# Patient Record
Sex: Female | Born: 1965 | Race: White | Hispanic: No | Marital: Single | Smoking: Former smoker
Health system: Southern US, Community
[De-identification: ages and names within clinical notes are randomized; demographics above are authoritative.]

## PROBLEM LIST (undated history)

## (undated) DIAGNOSIS — D259 Leiomyoma of uterus, unspecified: Secondary | ICD-10-CM

## (undated) DIAGNOSIS — E538 Deficiency of other specified B group vitamins: Secondary | ICD-10-CM

## (undated) DIAGNOSIS — G4730 Sleep apnea, unspecified: Secondary | ICD-10-CM

## (undated) DIAGNOSIS — I10 Essential (primary) hypertension: Secondary | ICD-10-CM

## (undated) DIAGNOSIS — J449 Chronic obstructive pulmonary disease, unspecified: Secondary | ICD-10-CM

## (undated) DIAGNOSIS — E785 Hyperlipidemia, unspecified: Secondary | ICD-10-CM

## (undated) HISTORY — DX: Hyperlipidemia, unspecified: E78.5

## (undated) HISTORY — DX: Leiomyoma of uterus, unspecified: D25.9

## (undated) HISTORY — PX: CHOLECYSTECTOMY: SHX55

## (undated) HISTORY — PX: HEMORROIDECTOMY: SUR656

## (undated) HISTORY — DX: Deficiency of other specified B group vitamins: E53.8

## (undated) HISTORY — DX: Chronic obstructive pulmonary disease, unspecified: J44.9

## (undated) HISTORY — PX: TENDON REPAIR: SHX5111

## (undated) HISTORY — PX: OTHER SURGICAL HISTORY: SHX169

## (undated) HISTORY — PX: ABDOMINAL HYSTERECTOMY: SHX81

---

## 1998-02-03 ENCOUNTER — Encounter: Payer: Self-pay | Admitting: Family Medicine

## 1998-02-03 ENCOUNTER — Inpatient Hospital Stay (HOSPITAL_COMMUNITY): Admission: AD | Admit: 1998-02-03 | Discharge: 1998-02-03 | Payer: Self-pay | Admitting: Family Medicine

## 2000-12-17 ENCOUNTER — Other Ambulatory Visit: Admission: RE | Admit: 2000-12-17 | Discharge: 2000-12-17 | Payer: Self-pay | Admitting: Family Medicine

## 2005-04-23 ENCOUNTER — Other Ambulatory Visit: Admission: RE | Admit: 2005-04-23 | Discharge: 2005-04-23 | Payer: Self-pay | Admitting: Family Medicine

## 2005-08-28 ENCOUNTER — Ambulatory Visit (HOSPITAL_COMMUNITY): Admission: RE | Admit: 2005-08-28 | Discharge: 2005-08-28 | Payer: Self-pay | Admitting: Family Medicine

## 2006-04-09 ENCOUNTER — Ambulatory Visit (HOSPITAL_COMMUNITY): Admission: RE | Admit: 2006-04-09 | Discharge: 2006-04-09 | Payer: Self-pay | Admitting: Family Medicine

## 2006-05-20 ENCOUNTER — Other Ambulatory Visit: Admission: RE | Admit: 2006-05-20 | Discharge: 2006-05-20 | Payer: Self-pay | Admitting: Family Medicine

## 2006-12-17 ENCOUNTER — Ambulatory Visit (HOSPITAL_COMMUNITY): Admission: RE | Admit: 2006-12-17 | Discharge: 2006-12-17 | Payer: Self-pay | Admitting: Family Medicine

## 2008-02-23 ENCOUNTER — Ambulatory Visit (HOSPITAL_COMMUNITY): Admission: RE | Admit: 2008-02-23 | Discharge: 2008-02-23 | Payer: Self-pay | Admitting: Family Medicine

## 2010-04-02 ENCOUNTER — Ambulatory Visit (HOSPITAL_COMMUNITY): Admission: RE | Admit: 2010-04-02 | Payer: Self-pay | Admitting: Family Medicine

## 2010-05-26 ENCOUNTER — Encounter: Payer: Self-pay | Admitting: Family Medicine

## 2010-05-27 ENCOUNTER — Encounter: Payer: Self-pay | Admitting: Family Medicine

## 2011-02-09 ENCOUNTER — Inpatient Hospital Stay (HOSPITAL_COMMUNITY): Admit: 2011-02-09 | Payer: BC Managed Care – PPO

## 2011-02-09 ENCOUNTER — Emergency Department (HOSPITAL_COMMUNITY)
Admit: 2011-02-09 | Discharge: 2011-02-09 | Disposition: A | Discharge status: Home / Self Care | Payer: BC Managed Care – PPO | Attending: Emergency Medicine | Admitting: Emergency Medicine

## 2011-02-09 ENCOUNTER — Encounter: Payer: Self-pay | Admitting: *Deleted

## 2011-02-09 ENCOUNTER — Emergency Department (HOSPITAL_COMMUNITY): Payer: BC Managed Care – PPO

## 2011-02-09 ENCOUNTER — Emergency Department (HOSPITAL_COMMUNITY)
Admission: EM | Admit: 2011-02-09 | Discharge: 2011-02-09 | Disposition: A | Discharge status: Home / Self Care | Payer: BC Managed Care – PPO | Attending: Emergency Medicine | Admitting: Emergency Medicine

## 2011-02-09 DIAGNOSIS — R110 Nausea: Secondary | ICD-10-CM | POA: Insufficient documentation

## 2011-02-09 DIAGNOSIS — R109 Unspecified abdominal pain: Secondary | ICD-10-CM | POA: Insufficient documentation

## 2011-02-09 HISTORY — DX: Essential (primary) hypertension: I10

## 2011-02-09 LAB — DIFFERENTIAL
Basophils Absolute: 0 10*3/uL (ref 0.0–0.1)
Basophils Relative: 0 % (ref 0–1)
Eosinophils Relative: 1 % (ref 0–5)
Lymphocytes Relative: 9 % — ABNORMAL LOW (ref 12–46)
Lymphs Abs: 1.3 10*3/uL (ref 0.7–4.0)
Monocytes Absolute: 0.7 10*3/uL (ref 0.1–1.0)
Monocytes Relative: 5 % (ref 3–12)
Neutrophils Relative %: 85 % — ABNORMAL HIGH (ref 43–77)

## 2011-02-09 LAB — COMPREHENSIVE METABOLIC PANEL
ALT: 23 U/L (ref 0–35)
Albumin: 3.5 g/dL (ref 3.5–5.2)
Alkaline Phosphatase: 92 U/L (ref 39–117)
CO2: 26 mEq/L (ref 19–32)
Chloride: 96 mEq/L (ref 96–112)
Potassium: 3.9 mEq/L (ref 3.5–5.1)
Total Bilirubin: 0.2 mg/dL — ABNORMAL LOW (ref 0.3–1.2)
Total Protein: 7.5 g/dL (ref 6.0–8.3)

## 2011-02-09 LAB — URINALYSIS, ROUTINE W REFLEX MICROSCOPIC
Bilirubin Urine: NEGATIVE
Hgb urine dipstick: NEGATIVE
Specific Gravity, Urine: >1.03 — ABNORMAL HIGH (ref 1.005–1.030)

## 2011-02-09 LAB — CBC
MCH: 29 pg (ref 26.0–34.0)
MCHC: 32.1 g/dL (ref 30.0–36.0)
RDW: 21.1 % — ABNORMAL HIGH (ref 11.5–15.5)

## 2011-02-09 MED ORDER — PROMETHAZINE HCL 25 MG PO TABS: 12.5000 mg | ORAL_TABLET | Freq: Four times a day (QID) | ORAL | Status: DC | PRN

## 2011-02-09 MED ORDER — HYDROCODONE-ACETAMINOPHEN 5-325 MG PO TABS: 2.0000 | ORAL_TABLET | ORAL | Status: AC | PRN

## 2011-02-09 MED ORDER — HYDROMORPHONE HCL 1 MG/ML IJ SOLN
1.0000 mg | Freq: Once | INTRAMUSCULAR | Status: AC
  Administered 2011-02-09: 1 mg via INTRAVENOUS
  Filled 2011-02-09: qty 1

## 2011-02-09 MED ORDER — ONDANSETRON HCL 4 MG/2ML IJ SOLN
4.0000 mg | Freq: Once | INTRAMUSCULAR | Status: AC
  Administered 2011-02-09: 4 mg via INTRAVENOUS
  Filled 2011-02-09: qty 2

## 2011-02-09 MED ORDER — ONDANSETRON HCL 4 MG/2ML IJ SOLN
4.0000 mg | Freq: Once | INTRAMUSCULAR | Status: AC
  Administered 2011-02-09: 4 mg via INTRAVENOUS

## 2011-02-09 MED ORDER — ONDANSETRON HCL 4 MG/2ML IJ SOLN
INTRAMUSCULAR | Status: AC
  Filled 2011-02-09: qty 2

## 2011-02-09 MED ORDER — SODIUM CHLORIDE 0.9 % IV SOLN
Freq: Once | INTRAVENOUS | Status: AC
  Administered 2011-02-09: 02:00:00 via INTRAVENOUS

## 2011-02-09 NOTE — ED Notes (Signed)
Patient with no complaints at this time. Respirations even and unlabored. Skin warm/dry. Discharge instructions reviewed with patient at this time. Patient given opportunity to voice concerns/ask questions. IV removed per policy and band-aid applied to site. Patient discharged at this time and left Emergency Department with steady gait.  

## 2011-02-09 NOTE — ED Notes (Signed)
Ultrasound at bedside

## 2011-02-09 NOTE — ED Notes (Signed)
Pt c/o right sided flank pain, abdominal pain, and nausea. Denies any urinary symptoms.

## 2011-02-09 NOTE — ED Provider Notes (Signed)
History     CSN: [id number] Arrival date & time: 02/09/2011  1:18 AM  Chief Complaint  Patient presents with  . Flank Pain  . Abdominal Pain  . Nausea    (Consider location/radiation/quality/duration/timing/severity/associated sxs/prior treatment) HPI  Past Medical History  Diagnosis Date  . Hypertension     Past Surgical History  Procedure Date  . Tumor removed   . Bladder stretching     Family History  Problem Relation Age of Onset  . Diabetes Other     History  Substance Use Topics  . Smoking status: Never Smoker   . Smokeless tobacco: Not on file  . Alcohol Use: Yes    OB History    Grav Para Term Preterm Abortions TAB SAB Ect Mult Living                  Review of Systems  Allergies  Sulfa antibiotics  Home Medications   Current Outpatient Rx  Name Route Sig Dispense Refill  . BUPROPION HCL (XL) 300 MG PO TB24 Oral Take 300 mg by mouth every morning.      . WELLBUTRIN PO Oral Take 300 mg by mouth.      . CYANOCOBALAMIN 1000 MCG/ML IJ SOLN Intramuscular Inject 1,000 mcg into the muscle every 30 (thirty) days.      Marland Kitchen PUREVIT DUALFE PLUS PO Oral Take 1 tablet by mouth daily. States she takes it everyday until they make her stomach upset. Then doesn't take it for a couple of days.    . IBUPROFEN 200 MG PO TABS Oral Take 800 mg by mouth every 6 (six) hours as needed. For pain     . OLMESARTAN MEDOXOMIL-HCTZ 40-25 MG PO TABS Oral Take 1 tablet by mouth every morning.     Marland Kitchen OMEPRAZOLE 20 MG PO CPDR Oral Take 20 mg by mouth as needed. For heartburn    . HYDROCODONE-ACETAMINOPHEN 5-325 MG PO TABS Oral Take 2 tablets by mouth every 4 (four) hours as needed for pain. 10 tablet 0  . PROMETHAZINE HCL 25 MG PO TABS Oral Take 0.5 tablets (12.5 mg total) by mouth every 6 (six) hours as needed for nausea. 10 tablet 0    BP 122/81  Pulse 91  Temp 98.4 F (36.9 C)  Resp 20  Ht 5\' 2"  (1.575 m)  Wt 238 lb (107.956 kg)  BMI 43.53 kg/m2  SpO2 95%  LMP  01/06/2011  Physical Exam  ED Course  Procedures (including critical care time)  Labs Reviewed  URINALYSIS, ROUTINE W REFLEX MICROSCOPIC - Abnormal; Notable for the following:    Specific Gravity, Urine >1.030 (*)    All other components within normal limits  CBC - Abnormal; Notable for the following:    WBC 14.6 (*)    Hemoglobin 11.7 (*)    RDW 21.1 (*)    Platelets 457 (*)    All other components within normal limits  DIFFERENTIAL - Abnormal; Notable for the following:    Neutrophils Relative 85 (*)    Neutro Abs 12.4 (*)    Lymphocytes Relative 9 (*)    All other components within normal limits  COMPREHENSIVE METABOLIC PANEL - Abnormal; Notable for the following:    Sodium 134 (*)    Glucose, Bld 119 (*)    Total Bilirubin 0.2 (*)    All other components within normal limits  LIPASE, BLOOD   US Abdomen Complete  02/09/2011  *RADIOLOGY REPORT*  Clinical Data:  Right upper quadrant pain.  COMPLETE ABDOMINAL ULTRASOUND  Comparison:  None.  Findings:  Gallbladder:  Biliary sludge is present.  No wall thickening or sonographic Murphy's sign.  No pericholecystic fluid.  Common bile duct:  Normal at 4 mm.  Liver:  Echogenic liver without focal mass lesions.  IVC:  Normal.  Pancreas:  obscured by overlying bowel gas.  Spleen:  97 mm.  Normal echotexture.  Right Kidney:  9.4 cm. Normal echotexture.  Normal central sinus echo complex.  No calculi or hydronephrosis.  Left Kidney:  12 cm. Normal echotexture.  Normal central sinus echo complex.  No calculi or hydronephrosis.  Abdominal aorta:  22 mm.  Mid and distal abdominal aorta obscured by bowel gas.  No visualized aneurysm.  IMPRESSION: Biliarysludge.  No cholecystitis.  Echogenic liver compatible with hepatic steatosis.  Original Report Authenticated By: Andreas Newport, M.D.     No diagnosis found.    MDM  Recheck at 0945: No acute abdomen. Discussed results of ultrasound. Will followup with Gen. surgery next week. Medications for  pain and nausea.        Donnetta Hutching, MD 02/09/11 [phone]

## 2011-02-09 NOTE — ED Notes (Signed)
Patient requesting pain medication and nausea medication. Dr. Adriana Simas made aware. Orders obtained.   Patient medicated and pt kept informed that she is waiting for ultrasound at 0900 today. Patient gave verbal understanding.

## 2011-02-09 NOTE — ED Notes (Signed)
Pt reports upper abdominal pain & right side pain that started yesterday. Pt also reports nausea but no vomiting.

## 2011-02-09 NOTE — ED Provider Notes (Signed)
History     CSN: [id number] Arrival date & time: 02/09/2011  1:18 AM  Chief Complaint  Patient presents with  . Flank Pain  . Abdominal Pain  . Nausea    (Consider location/radiation/quality/duration/timing/severity/associated sxs/prior treatment) HPI Comments: Seen 0155 Patient reports a similar pain attack two weeks ago that lasted several hours and resolved on its own. It was associated with nausea, no vomiting. Nothing she did then or tonight has made it better. Pain is continuous and makes her fel like she shouldn't move. Associated with nausea, no fevers, chills, diarrhea, chest pain, shortness of breath.  Patient is a 45 y.o. female presenting with abdominal pain. The history is provided by the patient and the spouse.  Abdominal Pain The primary symptoms of the illness include abdominal pain, nausea and vomiting. Primary symptoms comment: Sudden onset right flank pain with radiation to right abdomen and epigastric area that began at 7 pm The current episode started 3 to 5 hours ago. The onset of the illness was sudden. The problem has not changed since onset. The pain came on suddenly. The abdominal pain is located in the right flank. The abdominal pain radiates to the RUQ, RLQ and epigastric region. The severity of the abdominal pain is 8/10. The abdominal pain is relieved by nothing. The abdominal pain is exacerbated by vomiting.  The patient states that she believes she is currently not pregnant. The patient has not had a change in bowel habit. Symptoms associated with the illness do not include chills, diaphoresis, urgency, hematuria or frequency.    Past Medical History  Diagnosis Date  . Hypertension     Past Surgical History  Procedure Date  . Tumor removed   . Bladder stretching     Family History  Problem Relation Age of Onset  . Diabetes Other     History  Substance Use Topics  . Smoking status: Never Smoker   . Smokeless tobacco: Not on file  . Alcohol Use:  Yes    OB History    Grav Para Term Preterm Abortions TAB SAB Ect Mult Living                  Review of Systems  Constitutional: Negative for chills and diaphoresis.  Gastrointestinal: Positive for nausea, vomiting and abdominal pain.  Genitourinary: Negative for urgency, frequency and hematuria.  All other systems reviewed and are negative.    Allergies  Sulfa antibiotics  Home Medications   Current Outpatient Rx  Name Route Sig Dispense Refill  . WELLBUTRIN PO Oral Take 300 mg by mouth.      . PUREVIT DUALFE PLUS PO Oral Take by mouth.      . OLMESARTAN MEDOXOMIL-HCTZ 40-25 MG PO TABS Oral Take 1 tablet by mouth daily.      Marland Kitchen OMEPRAZOLE 20 MG PO CPDR Oral Take 20 mg by mouth as needed.        BP 140/82  Pulse 112  Temp 98.4 F (36.9 C)  Resp 20  Ht 5\' 2"  (1.575 m)  Wt 238 lb (107.956 kg)  BMI 43.53 kg/m2  SpO2 100%  LMP 01/06/2011  Physical Exam  Nursing note and vitals reviewed. Constitutional: She is oriented to person, place, and time. She appears well-developed and well-nourished. She appears distressed.  HENT:  Head: Normocephalic and atraumatic.  Eyes: EOM are normal.  Neck: Normal range of motion. Neck supple.  Cardiovascular: Normal rate, normal heart sounds and intact distal pulses.   Pulmonary/Chest: Effort normal and  breath sounds normal.  Abdominal: Soft. Bowel sounds are normal. There is tenderness. There is no rebound and no guarding.       RUQ pain with palpation, + murphy sign, no rebound.   Genitourinary:       No cva tenderness  Musculoskeletal: Normal range of motion.  Neurological: She is alert and oriented to person, place, and time. She has normal reflexes.  Skin: Skin is warm and dry.    ED Course  Procedures (including critical care time)  Results for orders placed during the hospital encounter of 02/09/11  URINALYSIS, ROUTINE W REFLEX MICROSCOPIC      Component Value Range   Color, Urine YELLOW  YELLOW    Appearance CLEAR   CLEAR    Specific Gravity, Urine >1.030 (*) 1.005 - 1.030    pH 5.5  5.0 - 8.0    Glucose, UA NEGATIVE  NEGATIVE (mg/dL)   Hgb urine dipstick NEGATIVE  NEGATIVE    Bilirubin Urine NEGATIVE  NEGATIVE    Ketones, ur NEGATIVE  NEGATIVE (mg/dL)   Protein, ur NEGATIVE  NEGATIVE (mg/dL)   Urobilinogen, UA 0.2  0.0 - 1.0 (mg/dL)   Nitrite NEGATIVE  NEGATIVE    Leukocytes, UA NEGATIVE  NEGATIVE   CBC      Component Value Range   WBC 14.6 (*) 4.0 - 10.5 (K/uL)   RBC 4.04  3.87 - 5.11 (MIL/uL)   Hemoglobin 11.7 (*) 12.0 - 15.0 (g/dL)   HCT 16.1  09.6 - 04.5 (%)   MCV 90.3  78.0 - 100.0 (fL)   MCH 29.0  26.0 - 34.0 (pg)   MCHC 32.1  30.0 - 36.0 (g/dL)   RDW 40.9 (*) 81.1 - 15.5 (%)   Platelets 457 (*) 150 - 400 (K/uL)  DIFFERENTIAL      Component Value Range   Neutrophils Relative 85 (*) 43 - 77 (%)   Neutro Abs 12.4 (*) 1.7 - 7.7 (K/uL)   Lymphocytes Relative 9 (*) 12 - 46 (%)   Lymphs Abs 1.3  0.7 - 4.0 (K/uL)   Monocytes Relative 5  3 - 12 (%)   Monocytes Absolute 0.7  0.1 - 1.0 (K/uL)   Eosinophils Relative 1  0 - 5 (%)   Eosinophils Absolute 0.2  0.0 - 0.7 (K/uL)   Basophils Relative 0  0 - 1 (%)   Basophils Absolute 0.0  0.0 - 0.1 (K/uL)  COMPREHENSIVE METABOLIC PANEL      Component Value Range   Sodium 134 (*) 135 - 145 (mEq/L)   Potassium 3.9  3.5 - 5.1 (mEq/L)   Chloride 96  96 - 112 (mEq/L)   CO2 26  19 - 32 (mEq/L)   Glucose, Bld 119 (*) 70 - 99 (mg/dL)   BUN 9  6 - 23 (mg/dL)   Creatinine, Ser 9.14  0.50 - 1.10 (mg/dL)   Calcium 9.9  8.4 - 78.2 (mg/dL)   Total Protein 7.5  6.0 - 8.3 (g/dL)   Albumin 3.5  3.5 - 5.2 (g/dL)   AST 17  0 - 37 (U/L)   ALT 23  0 - 35 (U/L)   Alkaline Phosphatase 92  39 - 117 (U/L)   Total Bilirubin 0.2 (*) 0.3 - 1.2 (mg/dL)   GFR calc non Af Amer >90  >90 (mL/min)   GFR calc Af Amer >90  >90 (mL/min)  LIPASE, BLOOD      Component Value Range   Lipase 15  11 - 59 (U/L)  Patient with onset of RUQ pain that has lasted for several  hours. Had a similar attack two weeks ago which resolved after several hours. PE c/w possible gall bladder dz. Labs with elevated wbc, normal lfts. Pain and nausea relieved with antiemetic and analgesic. [place] scheduled for later in the morning Patient requested she stay in the ER until [place] complete. She was moved to a quieter room to allow her to sleep. NPO pending [place].Pt feels improved after observation and/or treatment in ED. 0725 Care/disposition to Dr. Adriana Simas. Patient is waiting for [place] scheduled at 9 AM to r/o gall bladder disease. Referral to Dr. Lovell Sheehan provided on paperwork. VSS. Patient is pain free.       Nicoletta Dress. Colon Branch, MD 02/09/11 [phone]

## 2012-02-21 ENCOUNTER — Other Ambulatory Visit: Payer: Self-pay | Admitting: Nurse Practitioner

## 2012-02-21 DIAGNOSIS — Z139 Encounter for screening, unspecified: Secondary | ICD-10-CM

## 2012-02-27 ENCOUNTER — Ambulatory Visit (HOSPITAL_COMMUNITY)
Admission: RE | Admit: 2012-02-27 | Discharge: 2012-02-27 | Disposition: A | Discharge status: Home / Self Care | Payer: BC Managed Care – PPO | Source: Ambulatory Visit | Attending: Family Medicine | Admitting: Family Medicine

## 2012-02-27 DIAGNOSIS — Z1231 Encounter for screening mammogram for malignant neoplasm of breast: Secondary | ICD-10-CM | POA: Insufficient documentation

## 2012-02-27 DIAGNOSIS — Z139 Encounter for screening, unspecified: Secondary | ICD-10-CM

## 2012-08-14 ENCOUNTER — Other Ambulatory Visit: Payer: Self-pay | Admitting: Nurse Practitioner

## 2012-08-27 ENCOUNTER — Ambulatory Visit (INDEPENDENT_AMBULATORY_CARE_PROVIDER_SITE_OTHER): Payer: BC Managed Care – PPO | Admitting: Nurse Practitioner

## 2012-08-27 ENCOUNTER — Telehealth: Payer: Self-pay | Admitting: Family Medicine

## 2012-08-27 VITALS — BP 135/87 | HR 103 | Temp 97.6°F | Ht 62.0 in | Wt 236.0 lb

## 2012-08-27 DIAGNOSIS — F3289 Other specified depressive episodes: Secondary | ICD-10-CM

## 2012-08-27 DIAGNOSIS — F329 Major depressive disorder, single episode, unspecified: Secondary | ICD-10-CM

## 2012-08-27 DIAGNOSIS — F39 Unspecified mood [affective] disorder: Secondary | ICD-10-CM | POA: Insufficient documentation

## 2012-08-27 DIAGNOSIS — E538 Deficiency of other specified B group vitamins: Secondary | ICD-10-CM

## 2012-08-27 DIAGNOSIS — F32A Depression, unspecified: Secondary | ICD-10-CM

## 2012-08-27 DIAGNOSIS — E785 Hyperlipidemia, unspecified: Secondary | ICD-10-CM | POA: Insufficient documentation

## 2012-08-27 DIAGNOSIS — F411 Generalized anxiety disorder: Secondary | ICD-10-CM

## 2012-08-27 DIAGNOSIS — J209 Acute bronchitis, unspecified: Secondary | ICD-10-CM

## 2012-08-27 DIAGNOSIS — K219 Gastro-esophageal reflux disease without esophagitis: Secondary | ICD-10-CM

## 2012-08-27 MED ORDER — AMOXICILLIN 875 MG PO TABS: 875.0000 mg | ORAL_TABLET | Freq: Two times a day (BID) | ORAL | Status: DC

## 2012-08-27 MED ORDER — HYDROCODONE-HOMATROPINE 5-1.5 MG/5ML PO SYRP: 5.0000 mL | ORAL_SOLUTION | Freq: Three times a day (TID) | ORAL | Status: DC | PRN

## 2012-08-27 MED ORDER — PREDNISONE 20 MG PO TABS: ORAL_TABLET | ORAL | Status: DC

## 2012-08-27 MED ORDER — ALBUTEROL SULFATE HFA 108 (90 BASE) MCG/ACT IN AERS: 2.0000 | INHALATION_SPRAY | Freq: Four times a day (QID) | RESPIRATORY_TRACT | Status: DC | PRN

## 2012-08-27 NOTE — Progress Notes (Addendum)
  Subjective:    Patient ID: Hayley Crawford, female    DOB: 09/12/1965, 47 y.o.   MRN: [id number]  HPI-Patient in complaining of Cough and congestion . Started 6day. Has gotten worse since started. Associated symptoms include hoarse and sore throat. He has tried nightime cold relief OTC without relief. Patient had Amoxicillin left over and took 1 Monday, 2 Tuesday and 2 Wednesday.     Review of Systems  Constitutional: Negative for fever and chills.  HENT: Positive for ear pain, congestion, rhinorrhea, postnasal drip and sinus pressure.   Respiratory: Positive for cough (nonprodutive).   Cardiovascular: Negative.   Gastrointestinal: Negative.        Objective:   Physical Exam  Constitutional: She appears well-developed and well-nourished.  HENT:  Head: Normocephalic.  Right Ear: External ear normal.  Left Ear: External ear normal.  Nose: Mucosal edema and rhinorrhea present.  Mouth/Throat: Posterior oropharyngeal edema present.  Cardiovascular: Normal rate and normal heart sounds.   Pulmonary/Chest: Effort normal. She has wheezes.  Deep cough with exp wheezes throughout.  Abdominal: Soft. Bowel sounds are normal.  Skin: Skin is warm and dry.  Psychiatric: She has a normal mood and affect. Her behavior is normal. Judgment and thought content normal.  BP 135/87  Pulse 103  Temp(Src) 97.6 F (36.4 C) (Oral)  Ht 5\' 2"  (1.575 m)  Wt 236 lb (107.049 kg)  BMI 43.15 kg/m2  SpO2 95%         Assessment & Plan:  1. Acute bronchitis 1. Take meds as prescribed 2. Use a cool mist humidifier especially during the winter months and when heat has  been humid. 3. Use saline nose sprays frequently 4. Saline irrigations of the nose can be very helpful if done frequently.  * 4X daily for 1 week*  * Use of a nettie pot can be helpful with this. Follow directions with this* 5. Drink plenty of fluids 6. Keep thermostat turn down low 7.For any cough or congestion  Use plain Mucinex-  regular strength or max strength is fine   * Children- consult with Pharmacist for dosing 8. For fever or aces or pains- take tylenol or ibuprofen appropriate for age and weight.  * for fevers greater than 101 orally you may alternate ibuprofen and tylenol every  3 hours.  STOP SMOKING - amoxicillin (AMOXIL) 875 MG tablet; Take 1 tablet (875 mg total) by mouth 2 (two) times daily.  Dispense: 20 tablet; Refill: 0 - predniSONE (DELTASONE) 20 MG tablet; 2 PO qd X5 days  Dispense: 10 tablet; Refill: 0 - albuterol (PROVENTIL HFA;VENTOLIN HFA) 108 (90 BASE) MCG/ACT inhaler; Inhale 2 puffs into the lungs every 6 (six) hours as needed for wheezing.  Dispense: 1 Inhaler; Refill: 1 Mary-Margaret Daphine Deutscher, FNP

## 2012-08-27 NOTE — Addendum Note (Signed)
Addended by: Bennie Pierini on: 08/27/2012 06:10 PM   Modules accepted: Orders

## 2012-08-27 NOTE — Patient Instructions (Signed)
1. Acute bronchitis 1. Take meds as prescribed 2. Use a cool mist humidifier especially during the winter months and when heat has  been humid. 3. Use saline nose sprays frequently 4. Saline irrigations of the nose can be very helpful if done frequently.  * 4X daily for 1 week*  * Use of a nettie pot can be helpful with this. Follow directions with this* 5. Drink plenty of fluids 6. Keep thermostat turn down low 7.For any cough or congestion  Use plain Mucinex- regular strength or max strength is fine   * Children- consult with Pharmacist for dosing 8. For fever or aces or pains- take tylenol or ibuprofen appropriate for age and weight.  * for fevers greater than 101 orally you may alternate ibuprofen and tylenol every  3 hours.   - amoxicillin (AMOXIL) 875 MG tablet; Take 1 tablet (875 mg total) by mouth 2 (two) times daily.  Dispense: 20 tablet; Refill: 0 - predniSONE (DELTASONE) 20 MG tablet; 2 PO qd X5 days  Dispense: 10 tablet; Refill: 0 - albuterol (PROVENTIL HFA;VENTOLIN HFA) 108 (90 BASE) MCG/ACT inhaler; Inhale 2 puffs into the lungs every 6 (six) hours as needed for wheezing.  Dispense: 1 Inhaler; Refill: 1 Mary-Margaret Daphine Deutscher, FNP

## 2012-08-27 NOTE — Telephone Encounter (Signed)
PM APPT MADE

## 2012-09-02 ENCOUNTER — Other Ambulatory Visit: Payer: Self-pay | Admitting: *Deleted

## 2012-09-02 MED ORDER — HYDROCODONE-HOMATROPINE 5-1.5 MG/5ML PO SYRP: 5.0000 mL | ORAL_SOLUTION | Freq: Three times a day (TID) | ORAL | Status: DC | PRN

## 2012-09-02 NOTE — Telephone Encounter (Signed)
Patient originally prescribed on 4-24 at office visit. Please advise. If approved please have nurse phone in to [facility]. Thank you

## 2012-09-07 ENCOUNTER — Other Ambulatory Visit: Payer: Self-pay

## 2012-09-07 MED ORDER — HYDROCODONE-HOMATROPINE 5-1.5 MG/5ML PO SYRP: 5.0000 mL | ORAL_SOLUTION | Freq: Three times a day (TID) | ORAL | Status: DC | PRN

## 2012-09-07 NOTE — Telephone Encounter (Signed)
Last filled 09/02/12  Last seen 07/15/12   If approved print RX and have nurse call patient to pick up

## 2012-10-26 ENCOUNTER — Ambulatory Visit: Payer: BC Managed Care – PPO

## 2012-10-26 ENCOUNTER — Telehealth: Payer: Self-pay | Admitting: Nurse Practitioner

## 2012-10-26 NOTE — Telephone Encounter (Signed)
appt made for pm clinic 

## 2012-11-26 ENCOUNTER — Other Ambulatory Visit: Payer: Self-pay

## 2012-11-26 MED ORDER — OMEPRAZOLE 20 MG PO CPDR: 20.0000 mg | DELAYED_RELEASE_CAPSULE | ORAL | Status: DC | PRN

## 2013-01-13 ENCOUNTER — Ambulatory Visit (INDEPENDENT_AMBULATORY_CARE_PROVIDER_SITE_OTHER): Payer: BC Managed Care – PPO | Admitting: General Practice

## 2013-01-13 ENCOUNTER — Telehealth: Payer: Self-pay | Admitting: Nurse Practitioner

## 2013-01-13 ENCOUNTER — Encounter: Payer: Self-pay | Admitting: General Practice

## 2013-01-13 VITALS — BP 125/77 | HR 89 | Temp 97.2°F | Ht 62.0 in | Wt 253.0 lb

## 2013-01-13 DIAGNOSIS — R0602 Shortness of breath: Secondary | ICD-10-CM

## 2013-01-13 DIAGNOSIS — J322 Chronic ethmoidal sinusitis: Secondary | ICD-10-CM

## 2013-01-13 DIAGNOSIS — J209 Acute bronchitis, unspecified: Secondary | ICD-10-CM

## 2013-01-13 MED ORDER — PREDNISONE (PAK) 10 MG PO TABS: ORAL_TABLET | ORAL | Status: DC

## 2013-01-13 MED ORDER — AZITHROMYCIN 250 MG PO TABS
ORAL_TABLET | ORAL | Status: DC
Start: 2013-01-13 — End: 2013-04-09

## 2013-01-13 NOTE — Patient Instructions (Signed)

## 2013-01-13 NOTE — Telephone Encounter (Signed)
APPT MADE

## 2013-01-13 NOTE — Progress Notes (Signed)
  Subjective:    Patient ID: Hayley Crawford, female    DOB: 02/24/1966, 47 y.o.   MRN: [id number]  Shortness of Breath This is a new problem. The current episode started in the past 7 days. The problem occurs constantly. The problem has been gradually worsening. Associated symptoms include a sore throat and wheezing. Pertinent negatives include no abdominal pain, chest pain, fever, headaches, leg swelling, neck pain, rhinorrhea or vomiting. The symptoms are aggravated by any activity and smoke. Risk factors include smoking. She has tried rest and cool air for the symptoms. The treatment provided mild relief.      Review of Systems  Constitutional: Negative for fever and chills.  HENT: Positive for sore throat. Negative for rhinorrhea, neck pain and neck stiffness.   Respiratory: Positive for cough, chest tightness, shortness of breath and wheezing.   Cardiovascular: Negative for chest pain, palpitations and leg swelling.  Gastrointestinal: Negative for nausea, vomiting and abdominal pain.  Neurological: Negative for dizziness, weakness and headaches.       Objective:   Physical Exam  Constitutional: She is oriented to person, place, and time. She appears well-developed and well-nourished.  HENT:  Head: Normocephalic and atraumatic.  Right Ear: External ear normal.  Left Ear: External ear normal.  Nose: Right sinus exhibits frontal sinus tenderness. Right sinus exhibits no maxillary sinus tenderness. Left sinus exhibits frontal sinus tenderness. Left sinus exhibits no maxillary sinus tenderness.  Cardiovascular: Normal rate, regular rhythm and normal heart sounds.   Pulmonary/Chest: Effort normal and breath sounds normal. No respiratory distress. She exhibits no tenderness.  Neurological: She is alert and oriented to person, place, and time.  Skin: Skin is warm and dry.  Psychiatric: She has a normal mood and affect.          Assessment & Plan:  1. Shortness of breath  2. Acute  bronchitis - predniSONE (STERAPRED UNI-PAK) 10 MG tablet; Take as directed  Dispense: 21 tablet; Refill: 0 -Continue using albuterol inhaler as directed  3. Ethmoid sinusitis - azithromycin (ZITHROMAX) 250 MG tablet; Take as directed  Dispense: 6 tablet; Refill: 0 -increase fluids -RTO if symptoms worsen or unresolved -Patient verbalized understanding Coralie Keens, FNP-C

## 2013-02-09 ENCOUNTER — Other Ambulatory Visit: Payer: Self-pay

## 2013-02-09 MED ORDER — OLMESARTAN MEDOXOMIL-HCTZ 40-25 MG PO TABS: 1.0000 | ORAL_TABLET | Freq: Every day | ORAL | Status: DC

## 2013-03-02 ENCOUNTER — Other Ambulatory Visit: Payer: Self-pay | Admitting: Family Medicine

## 2013-03-02 DIAGNOSIS — Z139 Encounter for screening, unspecified: Secondary | ICD-10-CM

## 2013-03-08 ENCOUNTER — Other Ambulatory Visit: Payer: Self-pay | Admitting: General Practice

## 2013-03-09 ENCOUNTER — Ambulatory Visit (HOSPITAL_COMMUNITY)
Admission: RE | Admit: 2013-03-09 | Discharge: 2013-03-09 | Disposition: A | Discharge status: Home / Self Care | Payer: BC Managed Care – PPO | Source: Ambulatory Visit | Attending: Family Medicine | Admitting: Family Medicine

## 2013-03-09 DIAGNOSIS — Z139 Encounter for screening, unspecified: Secondary | ICD-10-CM

## 2013-03-09 DIAGNOSIS — Z1231 Encounter for screening mammogram for malignant neoplasm of breast: Secondary | ICD-10-CM | POA: Insufficient documentation

## 2013-04-02 ENCOUNTER — Other Ambulatory Visit: Payer: BC Managed Care – PPO | Admitting: General Practice

## 2013-04-09 ENCOUNTER — Other Ambulatory Visit: Payer: Self-pay | Admitting: General Practice

## 2013-04-09 ENCOUNTER — Ambulatory Visit (INDEPENDENT_AMBULATORY_CARE_PROVIDER_SITE_OTHER): Payer: BC Managed Care – PPO | Admitting: General Practice

## 2013-04-09 ENCOUNTER — Encounter: Payer: Self-pay | Admitting: General Practice

## 2013-04-09 ENCOUNTER — Ambulatory Visit (INDEPENDENT_AMBULATORY_CARE_PROVIDER_SITE_OTHER): Payer: BC Managed Care – PPO

## 2013-04-09 VITALS — BP 118/81 | HR 91 | Temp 99.3°F | Ht 62.0 in | Wt 236.0 lb

## 2013-04-09 DIAGNOSIS — Z01419 Encounter for gynecological examination (general) (routine) without abnormal findings: Secondary | ICD-10-CM

## 2013-04-09 DIAGNOSIS — R0781 Pleurodynia: Secondary | ICD-10-CM

## 2013-04-09 DIAGNOSIS — Z0000 Encounter for general adult medical examination without abnormal findings: Secondary | ICD-10-CM

## 2013-04-09 DIAGNOSIS — Z124 Encounter for screening for malignant neoplasm of cervix: Secondary | ICD-10-CM

## 2013-04-09 DIAGNOSIS — R079 Chest pain, unspecified: Secondary | ICD-10-CM

## 2013-04-09 DIAGNOSIS — I10 Essential (primary) hypertension: Secondary | ICD-10-CM

## 2013-04-09 LAB — POCT CBC
Lymph, poc: 2.4 (ref 0.6–3.4)
MCH, POC: 22.5 pg — AB (ref 27–31.2)
MCHC: 30 g/dL — AB (ref 31.8–35.4)
MCV: 75.1 fL — AB (ref 80–97)
MPV: 6.5 fL (ref 0–99.8)
POC LYMPH PERCENT: 22.9 %L (ref 10–50)
Platelet Count, POC: 494 10*3/uL — AB (ref 142–424)
RDW, POC: 18.4 %
WBC: 10.4 10*3/uL — AB (ref 4.6–10.2)

## 2013-04-09 NOTE — Progress Notes (Signed)
   Subjective:    Patient ID: Hayley Crawford, female    DOB: [birth year], 47 y.o.   MRN: [id number]  HPI Patient presents today for annual exam. She denies eating healthy or regular exercise. Smoking 2 ppd and verbalizes being ready to decrease amount, but not quit.     Review of Systems  Constitutional: Negative for fever and chills.  Respiratory: Negative for chest tightness and shortness of breath.   Cardiovascular: Negative for chest pain and palpitations.  Gastrointestinal: Negative for nausea, vomiting, abdominal pain, diarrhea, constipation and blood in stool.  Genitourinary: Negative for dysuria, urgency, frequency, hematuria, vaginal discharge, difficulty urinating and vaginal pain.  Neurological: Negative for dizziness, weakness and headaches.       Objective:   Physical Exam  Constitutional: She is oriented to person, place, and time. She appears well-developed and well-nourished.  HENT:  Head: Normocephalic and atraumatic.  Right Ear: External ear normal.  Left Ear: External ear normal.  Mouth/Throat: Oropharynx is clear and moist.  Eyes: Conjunctivae and EOM are normal. Pupils are equal, round, and reactive to light.  Neck: Normal range of motion. Neck supple. No thyromegaly present.  Cardiovascular: Normal rate, regular rhythm, normal heart sounds and intact distal pulses.   Pulmonary/Chest: Effort normal and breath sounds normal. No respiratory distress. Right breast exhibits no inverted nipple, no mass, no nipple discharge, no skin change and no tenderness. Left breast exhibits no inverted nipple, no mass, no nipple discharge, no skin change and no tenderness. Breasts are symmetrical.  Abdominal: Soft. Bowel sounds are normal. She exhibits no distension.  Genitourinary: Vagina normal and uterus normal. No breast swelling, tenderness, discharge or bleeding. No labial fusion. There is no rash, tenderness, lesion or injury on the right labia. There is no rash, tenderness,  lesion or injury on the left labia. Uterus is not deviated, not enlarged, not fixed and not tender. Cervix exhibits no motion tenderness, no discharge and no friability. Right adnexum displays no mass, no tenderness and no fullness. Left adnexum displays no mass, no tenderness and no fullness. No erythema, tenderness or bleeding around the vagina. No foreign body around the vagina. No signs of injury around the vagina. No vaginal discharge Crawford.  Lymphadenopathy:    She has no cervical adenopathy.  Neurological: She is alert and oriented to person, place, and time.  Skin: Skin is warm and dry.  Psychiatric: She has a normal mood and affect.   [facility] reading (PRIMARY) by Coralie Keens, FNP-C, no fracture or dislocation noted.                                    Assessment & Plan:  1. Hypertension  - NMR, lipoprofile  2. Annual physical exam  - POCT CBC - CMP14+EGFR - Pap IG w/ reflex to HPV when ASC-U -discussed healthy eating and regular exercise  3. Rib pain on left side RTO if symptoms worsen or unresovled Patient verbalized understanding Coralie Keens, FNP-C

## 2013-04-10 LAB — CMP14+EGFR
ALT: 20 IU/L (ref 0–32)
AST: 15 IU/L (ref 0–40)
Alkaline Phosphatase: 92 IU/L (ref 39–117)
BUN/Creatinine Ratio: 13 (ref 9–23)
CO2: 25 mmol/L (ref 18–29)
Chloride: 98 mmol/L (ref 97–108)
Creatinine, Ser: 0.86 mg/dL (ref 0.57–1.00)
Globulin, Total: 2.9 g/dL (ref 1.5–4.5)
Potassium: 4.1 mmol/L (ref 3.5–5.2)
Sodium: 140 mmol/L (ref 134–144)

## 2013-04-10 LAB — NMR, LIPOPROFILE
HDL Particle Number: 38.6 umol/L (ref >=30.5)
LDLC SERPL CALC-MCNC: 109 mg/dL — ABNORMAL HIGH (ref <100)
LP-IR Score: 80 — ABNORMAL HIGH (ref <=45)

## 2013-04-13 LAB — PAP IG W/ RFLX HPV ASCU: PAP Smear Comment: 0

## 2013-04-19 ENCOUNTER — Other Ambulatory Visit: Payer: Self-pay | Admitting: General Practice

## 2013-05-03 ENCOUNTER — Telehealth: Payer: Self-pay | Admitting: Family Medicine

## 2013-05-03 NOTE — Telephone Encounter (Signed)
Message copied by Azalee Course on Mon May 03, 2013  1:58 PM ------      Message from: Carl Best, [place] E      Created: Mon Apr 19, 2013  8:11 AM       Please inform that LDL is elevated. LDL is lowered by healthy eating, regular exercise, and lipid lowering agent. If she is able to tolerate statin, will call lipitor into pharmacy. Pap was negative. ------

## 2013-05-24 ENCOUNTER — Encounter (INDEPENDENT_AMBULATORY_CARE_PROVIDER_SITE_OTHER): Payer: Self-pay

## 2013-05-24 ENCOUNTER — Encounter: Payer: Self-pay | Admitting: Family Medicine

## 2013-05-24 ENCOUNTER — Ambulatory Visit (INDEPENDENT_AMBULATORY_CARE_PROVIDER_SITE_OTHER): Payer: BC Managed Care – PPO | Admitting: Family Medicine

## 2013-05-24 VITALS — BP 136/69 | HR 99 | Temp 99.8°F | Ht 62.0 in | Wt 243.6 lb

## 2013-05-24 DIAGNOSIS — R05 Cough: Secondary | ICD-10-CM

## 2013-05-24 DIAGNOSIS — E785 Hyperlipidemia, unspecified: Secondary | ICD-10-CM

## 2013-05-24 DIAGNOSIS — I10 Essential (primary) hypertension: Secondary | ICD-10-CM | POA: Insufficient documentation

## 2013-05-24 DIAGNOSIS — J441 Chronic obstructive pulmonary disease with (acute) exacerbation: Secondary | ICD-10-CM

## 2013-05-24 DIAGNOSIS — J209 Acute bronchitis, unspecified: Secondary | ICD-10-CM | POA: Insufficient documentation

## 2013-05-24 DIAGNOSIS — R059 Cough, unspecified: Secondary | ICD-10-CM | POA: Insufficient documentation

## 2013-05-24 DIAGNOSIS — R52 Pain, unspecified: Secondary | ICD-10-CM | POA: Insufficient documentation

## 2013-05-24 LAB — POCT INFLUENZA A/B
Influenza A, POC: NEGATIVE
Influenza B, POC: NEGATIVE

## 2013-05-24 MED ORDER — FLUTICASONE-SALMETEROL 250-50 MCG/DOSE IN AEPB: 1.0000 | INHALATION_SPRAY | Freq: Two times a day (BID) | RESPIRATORY_TRACT | Status: DC

## 2013-05-24 MED ORDER — AMOXICILLIN 875 MG PO TABS: 875.0000 mg | ORAL_TABLET | Freq: Two times a day (BID) | ORAL | Status: DC

## 2013-05-24 MED ORDER — OSELTAMIVIR PHOSPHATE 75 MG PO CAPS: 75.0000 mg | ORAL_CAPSULE | Freq: Two times a day (BID) | ORAL | Status: DC

## 2013-05-24 MED ORDER — GUAIFENESIN-CODEINE 100-10 MG/5ML PO SYRP: 5.0000 mL | ORAL_SOLUTION | Freq: Three times a day (TID) | ORAL | Status: DC | PRN

## 2013-05-24 NOTE — Progress Notes (Signed)
Patient ID: Hayley Crawford, female   DOB: 05-Aug-1965, 48 y.o.   MRN: [id number] SUBJECTIVE: CC: Chief Complaint  Patient presents with  . Cough    cough and achy     HPI: Acute Bronchitis Patient presents for evaluation of nasal congestion, nonproductive cough and sneezing. Symptoms began 3 days ago and are unchanged since that time. Past history is significant for COPD. Was smoking 2 PPD and stopped and using nicotine patches.   Past Medical History  Diagnosis Date  . Hypertension    Past Surgical History  Procedure Laterality Date  . Tumor removed    . Bladder stretching     History   Social History  . Marital Status: Divorced    Spouse Name: N/A    Number of Children: N/A  . Years of Education: N/A   Occupational History  . Not on file.   Social History Main Topics  . Smoking status: Current Every Day Smoker  . Smokeless tobacco: Not on file  . Alcohol Use: Yes  . Drug Use: No  . Sexual Activity: Not on file   Other Topics Concern  . Not on file   Social History Narrative  . No narrative on file   Family History  Problem Relation Age of Onset  . Diabetes Other   . Cancer Mother     lung  . Brain cancer Mother     tumor   Current Outpatient Prescriptions on File Prior to Visit  Medication Sig Dispense Refill  . albuterol (PROVENTIL HFA;VENTOLIN HFA) 108 (90 BASE) MCG/ACT inhaler Inhale 2 puffs into the lungs every 6 (six) hours as needed for wheezing.  1 Inhaler  1  . cyanocobalamin (,VITAMIN B-12,) 1000 MCG/ML injection Inject 1,000 mcg into the muscle every 30 (thirty) days.        . FeFum-FePo-FA-B Cmp-C-Zn-Mn-Cu (PUREVIT DUALFE PLUS PO) Take 1 tablet by mouth daily. States she takes it everyday until they make her stomach upset. Then doesn't take it for a couple of days.      Marland Kitchen ibuprofen (ADVIL,MOTRIN) 200 MG tablet Take 800 mg by mouth every 6 (six) hours as needed. For pain       . meloxicam (MOBIC) 15 MG tablet Take 15 mg by mouth daily.      Marland Kitchen  olmesartan-hydrochlorothiazide (BENICAR HCT) 40-25 MG per tablet Take 1 tablet by mouth daily.  30 tablet  4  . omeprazole (PRILOSEC) 20 MG capsule TAKE 1 CAPSULE (20 MG TOTAL)  BY MOUTH AS NEEDED. FOR HEARTBURN  30 capsule  3   No current facility-administered medications on file prior to visit.   Allergies  Allergen Reactions  . Sulfa Antibiotics Swelling    "think it caused my throat to swell"   Immunization History  Administered Date(s) Administered  . Influenza,inj,Quad PF,36+ Mos 03/01/2013   Prior to Admission medications   Medication Sig Start Date End Date Taking? Authorizing Provider  albuterol (PROVENTIL HFA;VENTOLIN HFA) 108 (90 BASE) MCG/ACT inhaler Inhale 2 puffs into the lungs every 6 (six) hours as needed for wheezing. 08/27/12   Mary-Margaret Hassell Done, FNP  cyanocobalamin (,VITAMIN B-12,) 1000 MCG/ML injection Inject 1,000 mcg into the muscle every 30 (thirty) days.      Historical Provider, MD  FeFum-FePo-FA-B Cmp-C-Zn-Mn-Cu (PUREVIT DUALFE PLUS PO) Take 1 tablet by mouth daily. States she takes it everyday until they make her stomach upset. Then doesn't take it for a couple of days.    Historical Provider, MD  ibuprofen (ADVIL,MOTRIN) 200  MG tablet Take 800 mg by mouth every 6 (six) hours as needed. For pain     Historical Provider, MD  meloxicam (MOBIC) 15 MG tablet Take 15 mg by mouth daily.    Historical Provider, MD  olmesartan-hydrochlorothiazide (BENICAR HCT) 40-25 MG per tablet Take 1 tablet by mouth daily. 02/09/13   Chipper Herb, MD  omeprazole (PRILOSEC) 20 MG capsule TAKE 1 CAPSULE (20 MG TOTAL)  BY MOUTH AS NEEDED. FOR HEARTBURN 03/08/13   Mary-Margaret Hassell Done, FNP     ROS: As above in the HPI. All other systems are stable or negative.  OBJECTIVE: APPEARANCE:  Patient in no acute distress.The patient appeared well nourished and normally developed. Acyanotic. Waist: VITAL SIGNS:BP 136/69  Pulse 99  Temp(Src) 99.8 F (37.7 C) (Oral)  Ht 5\' 2"  (1.575 m)   Wt 243 lb 9.6 oz (110.496 kg)  BMI 44.54 kg/m2  SpO2 99%  LMP 04/29/2013 Obese WF  SKIN: warm and  Dry without overt rashes, tattoos and scars  HEAD and Neck: without JVD, Head and scalp: normal Eyes:No scleral icterus. Fundi normal, eye movements normal. Ears: Auricle normal, canal normal, Tympanic membranes normal, insufflation normal. Nose: normal Throat: normal Neck & thyroid: normal  CHEST & LUNGS: Chest wall: normal Lungs: Coarse rhonchi.no Rales no wheezes but has prolonged expiratory phase.   CVS: Reveals the PMI to be normally located. Regular rhythm, First and Second Heart sounds are normal,  absence of murmurs, rubs or gallops. Peripheral vasculature: Radial pulses: normal Dorsal pedis pulses: normal Posterior pulses: normal  ABDOMEN:  Appearance: Obese Benign, no organomegaly, no masses, no Abdominal Aortic enlargement. No Guarding , no rebound. No Bruits. Bowel sounds: normal  RECTAL: N/A GU: N/A  EXTREMETIES: nonedematous.  MUSCULOSKELETAL:  Spine: normal Joints: intact  NEUROLOGIC: oriented to time,place and person; nonfocal. Strength is normal Sensory is normal Reflexes are normal Cranial Nerves are normal.  ASSESSMENT: Acute bronchitis - Plan: amoxicillin (AMOXIL) 875 MG tablet  Cough - Plan: POCT Influenza A/B, amoxicillin (AMOXIL) 875 MG tablet, guaiFENesin-codeine (ROBITUSSIN AC) 100-10 MG/5ML syrup  Aches - Plan: POCT Influenza A/B, oseltamivir (TAMIFLU) 75 MG capsule  Hypertension  Other and unspecified hyperlipidemia  COPD exacerbation - Plan: Fluticasone-Salmeterol (ADVAIR) 250-50 MCG/DOSE AEPB  PLAN:  Orders Placed This Encounter  Procedures  . POCT Influenza A/B   Results for orders placed in visit on 05/24/13  POCT INFLUENZA A/B      Result Value Range   Influenza A, POC Negative     Influenza B, POC Negative       Meds ordered this encounter  Medications  . busPIRone (BUSPAR) 10 MG tablet    Sig: Take 10 mg  by mouth 3 (three) times daily.  Marland Kitchen amoxicillin (AMOXIL) 875 MG tablet    Sig: Take 1 tablet (875 mg total) by mouth 2 (two) times daily.    Dispense:  20 tablet    Refill:  0  . oseltamivir (TAMIFLU) 75 MG capsule    Sig: Take 1 capsule (75 mg total) by mouth 2 (two) times daily.    Dispense:  10 capsule    Refill:  0  . Fluticasone-Salmeterol (ADVAIR) 250-50 MCG/DOSE AEPB    Sig: Inhale 1 puff into the lungs 2 (two) times daily.    Dispense:  60 each    Refill:  1  . guaiFENesin-codeine (ROBITUSSIN AC) 100-10 MG/5ML syrup    Sig: Take 5 mLs by mouth 3 (three) times daily as needed for cough.  Dispense:  120 mL    Refill:  0   Smoking cessation.counseled.  Praised her on using the nicotine patches. Handouts in the AVS on smoking cessation and COPD. demonstarted use of the advair.  There are no discontinued medications. Return in about 2 weeks (around 06/07/2013) for Recheck medical problems.  Colbie Sliker P. Jacelyn Grip, M.D.

## 2013-05-24 NOTE — Patient Instructions (Signed)
Smoking Cessation Quitting smoking is important to your health and has many advantages. However, it is not always easy to quit since nicotine is a very addictive drug. Often times, people try 3 times or more before being able to quit. This document explains the best ways for you to prepare to quit smoking. Quitting takes hard work and a lot of effort, but you can do it. ADVANTAGES OF QUITTING SMOKING  You will live longer, feel better, and live better.  Your body will feel the impact of quitting smoking almost immediately.  Within 20 minutes, blood pressure decreases. Your pulse returns to its normal level.  After 8 hours, carbon monoxide levels in the blood return to normal. Your oxygen level increases.  After 24 hours, the chance of having a heart attack starts to decrease. Your breath, hair, and body stop smelling like smoke.  After 48 hours, damaged nerve endings begin to recover. Your sense of taste and smell improve.  After 72 hours, the body is virtually free of nicotine. Your bronchial tubes relax and breathing becomes easier.  After 2 to 12 weeks, lungs can hold more air. Exercise becomes easier and circulation improves.  The risk of having a heart attack, stroke, cancer, or lung disease is greatly reduced.  After 1 year, the risk of coronary heart disease is cut in half.  After 5 years, the risk of stroke falls to the same as a nonsmoker.  After 10 years, the risk of lung cancer is cut in half and the risk of other cancers decreases significantly.  After 15 years, the risk of coronary heart disease drops, usually to the level of a nonsmoker.  If you are pregnant, quitting smoking will improve your chances of having a healthy baby.  The people you live with, especially any children, will be healthier.  You will have extra money to spend on things other than cigarettes. QUESTIONS TO THINK ABOUT BEFORE ATTEMPTING TO QUIT You may want to talk about your answers with your  caregiver.  Why do you want to quit?  If you tried to quit in the past, what helped and what did not?  What will be the most difficult situations for you after you quit? How will you plan to handle them?  Who can help you through the tough times? Your family? Friends? A caregiver?  What pleasures do you get from smoking? What ways can you still get pleasure if you quit? Here are some questions to ask your caregiver:  How can you help me to be successful at quitting?  What medicine do you think would be best for me and how should I take it?  What should I do if I need more help?  What is smoking withdrawal like? How can I get information on withdrawal? GET READY  Set a quit date.  Change your environment by getting rid of all cigarettes, ashtrays, matches, and lighters in your home, car, or work. Do not let people smoke in your home.  Review your past attempts to quit. Think about what worked and what did not. GET SUPPORT AND ENCOURAGEMENT You have a better chance of being successful if you have help. You can get support in many ways.  Tell your family, friends, and co-workers that you are going to quit and need their support. Ask them not to smoke around you.  Get individual, group, or telephone counseling and support. Programs are available at local hospitals and health centers. Call your local health department for   information about programs in your area. °· Spiritual beliefs and practices may help some smokers quit. °· Download a "quit meter" on your computer to keep track of quit statistics, such as how long you have gone without smoking, cigarettes not smoked, and money saved. °· Get a self-help book about quitting smoking and staying off of tobacco. °LEARN NEW SKILLS AND BEHAVIORS °· Distract yourself from urges to smoke. Talk to someone, go for a walk, or occupy your time with a task. °· Change your normal routine. Take a different route to work. Drink tea instead of coffee.  Eat breakfast in a different place. °· Reduce your stress. Take a hot bath, exercise, or read a book. °· Plan something enjoyable to do every day. Reward yourself for not smoking. °· Explore interactive web-based programs that specialize in helping you quit. °GET MEDICINE AND USE IT CORRECTLY °Medicines can help you stop smoking and decrease the urge to smoke. Combining medicine with the above behavioral methods and support can greatly increase your chances of successfully quitting smoking. °· Nicotine replacement therapy helps deliver nicotine to your body without the negative effects and risks of smoking. Nicotine replacement therapy includes nicotine gum, lozenges, inhalers, nasal sprays, and skin patches. Some may be available over-the-counter and others require a prescription. °· Antidepressant medicine helps people abstain from smoking, but how this works is unknown. This medicine is available by prescription. °· Nicotinic receptor partial agonist medicine simulates the effect of nicotine in your brain. This medicine is available by prescription. °Ask your caregiver for advice about which medicines to use and how to use them based on your health history. Your caregiver will tell you what side effects to look out for if you choose to be on a medicine or therapy. Carefully read the information on the package. Do not use any other product containing nicotine while using a nicotine replacement product.  °RELAPSE OR DIFFICULT SITUATIONS °Most relapses occur within the first 3 months after quitting. Do not be discouraged if you start smoking again. Remember, most people try several times before finally quitting. You may have symptoms of withdrawal because your body is used to nicotine. You may crave cigarettes, be irritable, feel very hungry, cough often, get headaches, or have difficulty concentrating. The withdrawal symptoms are only temporary. They are strongest when you first quit, but they will go away within  10 14 days. °To reduce the chances of relapse, try to: °· Avoid drinking alcohol. Drinking lowers your chances of successfully quitting. °· Reduce the amount of caffeine you consume. Once you quit smoking, the amount of caffeine in your body increases and can give you symptoms, such as a rapid heartbeat, sweating, and anxiety. °· Avoid smokers because they can make you want to smoke. °· Do not let weight gain distract you. Many smokers will gain weight when they quit, usually less than 10 pounds. Eat a healthy diet and stay active. You can always lose the weight gained after you quit. °· Find ways to improve your mood other than smoking. °FOR MORE INFORMATION  °www.smokefree.gov  °Document Released: 04/16/2001 Document Revised: 10/22/2011 Document Reviewed: 08/01/2011 °ExitCare® Patient Information ©2014 [employer]. °Chronic Obstructive Pulmonary Disease °Chronic obstructive pulmonary disease (COPD) is a common lung condition in which airflow from the lungs is limited. COPD is a general term that can be used to describe many different lung problems that limit airflow, including both chronic bronchitis and emphysema.  If you have COPD, your lung function will probably never return to   normal, but there are measures you can take to improve lung function and make yourself feel better.  °CAUSES  °· Smoking (common).   °· Exposure to secondhand smoke.   °· Genetic problems. °· Chronic inflammatory lung diseases or recurrent infections. °SYMPTOMS  °· Shortness of breath, especially with physical activity.   °· Deep, persistent (chronic) cough with a large amount of thick mucus.   °· Wheezing.   °· Rapid breaths (tachypnea).   °· Gray or bluish discoloration (cyanosis) of the skin, especially in fingers, toes, or lips.   °· Fatigue.   °· Weight loss.   °· Frequent infections or episodes when breathing symptoms become much worse (exacerbations).   °· Chest tightness. °DIAGNOSIS  °Your healthcare provider will take a  medical history and perform a physical examination to make the initial diagnosis.  Additional tests for COPD may include:  °· Lung (pulmonary) function tests. °· Chest X-ray. °· CT scan. °· Blood tests. °TREATMENT  °Treatment available to help you feel better when you have COPD include:  °· Inhaler and nebulizer medicines. These help manage the symptoms of COPD and make your breathing more comfortable °· Supplemental oxygen. Supplemental oxygen is only helpful if you have a low oxygen level in your blood.   °· Exercise and physical activity. These are beneficial for nearly all people with COPD. Some people may also benefit from a pulmonary rehabilitation program. °HOME CARE INSTRUCTIONS  °· Take all medicines (inhaled or pills) as directed by your health care provider. °· Only take over-the-counter or prescription medicines for pain, fever, or discomfort as directed by your health care provider.   °· Avoid over-the-counter medicines or cough syrups that dry up your airway (such as antihistamines) and slow down the elimination of secretions unless instructed otherwise by your healthcare provider.   °· If you are a smoker, the most important thing that you can do is stop smoking. Continuing to smoke will cause further lung damage and breathing trouble. Ask your health care provider for help with quitting smoking. He or she can direct you to community resources or hospitals that provide support. °· Avoid exposure to irritants such as smoke, chemicals, and fumes that aggravate your breathing. °· Use oxygen therapy and pulmonary rehabilitation if directed by your health care provider. If you require home oxygen therapy, ask your healthcare provider whether you should purchase a pulse oximeter to measure your oxygen level at home.   °· Avoid contact with individuals who have a contagious illness. °· Avoid extreme temperature and humidity changes. °· Eat healthy foods. Eating smaller, more frequent meals and resting before  meals may help you maintain your strength. °· Stay active, but balance activity with periods of rest. Exercise and physical activity will help you maintain your ability to do things you want to do. °· Preventing infection and hospitalization is very important when you have COPD. Make sure to receive all the vaccines your health care provider recommends, especially the pneumococcal and influenza vaccines. Ask your healthcare provider whether you need a pneumonia vaccine. °· Learn and use relaxation techniques to manage stress. °· Learn and use controlled breathing techniques as directed by your health care provider. Controlled breathing techniques include:   °· Pursed lip breathing. Start by breathing in (inhaling) through your nose for 1 second. Then, purse your lips as if you were going to whistle and breathe out (exhale) through the pursed lips for 2 seconds.   °· Diaphragmatic breathing. Start by putting one hand on your abdomen just above your waist. Inhale slowly through your nose. The hand on your abdomen should   move out. Then purse your lips and exhale slowly. You should be able to feel the hand on your abdomen moving in as you exhale.   °· Learn and use controlled coughing to clear mucus from your lungs. Controlled coughing is a series of short, progressive coughs. The steps of controlled coughing are:   °1. Lean your head slightly forward.   °2. Breathe in deeply using diaphragmatic breathing.   °3. Try to hold your breath for 3 seconds.   °4. Keep your mouth slightly open while coughing twice.   °5. Spit any mucus out into a tissue.   °6. Rest and repeat the steps once or twice as needed. °SEEK MEDICAL CARE IF:  °· You are coughing up more mucus than usual.   °· There is a change in the color or thickness of your mucus.   °· Your breathing is more labored than usual.   °· Your breathing is faster than usual.   °SEEK IMMEDIATE MEDICAL CARE IF:  °· You have shortness of breath while you are resting.   °· You  have shortness of breath that prevents you from: °· Being able to talk.   °· Performing your usual physical activities.   °· You have chest pain lasting longer than 5 minutes.   °· Your skin color is more cyanotic than usual. °· You measure low oxygen saturations for longer than 5 minutes with a pulse oximeter. °MAKE SURE YOU:  °· Understand these instructions. °· Will watch your condition. °· Will get help right away if you are not doing well or get worse. °Document Released: 01/30/2005 Document Revised: 02/10/2013 Document Reviewed: 12/17/2012 °ExitCare® Patient Information ©2014 [employer]. ° °

## 2013-06-11 ENCOUNTER — Ambulatory Visit (INDEPENDENT_AMBULATORY_CARE_PROVIDER_SITE_OTHER): Payer: BC Managed Care – PPO | Admitting: Family Medicine

## 2013-06-11 ENCOUNTER — Encounter: Payer: Self-pay | Admitting: Family Medicine

## 2013-06-11 VITALS — BP 122/81 | HR 92 | Temp 99.0°F | Ht 62.0 in | Wt 242.6 lb

## 2013-06-11 DIAGNOSIS — Z87891 Personal history of nicotine dependence: Secondary | ICD-10-CM | POA: Insufficient documentation

## 2013-06-11 DIAGNOSIS — F17200 Nicotine dependence, unspecified, uncomplicated: Secondary | ICD-10-CM

## 2013-06-11 DIAGNOSIS — E538 Deficiency of other specified B group vitamins: Secondary | ICD-10-CM

## 2013-06-11 DIAGNOSIS — E785 Hyperlipidemia, unspecified: Secondary | ICD-10-CM

## 2013-06-11 DIAGNOSIS — I10 Essential (primary) hypertension: Secondary | ICD-10-CM

## 2013-06-11 DIAGNOSIS — J449 Chronic obstructive pulmonary disease, unspecified: Secondary | ICD-10-CM

## 2013-06-11 DIAGNOSIS — F411 Generalized anxiety disorder: Secondary | ICD-10-CM

## 2013-06-11 DIAGNOSIS — Z720 Tobacco use: Secondary | ICD-10-CM

## 2013-06-11 DIAGNOSIS — Z1211 Encounter for screening for malignant neoplasm of colon: Secondary | ICD-10-CM

## 2013-06-11 NOTE — Progress Notes (Signed)
Patient ID: Hayley Crawford, female   DOB: Dec 12, 1965, 48 y.o.   MRN: [id number] SUBJECTIVE: CC: Chief Complaint  Patient presents with  . Follow-up    2 week follow up bronchitis states doing better. wants referral for colonscopy  in [place]    HPI:  Doing better. Still smoking some.  Past Medical History  Diagnosis Date  . Hypertension   . COPD (chronic obstructive pulmonary disease)    Past Surgical History  Procedure Laterality Date  . Tumor removed    . Bladder stretching     History   Social History  . Marital Status: Divorced    Spouse Name: N/A    Number of Children: N/A  . Years of Education: N/A   Occupational History  . Not on file.   Social History Main Topics  . Smoking status: Current Every Day Smoker  . Smokeless tobacco: Not on file  . Alcohol Use: Yes  . Drug Use: No  . Sexual Activity: Not on file   Other Topics Concern  . Not on file   Social History Narrative  . No narrative on file   Family History  Problem Relation Age of Onset  . Diabetes Other   . Cancer Mother     lung  . Brain cancer Mother     tumor   Current Outpatient Prescriptions on File Prior to Visit  Medication Sig Dispense Refill  . albuterol (PROVENTIL HFA;VENTOLIN HFA) 108 (90 BASE) MCG/ACT inhaler Inhale 2 puffs into the lungs every 6 (six) hours as needed for wheezing.  1 Inhaler  1  . busPIRone (BUSPAR) 10 MG tablet Take 10 mg by mouth 3 (three) times daily.      . cyanocobalamin (,VITAMIN B-12,) 1000 MCG/ML injection Inject 1,000 mcg into the muscle every 30 (thirty) days.        . FeFum-FePo-FA-B Cmp-C-Zn-Mn-Cu (PUREVIT DUALFE PLUS PO) Take 1 tablet by mouth daily. States she takes it everyday until they make her stomach upset. Then doesn't take it for a couple of days.      Marland Kitchen guaiFENesin-codeine (ROBITUSSIN AC) 100-10 MG/5ML syrup Take 5 mLs by mouth 3 (three) times daily as needed for cough.  120 mL  0  . ibuprofen (ADVIL,MOTRIN) 200 MG tablet Take 800 mg by  mouth every 6 (six) hours as needed. For pain       . meloxicam (MOBIC) 15 MG tablet Take 15 mg by mouth daily.      Marland Kitchen olmesartan-hydrochlorothiazide (BENICAR HCT) 40-25 MG per tablet Take 1 tablet by mouth daily.  30 tablet  4  . omeprazole (PRILOSEC) 20 MG capsule TAKE 1 CAPSULE (20 MG TOTAL)  BY MOUTH AS NEEDED. FOR HEARTBURN  30 capsule  3  . amoxicillin (AMOXIL) 875 MG tablet Take 1 tablet (875 mg total) by mouth 2 (two) times daily.  20 tablet  0  . Fluticasone-Salmeterol (ADVAIR) 250-50 MCG/DOSE AEPB Inhale 1 puff into the lungs 2 (two) times daily.  60 each  1  . oseltamivir (TAMIFLU) 75 MG capsule Take 1 capsule (75 mg total) by mouth 2 (two) times daily.  10 capsule  0   No current facility-administered medications on file prior to visit.   Allergies  Allergen Reactions  . Sulfa Antibiotics Swelling    "think it caused my throat to swell"   Immunization History  Administered Date(s) Administered  . Influenza,inj,Quad PF,36+ Mos 03/01/2013  . Tdap 05/06/2009   Prior to Admission medications   Medication Sig  Start Date End Date Taking? Authorizing Provider  albuterol (PROVENTIL HFA;VENTOLIN HFA) 108 (90 BASE) MCG/ACT inhaler Inhale 2 puffs into the lungs every 6 (six) hours as needed for wheezing. 08/27/12   Mary-Margaret Hassell Done, FNP  amoxicillin (AMOXIL) 875 MG tablet Take 1 tablet (875 mg total) by mouth 2 (two) times daily. 05/24/13   Vernie Shanks, MD  busPIRone (BUSPAR) 10 MG tablet Take 10 mg by mouth 3 (three) times daily.    Historical Provider, MD  cyanocobalamin (,VITAMIN B-12,) 1000 MCG/ML injection Inject 1,000 mcg into the muscle every 30 (thirty) days.      Historical Provider, MD  FeFum-FePo-FA-B Cmp-C-Zn-Mn-Cu (PUREVIT DUALFE PLUS PO) Take 1 tablet by mouth daily. States she takes it everyday until they make her stomach upset. Then doesn't take it for a couple of days.    Historical Provider, MD  Fluticasone-Salmeterol (ADVAIR) 250-50 MCG/DOSE AEPB Inhale 1 puff into  the lungs 2 (two) times daily. 05/24/13   Vernie Shanks, MD  guaiFENesin-codeine [facility]) 100-10 MG/5ML syrup Take 5 mLs by mouth 3 (three) times daily as needed for cough. 05/24/13   Vernie Shanks, MD  ibuprofen (ADVIL,MOTRIN) 200 MG tablet Take 800 mg by mouth every 6 (six) hours as needed. For pain     Historical Provider, MD  meloxicam (MOBIC) 15 MG tablet Take 15 mg by mouth daily.    Historical Provider, MD  olmesartan-hydrochlorothiazide (BENICAR HCT) 40-25 MG per tablet Take 1 tablet by mouth daily. 02/09/13   Chipper Herb, MD  omeprazole (PRILOSEC) 20 MG capsule TAKE 1 CAPSULE (20 MG TOTAL)  BY MOUTH AS NEEDED. FOR HEARTBURN 03/08/13   Mary-Margaret Hassell Done, FNP  oseltamivir (TAMIFLU) 75 MG capsule Take 1 capsule (75 mg total) by mouth 2 (two) times daily. 05/24/13   Vernie Shanks, MD     ROS: As above in the HPI. All other systems are stable or negative.  OBJECTIVE: APPEARANCE:  Patient in no acute distress.The patient appeared well nourished and normally developed. Acyanotic. Waist: VITAL SIGNS:BP 122/81  Pulse 92  Temp(Src) 99 F (37.2 C) (Oral)  Ht 5\' 2"  (1.575 m)  Wt 242 lb 9.6 oz (110.043 kg)  BMI 44.36 kg/m2  SpO2 100%  LMP 06/30/2012  WF Obese SKIN: warm and  Dry without overt rashes, tattoos and scars  HEAD and Neck: without JVD, Head and scalp: normal Eyes:No scleral icterus. Fundi normal, eye movements normal. Ears: Auricle normal, canal normal, Tympanic membranes normal, insufflation normal. Nose: normal Throat: normal Neck & thyroid: normal  CHEST & LUNGS: Chest wall: normal Lungs: Coarse bs.  CVS: Reveals the PMI to be normally located. Regular rhythm, First and Second Heart sounds are normal,  absence of murmurs, rubs or gallops. Peripheral vasculature: Radial pulses: normal Dorsal pedis pulses: normal Posterior pulses: normal  ABDOMEN:  Appearance: normal Benign, no organomegaly, no masses, no Abdominal Aortic enlargement. No  Guarding , no rebound. No Bruits. Bowel sounds: normal  RECTAL: N/A GU: N/A  EXTREMETIES: nonedematous.  MUSCULOSKELETAL:  Spine: normal Joints: intact  NEUROLOGIC: oriented to time,place and person; nonfocal. Strength is normal Sensory is normal Reflexes are normal Cranial Nerves are normal.  ASSESSMENT: COPD (chronic obstructive pulmonary disease)  Other and unspecified hyperlipidemia  Vitamin B 12 deficiency  GAD (generalized anxiety disorder)  Tobacco user  Hypertension  Screen for colon cancer - Plan: Ambulatory referral to Gastroenterology  PLAN: Smoking cessation, counselled and handout in the AVS. Continue medications. Reviewed her medications.  Orders Placed This  Encounter  Procedures  . Ambulatory referral to Gastroenterology    Referral Priority:  Routine    Referral Type:  Consultation    Referral Reason:  Specialty Services Required    Requested Specialty:  Gastroenterology    Number of Visits Requested:  1   No orders of the defined types were placed in this encounter.   There are no discontinued medications. Return in about 4 weeks (around 07/09/2013) for Recheck medical problems.  Sylina Henion P. Jacelyn Grip, M.D.

## 2013-06-11 NOTE — Patient Instructions (Signed)
Smoking Cessation Quitting smoking is important to your health and has many advantages. However, it is not always easy to quit since nicotine is a very addictive drug. Often times, people try 3 times or more before being able to quit. This document explains the best ways for you to prepare to quit smoking. Quitting takes hard work and a lot of effort, but you can do it. ADVANTAGES OF QUITTING SMOKING  You will live longer, feel better, and live better.  Your body will feel the impact of quitting smoking almost immediately.  Within 20 minutes, blood pressure decreases. Your pulse returns to its normal level.  After 8 hours, carbon monoxide levels in the blood return to normal. Your oxygen level increases.  After 24 hours, the chance of having a heart attack starts to decrease. Your breath, hair, and body stop smelling like smoke.  After 48 hours, damaged nerve endings begin to recover. Your sense of taste and smell improve.  After 72 hours, the body is virtually free of nicotine. Your bronchial tubes relax and breathing becomes easier.  After 2 to 12 weeks, lungs can hold more air. Exercise becomes easier and circulation improves.  The risk of having a heart attack, stroke, cancer, or lung disease is greatly reduced.  After 1 year, the risk of coronary heart disease is cut in half.  After 5 years, the risk of stroke falls to the same as a nonsmoker.  After 10 years, the risk of lung cancer is cut in half and the risk of other cancers decreases significantly.  After 15 years, the risk of coronary heart disease drops, usually to the level of a nonsmoker.  If you are pregnant, quitting smoking will improve your chances of having a healthy baby.  The people you live with, especially any children, will be healthier.  You will have extra money to spend on things other than cigarettes. QUESTIONS TO THINK ABOUT BEFORE ATTEMPTING TO QUIT You may want to talk about your answers with your  caregiver.  Why do you want to quit?  If you tried to quit in the past, what helped and what did not?  What will be the most difficult situations for you after you quit? How will you plan to handle them?  Who can help you through the tough times? Your family? Friends? A caregiver?  What pleasures do you get from smoking? What ways can you still get pleasure if you quit? Here are some questions to ask your caregiver:  How can you help me to be successful at quitting?  What medicine do you think would be best for me and how should I take it?  What should I do if I need more help?  What is smoking withdrawal like? How can I get information on withdrawal? GET READY  Set a quit date.  Change your environment by getting rid of all cigarettes, ashtrays, matches, and lighters in your home, car, or work. Do not let people smoke in your home.  Review your past attempts to quit. Think about what worked and what did not. GET SUPPORT AND ENCOURAGEMENT You have a better chance of being successful if you have help. You can get support in many ways.  Tell your family, friends, and co-workers that you are going to quit and need their support. Ask them not to smoke around you.  Get individual, group, or telephone counseling and support. Programs are available at local hospitals and health centers. Call your local health department for   information about programs in your area.  Spiritual beliefs and practices may help some smokers quit.  Download a "quit meter" on your computer to keep track of quit statistics, such as how long you have gone without smoking, cigarettes not smoked, and money saved.  Get a self-help book about quitting smoking and staying off of tobacco. LEARN NEW SKILLS AND BEHAVIORS  Distract yourself from urges to smoke. Talk to someone, go for a walk, or occupy your time with a task.  Change your normal routine. Take a different route to work. Drink tea instead of coffee.  Eat breakfast in a different place.  Reduce your stress. Take a hot bath, exercise, or read a book.  Plan something enjoyable to do every day. Reward yourself for not smoking.  Explore interactive web-based programs that specialize in helping you quit. GET MEDICINE AND USE IT CORRECTLY Medicines can help you stop smoking and decrease the urge to smoke. Combining medicine with the above behavioral methods and support can greatly increase your chances of successfully quitting smoking.  Nicotine replacement therapy helps deliver nicotine to your body without the negative effects and risks of smoking. Nicotine replacement therapy includes nicotine gum, lozenges, inhalers, nasal sprays, and skin patches. Some may be available over-the-counter and others require a prescription.  Antidepressant medicine helps people abstain from smoking, but how this works is unknown. This medicine is available by prescription.  Nicotinic receptor partial agonist medicine simulates the effect of nicotine in your brain. This medicine is available by prescription. Ask your caregiver for advice about which medicines to use and how to use them based on your health history. Your caregiver will tell you what side effects to look out for if you choose to be on a medicine or therapy. Carefully read the information on the package. Do not use any other product containing nicotine while using a nicotine replacement product.  RELAPSE OR DIFFICULT SITUATIONS Most relapses occur within the first 3 months after quitting. Do not be discouraged if you start smoking again. Remember, most people try several times before finally quitting. You may have symptoms of withdrawal because your body is used to nicotine. You may crave cigarettes, be irritable, feel very hungry, cough often, get headaches, or have difficulty concentrating. The withdrawal symptoms are only temporary. They are strongest when you first quit, but they will go away within  10 14 days. To reduce the chances of relapse, try to:  Avoid drinking alcohol. Drinking lowers your chances of successfully quitting.  Reduce the amount of caffeine you consume. Once you quit smoking, the amount of caffeine in your body increases and can give you symptoms, such as a rapid heartbeat, sweating, and anxiety.  Avoid smokers because they can make you want to smoke.  Do not let weight gain distract you. Many smokers will gain weight when they quit, usually less than 10 pounds. Eat a healthy diet and stay active. You can always lose the weight gained after you quit.  Find ways to improve your mood other than smoking. FOR MORE INFORMATION  www.smokefree.gov  Document Released: 04/16/2001 Document Revised: 10/22/2011 Document Reviewed: 08/01/2011 [employer] Patient Information 2014 [employer].  

## 2013-06-14 ENCOUNTER — Encounter: Payer: Self-pay | Admitting: *Deleted

## 2013-07-05 ENCOUNTER — Ambulatory Visit: Payer: BC Managed Care – PPO | Admitting: Family Medicine

## 2013-07-08 ENCOUNTER — Encounter: Payer: Self-pay | Admitting: Family Medicine

## 2013-07-08 ENCOUNTER — Ambulatory Visit (INDEPENDENT_AMBULATORY_CARE_PROVIDER_SITE_OTHER): Payer: BC Managed Care – PPO | Admitting: Family Medicine

## 2013-07-08 ENCOUNTER — Ambulatory Visit (INDEPENDENT_AMBULATORY_CARE_PROVIDER_SITE_OTHER): Payer: BC Managed Care – PPO

## 2013-07-08 VITALS — BP 127/96 | HR 89 | Temp 99.4°F | Ht 62.0 in | Wt 237.6 lb

## 2013-07-08 DIAGNOSIS — J209 Acute bronchitis, unspecified: Secondary | ICD-10-CM

## 2013-07-08 DIAGNOSIS — R0602 Shortness of breath: Secondary | ICD-10-CM

## 2013-07-08 MED ORDER — HYDROCODONE-HOMATROPINE 5-1.5 MG/5ML PO SYRP: 5.0000 mL | ORAL_SOLUTION | Freq: Three times a day (TID) | ORAL | Status: DC | PRN

## 2013-07-08 MED ORDER — PREDNISONE 10 MG PO TABS: ORAL_TABLET | ORAL | Status: DC

## 2013-07-08 MED ORDER — LEVALBUTEROL HCL 1.25 MG/0.5ML IN NEBU: 1.2500 mg | INHALATION_SOLUTION | Freq: Once | RESPIRATORY_TRACT | Status: DC

## 2013-07-08 NOTE — Progress Notes (Signed)
   Subjective:    Patient ID: Hayley Crawford, female    DOB: 05/16/1965, 48 y.o.   MRN: [id number]  HPI This 48 y.o. female presents for evaluation of cough, shortness of breath, and URI sx's. She has hx of tobacco abuse since she was 48 years old.  She is getting progressively SOB And has DOE.  He oxygen saturation when she arrived on room air was 95% and when she walked Back to the room she had dyspnea and her oxygen saturation was 92%.  She is using albuterol Short acting bronchodilator a couple times a day.   Review of Systems C/o Shortness of breath and cough   No chest pain,  HA, dizziness, vision change, N/V, diarrhea, constipation, dysuria, urinary urgency or frequency, myalgias, arthralgias or rash.  Objective:   Physical Exam Vital signs noted  Well developed well nourished obese female.  HEENT - Head atraumatic Normocephalic                Eyes - PERRLA, Conjuctiva - clear Sclera- Clear EOMI                Ears - EAC's Wnl TM's Wnl Gross Hearing WNL                Nose - Nares patent                 Throat - oropharanx wnl Respiratory - Lungs distant with expiratory wheezes scattered Cardiac - RRR S1 and S2 without murmur   Post neb tx - Lungs with scattered wheezes bilateral  CXR - No infiltrates Prelimnary reading by Gwyndolyn Saxon Melecio Cueto,FNP    Assessment & Plan:  Acute bronchitis - Plan: predniSONE (DELTASONE) 10 MG tablet, HYDROcodone-homatropine (HYCODAN) 5-1.5 MG/5ML syrup, Ambulatory referral to Pulmonology, DG Chest 2 View  Shortness of breath - Plan: predniSONE (DELTASONE) 10 MG tablet, HYDROcodone-homatropine (HYCODAN) 5-1.5 MG/5ML syrup, Ambulatory referral to Pulmonology, DG Chest 2 View  COPD - Refer to [place] FNP

## 2013-07-09 ENCOUNTER — Ambulatory Visit: Payer: BC Managed Care – PPO | Admitting: Family Medicine

## 2013-07-12 ENCOUNTER — Telehealth: Payer: Self-pay | Admitting: *Deleted

## 2013-07-12 ENCOUNTER — Encounter: Payer: Self-pay | Admitting: *Deleted

## 2013-07-12 NOTE — Telephone Encounter (Signed)
Pt came to office wanting work note for today and tom. Pt advised to be seen but doesn't want to be seen. Wants to wait for pulm appt. Work note ok for today and tom per [employer].

## 2013-07-15 ENCOUNTER — Other Ambulatory Visit: Payer: Self-pay | Admitting: Nurse Practitioner

## 2013-07-15 ENCOUNTER — Other Ambulatory Visit: Payer: Self-pay | Admitting: Family Medicine

## 2013-07-19 ENCOUNTER — Ambulatory Visit (INDEPENDENT_AMBULATORY_CARE_PROVIDER_SITE_OTHER): Payer: BC Managed Care – PPO | Admitting: Family Medicine

## 2013-07-19 ENCOUNTER — Encounter: Payer: Self-pay | Admitting: Family Medicine

## 2013-07-19 VITALS — BP 128/77 | HR 107 | Temp 99.5°F | Ht 62.0 in | Wt 241.0 lb

## 2013-07-19 DIAGNOSIS — J449 Chronic obstructive pulmonary disease, unspecified: Secondary | ICD-10-CM

## 2013-07-19 DIAGNOSIS — F17200 Nicotine dependence, unspecified, uncomplicated: Secondary | ICD-10-CM

## 2013-07-19 DIAGNOSIS — Z0289 Encounter for other administrative examinations: Secondary | ICD-10-CM

## 2013-07-19 MED ORDER — NICOTINE 21 MG/24HR TD PT24: 21.0000 mg | MEDICATED_PATCH | Freq: Every day | TRANSDERMAL | Status: DC

## 2013-07-19 MED ORDER — NICOTINE 7 MG/24HR TD PT24: 7.0000 mg | MEDICATED_PATCH | Freq: Every day | TRANSDERMAL | Status: DC

## 2013-07-19 MED ORDER — NICOTINE 14 MG/24HR TD PT24: 14.0000 mg | MEDICATED_PATCH | Freq: Every day | TRANSDERMAL | Status: DC

## 2013-07-19 NOTE — Progress Notes (Signed)
   Subjective:    Patient ID: Hulda Humphrey, female    DOB: 01-21-1966, 48 y.o.   MRN: [id number]  HPI This 48 y.o. female presents for evaluation of Follow up on her recent copd exacerbation. She has recently seen her pulmonolgist who diagnosed her with copd. She is a long time smoker And she would like to quit.  She is having difficulty doing her job due to her breathing and not being Able to keep up and she states she was advised by her employer to take a couple weeks off.   Review of Systems C/o sob No chest pain, SOB, HA, dizziness, vision change, N/V, diarrhea, constipation, dysuria, urinary urgency or frequency, myalgias, arthralgias or rash.     Objective:   Physical Exam Vital signs noted  Well developed well nourished female.  HEENT - Head atraumatic Normocephalic                Eyes - PERRLA, Conjuctiva - clear Sclera- Clear EOMI                Ears - EAC's Wnl TM's Wnl Gross Hearing WN                Throat - oropharanx wnl Respiratory - Lungs CTA bilateral Cardiac - RRR S1 and S2 without murmur GI - Abdomen soft Nontender and bowel sounds active x 4 Extremities - No edema. Neuro - Grossly intact.       Assessment & Plan:  COPD (chronic obstructive pulmonary disease) - Plan: nicotine (EQL NICOTINE) 21 mg/24hr patch, nicotine (EQL NICOTINE) 14 mg/24hr patch, nicotine (EQ NICOTINE) 7 mg/24hr patch  Tobacco use disorder - Plan: nicotine (EQL NICOTINE) 21 mg/24hr patch, nicotine (EQL NICOTINE) 14 mg/24hr patch, nicotine (EQ NICOTINE) 7 mg/24hr patch  Lysbeth Penner FNP

## 2013-08-03 DIAGNOSIS — Z0289 Encounter for other administrative examinations: Secondary | ICD-10-CM

## 2013-08-20 ENCOUNTER — Encounter: Payer: Self-pay | Admitting: Physician Assistant

## 2013-08-20 ENCOUNTER — Ambulatory Visit (INDEPENDENT_AMBULATORY_CARE_PROVIDER_SITE_OTHER): Payer: BC Managed Care – PPO | Admitting: Physician Assistant

## 2013-08-20 VITALS — BP 136/78 | HR 108 | Temp 99.1°F | Ht 62.0 in | Wt 239.0 lb

## 2013-08-20 DIAGNOSIS — J019 Acute sinusitis, unspecified: Secondary | ICD-10-CM

## 2013-08-20 MED ORDER — FLUTICASONE PROPIONATE 50 MCG/ACT NA SUSP: 2.0000 | Freq: Every day | NASAL | Status: DC

## 2013-08-20 MED ORDER — AZITHROMYCIN 250 MG PO TABS: ORAL_TABLET | ORAL | Status: DC

## 2013-08-20 NOTE — Progress Notes (Signed)
Subjective: 48 y/o female with comorbid COPD presents with sinus pain/pressure and cough since working out in the yard last week. She is concerned that she will not be able to make her appt with her Pulmonologist next week because she is not allowed to go to her appt if she is sick. She has reduced her smoking since symptoms began.      Patient ID: Hayley Crawford, female   DOB: [birth year], 48 y.o.   MRN: [id number]  Otalgia  Associated symptoms include coughing and a sore throat. Pertinent negatives include no ear discharge or hearing loss.  Sore Throat  Associated symptoms include congestion (nasal), coughing and ear pain. Pertinent negatives include no ear discharge, shortness of breath or stridor.     Review of Systems  Constitutional: Positive for activity change (increased sleepiing in the past few days) and fatigue. Negative for fever, chills, diaphoresis, appetite change and unexpected weight change.  HENT: Positive for congestion (nasal), ear pain, postnasal drip, sinus pressure and sore throat. Negative for ear discharge, hearing loss, mouth sores, nosebleeds, sneezing and tinnitus.   Eyes: Negative.   Respiratory: Positive for cough, chest tightness and wheezing. Negative for shortness of breath and stridor.   Cardiovascular: Negative for chest pain, palpitations and leg swelling.  Gastrointestinal: Negative.   Neurological: Negative.        Objective:   Physical Exam  Nursing note and vitals reviewed. Constitutional: She is oriented to person, place, and time. She appears well-developed and well-nourished. No distress.  HENT:  Head: Normocephalic and atraumatic.  Right Ear: External ear normal.  Left Ear: External ear normal.  Nose: Nose normal.  Mouth/Throat: Oropharynx is clear and moist. No oropharyngeal exudate.  Eyes: Right eye exhibits no discharge. Left eye exhibits no discharge.  Neck: Thyromegaly present.  Cardiovascular: Normal rate, regular rhythm and normal heart  sounds.  Exam reveals no gallop and no friction rub.   No murmur heard. Pulmonary/Chest: Effort normal. No respiratory distress. She has no wheezes. She exhibits no tenderness.  Abdominal: Soft. Bowel sounds are normal.  Neurological: She is alert and oriented to person, place, and time. She has normal reflexes.  Skin: She is not diaphoretic.  Psychiatric: She has a normal mood and affect. Her behavior is normal. Judgment and thought content normal.       Assessment:    1. Acute Sinusitis: Plan:     Rx Azithromycin 250mg  , 2 pills on day 1, 1 pill on days 2-5. Flonase nasal spray, 2 sprays each nostril q day. Avoid allergans. Smoking cessation. RTC if s/s worsen or dni

## 2013-08-20 NOTE — Patient Instructions (Signed)

## 2013-12-08 ENCOUNTER — Ambulatory Visit (INDEPENDENT_AMBULATORY_CARE_PROVIDER_SITE_OTHER): Payer: BC Managed Care – PPO | Admitting: Family Medicine

## 2013-12-08 ENCOUNTER — Encounter: Payer: Self-pay | Admitting: Family Medicine

## 2013-12-08 VITALS — BP 125/78 | HR 85 | Temp 96.9°F | Ht 62.0 in | Wt 244.8 lb

## 2013-12-08 DIAGNOSIS — S39011A Strain of muscle, fascia and tendon of abdomen, initial encounter: Secondary | ICD-10-CM

## 2013-12-08 DIAGNOSIS — IMO0002 Reserved for concepts with insufficient information to code with codable children: Secondary | ICD-10-CM

## 2013-12-08 MED ORDER — CYCLOBENZAPRINE HCL 10 MG PO TABS: 10.0000 mg | ORAL_TABLET | Freq: Three times a day (TID) | ORAL | Status: DC | PRN

## 2013-12-08 NOTE — Progress Notes (Signed)
   Subjective:    Patient ID: Hayley Crawford, female    DOB: March 20, 1966, 48 y.o.   MRN: [id number]  HPI  This 48 y.o. female presents for evaluation of abdominal muscle strain.  Review of Systems    No chest pain, SOB, HA, dizziness, vision change, N/V, diarrhea, constipation, dysuria, urinary urgency or frequency, myalgias, arthralgias or rash.  Objective:   Physical Exam  Vital signs noted  Well developed well nourished female.  HEENT - Head atraumatic Normocephalic Respiratory - Lungs CTA bilateral Cardiac - RRR S1 and S2 without murmur GI - Abdomen soft Nontender and bowel sounds active x 4, no ventral hernia MS - TTP left lower quadrant of abdomen.      Assessment & Plan:  Abdominal muscle strain, initial encounter - Plan: cyclobenzaprine (FLEXERIL) 10 MG tablet Lysbeth Penner FNP

## 2013-12-16 ENCOUNTER — Telehealth: Payer: Self-pay | Admitting: Family Medicine

## 2013-12-16 MED ORDER — OLMESARTAN MEDOXOMIL-HCTZ 40-25 MG PO TABS: 1.0000 | ORAL_TABLET | Freq: Every day | ORAL | Status: DC

## 2013-12-16 NOTE — Telephone Encounter (Signed)
Sent electronically 

## 2013-12-17 ENCOUNTER — Other Ambulatory Visit: Payer: Self-pay

## 2013-12-17 MED ORDER — OLMESARTAN MEDOXOMIL-HCTZ 40-25 MG PO TABS: 1.0000 | ORAL_TABLET | Freq: Every day | ORAL | Status: DC

## 2014-03-22 ENCOUNTER — Other Ambulatory Visit: Payer: Self-pay | Admitting: Family Medicine

## 2014-03-22 DIAGNOSIS — Z1231 Encounter for screening mammogram for malignant neoplasm of breast: Secondary | ICD-10-CM

## 2014-04-07 ENCOUNTER — Other Ambulatory Visit: Payer: Self-pay | Admitting: Family Medicine

## 2014-04-07 ENCOUNTER — Telehealth: Payer: Self-pay | Admitting: Family Medicine

## 2014-04-07 MED ORDER — FLUTICASONE FUROATE-VILANTEROL 100-25 MCG/INH IN AEPB: 1.0000 | INHALATION_SPRAY | RESPIRATORY_TRACT | Status: DC

## 2014-04-07 NOTE — Telephone Encounter (Signed)
Patient advised to got to urgent care for them to check for a tampon. Patient is also requesting a refill on Breo. We have not filled here before is it ok to send in rx

## 2014-04-07 NOTE — Telephone Encounter (Signed)
Pt aware.

## 2014-04-07 NOTE — Telephone Encounter (Signed)
breo sent to pharmacy

## 2014-04-13 ENCOUNTER — Ambulatory Visit (HOSPITAL_COMMUNITY)
Admission: RE | Admit: 2014-04-13 | Discharge: 2014-04-13 | Disposition: A | Discharge status: Home / Self Care | Payer: BC Managed Care – PPO | Source: Ambulatory Visit | Attending: Family Medicine | Admitting: Family Medicine

## 2014-04-13 DIAGNOSIS — Z1231 Encounter for screening mammogram for malignant neoplasm of breast: Secondary | ICD-10-CM | POA: Insufficient documentation

## 2014-04-23 ENCOUNTER — Other Ambulatory Visit: Payer: Self-pay | Admitting: Family Medicine

## 2014-05-29 ENCOUNTER — Other Ambulatory Visit: Payer: Self-pay | Admitting: Family Medicine

## 2014-06-06 ENCOUNTER — Ambulatory Visit (INDEPENDENT_AMBULATORY_CARE_PROVIDER_SITE_OTHER): Payer: BLUE CROSS/BLUE SHIELD | Admitting: Family

## 2014-06-06 ENCOUNTER — Encounter: Payer: Self-pay | Admitting: Family

## 2014-06-06 VITALS — BP 121/80 | HR 89 | Temp 97.0°F | Ht 62.0 in | Wt 239.8 lb

## 2014-06-06 DIAGNOSIS — F411 Generalized anxiety disorder: Secondary | ICD-10-CM

## 2014-06-06 DIAGNOSIS — E559 Vitamin D deficiency, unspecified: Secondary | ICD-10-CM

## 2014-06-06 DIAGNOSIS — I10 Essential (primary) hypertension: Secondary | ICD-10-CM

## 2014-06-06 DIAGNOSIS — E538 Deficiency of other specified B group vitamins: Secondary | ICD-10-CM

## 2014-06-06 DIAGNOSIS — R5383 Other fatigue: Secondary | ICD-10-CM

## 2014-06-06 DIAGNOSIS — Z0000 Encounter for general adult medical examination without abnormal findings: Secondary | ICD-10-CM

## 2014-06-06 DIAGNOSIS — K219 Gastro-esophageal reflux disease without esophagitis: Secondary | ICD-10-CM

## 2014-06-06 DIAGNOSIS — Z720 Tobacco use: Secondary | ICD-10-CM

## 2014-06-06 DIAGNOSIS — J449 Chronic obstructive pulmonary disease, unspecified: Secondary | ICD-10-CM

## 2014-06-06 DIAGNOSIS — E785 Hyperlipidemia, unspecified: Secondary | ICD-10-CM

## 2014-06-06 MED ORDER — MELOXICAM 15 MG PO TABS: 15.0000 mg | ORAL_TABLET | Freq: Every day | ORAL | Status: DC

## 2014-06-06 MED ORDER — BUSPIRONE HCL 10 MG PO TABS: 10.0000 mg | ORAL_TABLET | Freq: Three times a day (TID) | ORAL | Status: DC

## 2014-06-06 MED ORDER — OMEPRAZOLE 20 MG PO CPDR: DELAYED_RELEASE_CAPSULE | ORAL | Status: DC

## 2014-06-06 MED ORDER — OLMESARTAN MEDOXOMIL-HCTZ 40-25 MG PO TABS: 1.0000 | ORAL_TABLET | Freq: Every day | ORAL | Status: DC

## 2014-06-06 MED ORDER — ALBUTEROL SULFATE (2.5 MG/3ML) 0.083% IN NEBU: 2.5000 mg | INHALATION_SOLUTION | Freq: Four times a day (QID) | RESPIRATORY_TRACT | Status: DC | PRN

## 2014-06-06 NOTE — Patient Instructions (Signed)

## 2014-06-06 NOTE — Progress Notes (Signed)
Subjective:    Patient ID: Hayley Crawford, female    DOB: [birth year], 49 y.o.   MRN: [id number]  Hypertension This is a chronic problem. The current episode started more than 1 year ago. The problem has been resolved since onset. The problem is controlled. Associated symptoms include anxiety and palpitations ("if i get really upset"). Pertinent negatives include no chest pain, headaches, peripheral edema or shortness of breath. Risk factors for coronary artery disease include dyslipidemia, obesity, post-menopausal state, family history and smoking/tobacco exposure. Past treatments include diuretics and angiotensin blockers. The current treatment provides significant improvement. There is no history of kidney disease, CAD/MI, CVA, heart failure or a thyroid problem. Identifiable causes of hypertension include sleep apnea.  Hyperlipidemia This is a chronic problem. The current episode started more than 1 year ago. The problem is uncontrolled. Recent lipid tests were reviewed and are high. Exacerbating diseases include obesity. She has no history of diabetes or hypothyroidism. Factors aggravating her hyperlipidemia include smoking. Pertinent negatives include no chest pain or shortness of breath. She is currently on no antihyperlipidemic treatment. The current treatment provides no improvement of lipids. Compliance problems include adherence to diet.  Risk factors for coronary artery disease include dyslipidemia, family history, hypertension and a sedentary lifestyle.  Gastrophageal Reflux She reports no chest pain, no coughing, no heartburn, no sore throat or no wheezing. This is a chronic problem. The current episode started more than 1 year ago. The problem occurs rarely. The symptoms are aggravated by certain foods. Pertinent negatives include no fatigue or muscle weakness. She has tried a PPI for the symptoms. The treatment provided significant relief.  Anxiety Presents for follow-up visit. Symptoms  include excessive worry, nervous/anxious behavior and palpitations ("if i get really upset"). Patient reports no chest pain, decreased concentration, depressed mood, insomnia, panic or shortness of breath. Symptoms occur occasionally.   Her past medical history is significant for anxiety/panic attacks. There is no history of depression.      Review of Systems  Constitutional: Negative.  Negative for fatigue.  HENT: Negative.  Negative for sore throat.   Eyes: Negative.   Respiratory: Negative.  Negative for cough, shortness of breath and wheezing.   Cardiovascular: Positive for palpitations ("if i get really upset"). Negative for chest pain.  Gastrointestinal: Negative.  Negative for heartburn.  Endocrine: Negative.   Genitourinary: Negative.   Musculoskeletal: Negative.  Negative for muscle weakness.  Neurological: Negative.  Negative for headaches.  Hematological: Negative.   Psychiatric/Behavioral: Negative for decreased concentration. The patient is nervous/anxious. The patient does not have insomnia.   All other systems reviewed and are negative.      Objective:   Physical Exam  Constitutional: She is oriented to person, place, and time. She appears well-developed and well-nourished. No distress.  HENT:  Head: Normocephalic and atraumatic.  Right Ear: External ear normal.  Left Ear: External ear normal.  Nose: Nose normal.  Mouth/Throat: Oropharynx is clear and moist.  Eyes: Pupils are equal, round, and reactive to light.  Neck: Normal range of motion. Neck supple. No thyromegaly present.  Cardiovascular: Normal rate, regular rhythm, normal heart sounds and intact distal pulses.   No murmur heard. Pulmonary/Chest: Effort normal and breath sounds normal. No respiratory distress. She has no wheezes.  Abdominal: Soft. Bowel sounds are normal. She exhibits no distension. There is no tenderness.  Musculoskeletal: Normal range of motion. She exhibits no edema or tenderness.    Neurological: She is alert and oriented to person, place, and  time. She has normal reflexes. No cranial nerve deficit.  Skin: Skin is warm and dry.  Psychiatric: She has a normal mood and affect. Her behavior is normal. Judgment and thought content normal.  Vitals reviewed.    BP 121/80 mmHg  Pulse 89  Temp(Src) 97 F (36.1 C) (Oral)  Ht '5\' 2"'  (1.575 m)  Wt 239 lb 12.8 oz (108.773 kg)  BMI 43.85 kg/m2      Assessment & Plan:  1. Essential hypertension - CMP14+EGFR - olmesartan-hydrochlorothiazide (BENICAR HCT) 40-25 MG per tablet; Take 1 tablet by mouth daily.  Dispense: 90 tablet; Refill: 4  2. Chronic obstructive pulmonary disease, unspecified COPD, unspecified chronic bronchitis type - CMP14+EGFR - DME Nebulizer machine - albuterol (PROVENTIL) (2.5 MG/3ML) 0.083% nebulizer solution; Take 3 mLs (2.5 mg total) by nebulization every 6 (six) hours as needed for wheezing or shortness of breath.  Dispense: 150 mL; Refill: 11  3. Gastroesophageal reflux disease, esophagitis presence not specified - CMP14+EGFR - omeprazole (PRILOSEC) 20 MG capsule; TAKE 1 CAPSULE (20 MG TOTAL)   BY MOUTH AS NEEDED. FOR HEARTBURN  Dispense: 90 capsule; Refill: 4  4. GAD (generalized anxiety disorder) - CMP14+EGFR - busPIRone (BUSPAR) 10 MG tablet; Take 1 tablet (10 mg total) by mouth 3 (three) times daily.  Dispense: 90 tablet; Refill: 1  5. Tobacco user - CMP14+EGFR  6. Hyperlipidemia - CMP14+EGFR - Lipid panel  7. Vitamin B 12 deficiency - CMP14+EGFR  8. Vitamin D deficiency - Vit D  25 hydroxy (rtn osteoporosis monitoring)  9. Other fatigue  - Vit D  25 hydroxy (rtn osteoporosis monitoring) - Anemia Profile B   Continue all meds Labs pending Health Maintenance reviewed Diet and exercise encouraged RTO 3 months  FMLA paperwork filled out on this visit  [name], FNP

## 2014-06-07 ENCOUNTER — Other Ambulatory Visit: Payer: Self-pay | Admitting: Family

## 2014-06-07 DIAGNOSIS — E559 Vitamin D deficiency, unspecified: Secondary | ICD-10-CM

## 2014-06-07 DIAGNOSIS — D509 Iron deficiency anemia, unspecified: Secondary | ICD-10-CM | POA: Insufficient documentation

## 2014-06-07 DIAGNOSIS — D649 Anemia, unspecified: Secondary | ICD-10-CM

## 2014-06-07 LAB — ANEMIA PROFILE B
BASOS ABS: 0.1 10*3/uL (ref 0.0–0.2)
Basos: 1 %
EOS: 3 %
Eosinophils Absolute: 0.3 10*3/uL (ref 0.0–0.4)
FERRITIN: 6 ng/mL — AB (ref 15–150)
FOLATE: 10.2 ng/mL (ref >3.0)
HCT: 30.2 % — ABNORMAL LOW (ref 34.0–46.6)
HEMOGLOBIN: 8.9 g/dL — AB (ref 11.1–15.9)
IRON SATURATION: 5 % — AB (ref 15–55)
Immature Grans (Abs): 0 10*3/uL (ref 0.0–0.1)
Immature Granulocytes: 0 %
Iron: 25 ug/dL — ABNORMAL LOW (ref 27–159)
LYMPHS ABS: 2.1 10*3/uL (ref 0.7–3.1)
Lymphs: 23 %
MCH: 22.6 pg — ABNORMAL LOW (ref 26.6–33.0)
MCHC: 29.5 g/dL — ABNORMAL LOW (ref 31.5–35.7)
MCV: 77 fL — ABNORMAL LOW (ref 79–97)
MONOS ABS: 0.6 10*3/uL (ref 0.1–0.9)
Monocytes: 6 %
Neutrophils Absolute: 6.1 10*3/uL (ref 1.4–7.0)
Neutrophils Relative %: 67 %
PLATELETS: 411 10*3/uL — AB (ref 150–379)
RBC: 3.93 x10E6/uL (ref 3.77–5.28)
RDW: 18.2 % — ABNORMAL HIGH (ref 12.3–15.4)
RETIC CT PCT: 2.1 % (ref 0.6–2.6)
TIBC: 470 ug/dL — AB (ref 250–450)
UIBC: 445 ug/dL — AB (ref 131–425)
Vitamin B-12: 243 pg/mL (ref 211–946)
WBC: 9.1 10*3/uL (ref 3.4–10.8)

## 2014-06-07 LAB — CMP14+EGFR
A/G RATIO: 1.3 (ref 1.1–2.5)
ALBUMIN: 3.9 g/dL (ref 3.5–5.5)
ALK PHOS: 89 IU/L (ref 39–117)
ALT: 24 IU/L (ref 0–32)
AST: 21 IU/L (ref 0–40)
BILIRUBIN TOTAL: 0.2 mg/dL (ref 0.0–1.2)
BUN/Creatinine Ratio: 14 (ref 9–23)
BUN: 10 mg/dL (ref 6–24)
CALCIUM: 9.2 mg/dL (ref 8.7–10.2)
CO2: 23 mmol/L (ref 18–29)
CREATININE: 0.71 mg/dL (ref 0.57–1.00)
Chloride: 103 mmol/L (ref 97–108)
GFR calc Af Amer: 115 mL/min/{1.73_m2} (ref >59)
GFR calc non Af Amer: 100 mL/min/{1.73_m2} (ref >59)
Globulin, Total: 2.9 g/dL (ref 1.5–4.5)
Glucose: 86 mg/dL (ref 65–99)
POTASSIUM: 4.5 mmol/L (ref 3.5–5.2)
SODIUM: 139 mmol/L (ref 134–144)
TOTAL PROTEIN: 6.8 g/dL (ref 6.0–8.5)

## 2014-06-07 LAB — LIPID PANEL
CHOL/HDL RATIO: 5.2 ratio — AB (ref 0.0–4.4)
Cholesterol, Total: 209 mg/dL — ABNORMAL HIGH (ref 100–199)
HDL: 40 mg/dL (ref >39)
LDL Calculated: 147 mg/dL — ABNORMAL HIGH (ref 0–99)
Triglycerides: 110 mg/dL (ref 0–149)
VLDL Cholesterol Cal: 22 mg/dL (ref 5–40)

## 2014-06-07 LAB — VITAMIN D 25 HYDROXY (VIT D DEFICIENCY, FRACTURES): Vit D, 25-Hydroxy: 8.9 ng/mL — ABNORMAL LOW (ref 30.0–100.0)

## 2014-06-07 MED ORDER — VITAMIN D (ERGOCALCIFEROL) 1.25 MG (50000 UNIT) PO CAPS: 50000.0000 [IU] | ORAL_CAPSULE | ORAL | Status: DC

## 2014-06-07 MED ORDER — PRAVASTATIN SODIUM 40 MG PO TABS: 40.0000 mg | ORAL_TABLET | Freq: Every day | ORAL | Status: DC

## 2014-06-17 ENCOUNTER — Other Ambulatory Visit: Payer: Self-pay | Admitting: Family Medicine

## 2014-06-29 ENCOUNTER — Ambulatory Visit (HOSPITAL_COMMUNITY): Payer: Self-pay | Admitting: Hematology & Oncology

## 2014-06-30 ENCOUNTER — Telehealth: Payer: Self-pay | Admitting: Family

## 2014-06-30 ENCOUNTER — Ambulatory Visit (HOSPITAL_COMMUNITY): Payer: Self-pay | Admitting: Hematology & Oncology

## 2014-07-01 ENCOUNTER — Ambulatory Visit (INDEPENDENT_AMBULATORY_CARE_PROVIDER_SITE_OTHER): Payer: BLUE CROSS/BLUE SHIELD | Admitting: Physician Assistant

## 2014-07-01 ENCOUNTER — Ambulatory Visit (HOSPITAL_COMMUNITY): Payer: Self-pay | Admitting: Hematology & Oncology

## 2014-07-01 ENCOUNTER — Other Ambulatory Visit: Payer: Self-pay | Admitting: Family Medicine

## 2014-07-01 VITALS — BP 145/91 | HR 92 | Temp 98.5°F | Ht 62.0 in | Wt 242.6 lb

## 2014-07-01 DIAGNOSIS — J441 Chronic obstructive pulmonary disease with (acute) exacerbation: Secondary | ICD-10-CM | POA: Diagnosis not present

## 2014-07-01 DIAGNOSIS — J0111 Acute recurrent frontal sinusitis: Secondary | ICD-10-CM

## 2014-07-01 MED ORDER — PREDNISONE (PAK) 10 MG PO TABS: ORAL_TABLET | ORAL | Status: DC

## 2014-07-01 MED ORDER — AMOXICILLIN 875 MG PO TABS: 875.0000 mg | ORAL_TABLET | Freq: Two times a day (BID) | ORAL | Status: DC

## 2014-07-01 MED ORDER — ALBUTEROL SULFATE HFA 108 (90 BASE) MCG/ACT IN AERS: 2.0000 | INHALATION_SPRAY | Freq: Four times a day (QID) | RESPIRATORY_TRACT | Status: DC | PRN

## 2014-07-01 NOTE — Progress Notes (Signed)
Subjective:     Patient ID: Hayley Crawford, female   DOB: 08-Jun-1965, 49 y.o.   MRN: [id number]  HPI 49 y/o fmale with comorbid COPD presents with c/o cough, fatigue, sinus pain, pressure, nasal congestion x 10 days. She has not been using her Albuterol nebulizer or Breo as prescribed because she states that "she cannot afford it" .    Review of Systems  Constitutional: Positive for fatigue. Negative for fever, chills and diaphoresis.  HENT: Positive for congestion, rhinorrhea, sinus pressure and sneezing. Negative for postnasal drip and sore throat.   Respiratory: Positive for cough and wheezing. Negative for apnea, choking, chest tightness, shortness of breath and stridor.   Cardiovascular: Negative.        Objective:   Physical Exam  Constitutional: She is oriented to person, place, and time. She appears well-developed and well-nourished. No distress.  Eyes: Right eye exhibits no discharge. Left eye exhibits no discharge.  Cardiovascular: Normal rate, regular rhythm and normal heart sounds.  Exam reveals no gallop and no friction rub.   No murmur heard. Pulmonary/Chest: Effort normal. No respiratory distress. She has wheezes (bilateral ). She has no rales. She exhibits no tenderness.  Neurological: She is alert and oriented to person, place, and time.  Skin: She is not diaphoretic.  Nursing note and vitals reviewed.      Assessment:    1. Acute Sinusitis: Amoxicillin 875mg  BID x 10 days, Flonase as directed 2. COPD Exacerbation: Nebulizer treatment given during visit, wheezing decreased. Encouraged patient to f/u with Pulmonologist to see if he can change her Rx to a medication that is more affordable.  3. Wheeze: Proventil inhaler on a regular basis q 4-6 hours for wheeze. Steripred dose pack as directed. Patient f/u in 2 weeks. If s/s worsen or if she has SOB should report to ER immediately.     Plan:

## 2014-07-15 ENCOUNTER — Encounter (HOSPITAL_COMMUNITY): Payer: Self-pay | Admitting: Hematology & Oncology

## 2014-07-15 ENCOUNTER — Encounter (HOSPITAL_COMMUNITY): Payer: BLUE CROSS/BLUE SHIELD | Attending: Hematology & Oncology | Admitting: Hematology & Oncology

## 2014-07-15 VITALS — BP 132/75 | HR 95 | Temp 98.4°F | Resp 18 | Ht 62.0 in | Wt 244.3 lb

## 2014-07-15 DIAGNOSIS — Z801 Family history of malignant neoplasm of trachea, bronchus and lung: Secondary | ICD-10-CM

## 2014-07-15 DIAGNOSIS — D649 Anemia, unspecified: Secondary | ICD-10-CM

## 2014-07-15 DIAGNOSIS — J449 Chronic obstructive pulmonary disease, unspecified: Secondary | ICD-10-CM | POA: Diagnosis not present

## 2014-07-15 DIAGNOSIS — Z720 Tobacco use: Secondary | ICD-10-CM

## 2014-07-15 DIAGNOSIS — F5089 Other specified eating disorder: Secondary | ICD-10-CM

## 2014-07-15 DIAGNOSIS — D500 Iron deficiency anemia secondary to blood loss (chronic): Secondary | ICD-10-CM

## 2014-07-15 DIAGNOSIS — R5383 Other fatigue: Secondary | ICD-10-CM

## 2014-07-15 DIAGNOSIS — E611 Iron deficiency: Secondary | ICD-10-CM

## 2014-07-15 NOTE — Patient Instructions (Addendum)
[place] at [facility]  Discharge Instructions:  You saw Dr Whitney Muse today.  You are going to get 3 doses of IV iron.  Return in 6 weeks to see Dr Whitney Muse and get lab work.  Please call the clinic if you have any questions or concerns. _______________________________________________________________  Thank you for choosing [place] at [facility] to provide your oncology and hematology care.  To afford each patient quality time with our providers, please arrive at least 15 minutes before your scheduled appointment.  You need to re-schedule your appointment if you arrive 10 or more minutes late.  We strive to give you quality time with our providers, and arriving late affects you and other patients whose appointments are after yours.  Also, if you no show three or more times for appointments you may be dismissed from the clinic.  Again, thank you for choosing [place] at [place] hope is that these requests will allow you access to exceptional care and in a timely manner. _______________________________________________________________  If you have questions after your visit, please contact our office at [phone] between the hours of 8:30 a.m. and 5:00 p.m. Voicemails left after 4:30 p.m. will not be returned until the following business day. _______________________________________________________________  For prescription refill requests, have your pharmacy contact our office. _______________________________________________________________  Recommendations made by the consultant and any test results will be sent to your referring physician. _______________________________________________________________

## 2014-07-15 NOTE — Progress Notes (Signed)
[place] CONSULT NOTE  Patient Care Team: Sharion Balloon, FNP as PCP - General (Nurse Practitioner)  CHIEF COMPLAINTS/PURPOSE OF CONSULTATION:  Iron deficiency Anemia  HISTORY OF PRESENTING ILLNESS:  Hayley Crawford 49 y.o. female is here because of iron deficiency. Laboratory studies performed in February of this year showed an elevated TIBC at 470 ug/dl, iron low at 25 ug/dl, and saturation at 5% and serum ferritin at 6 ng/ml. Hemoglobin was 8.9, hematocrit 30.2 and MCV low at 77. White count was mildly elevated at 411,000. She has underlying COPD and states she has baseline fatigue that her fatigue has become significantly worse over the last few months. She does continue to smoke and no she should stop but states it is very difficult.  She has not had a screening colonoscopy. She denies any rectal bleeding, dark or tarry stool. She states she has regular very heavy menstrual cycles. She needs to use both pads and tampons. The first day or 2 of her cycles she has to change her super plus tampon every hour.  She has pagophagia. She reports she eats ice constantly. She has installed an [employer] officer at home. She says she has had iron deficiency for many years but has been unable to tolerate multiple forms of oral iron. She is here today to discuss the possibility of IV iron replacement.   MEDICAL HISTORY:  Past Medical History  Diagnosis Date  . Hypertension   . COPD (chronic obstructive pulmonary disease)   . B12 deficiency     SURGICAL HISTORY: Past Surgical History  Procedure Laterality Date  . Tumor removed    . Bladder stretching    . Cholecystectomy    . Tendon repair      right thumb  . Hemorroidectomy      SOCIAL HISTORY: History   Social History  . Marital Status: Divorced    Spouse Name: N/A  . Number of Children: N/A  . Years of Education: N/A   Occupational History  . Not on file.   Social History Main Topics  . Smoking status: Current  Every Day Smoker  . Smokeless tobacco: Not on file  . Alcohol Use: Yes  . Drug Use: No  . Sexual Activity: Not on file   Other Topics Concern  . Not on file   Social History Narrative   she smokes 1-2 packs per day since the age of 60. She uses occasional alcohol. She works at [employer] and has for 19 years. She is divorced, has one child.   FAMILY HISTORY: Family History  Problem Relation Age of Onset  . Diabetes Other   . Cancer Mother     lung  . Brain cancer Mother     tumor   indicated that her mother is deceased. She indicated that her father is alive. She indicated that her sister is alive. She indicated that all of her three brothers are alive. She indicated that her son is alive. She indicated that her other is alive.    Her father is alive at the age of 68 and healthy. Her mother is deceased at the age of 75 from lung cancer. She developed brain metastases. She has one brother that is healthy. She has 2 half-brothers and 1 half-sister that are healthy.  ALLERGIES:  is allergic to sulfa antibiotics.  MEDICATIONS:  Current Outpatient Prescriptions  Medication Sig Dispense Refill  . albuterol (PROVENTIL HFA;VENTOLIN HFA) 108 (90 BASE) MCG/ACT inhaler Inhale 2 puffs  into the lungs every 6 (six) hours as needed for wheezing or shortness of breath. 1 Inhaler 2  . albuterol (PROVENTIL) (2.5 MG/3ML) 0.083% nebulizer solution Take 3 mLs (2.5 mg total) by nebulization every 6 (six) hours as needed for wheezing or shortness of breath. 150 mL 11  . BENICAR HCT 40-25 MG per tablet TAKE ONE TABLET BY MOUTH ONE TIME DAILY 30 tablet 4  . busPIRone (BUSPAR) 10 MG tablet Take 1 tablet (10 mg total) by mouth 3 (three) times daily. 90 tablet 1  . cyclobenzaprine (FLEXERIL) 10 MG tablet TAKE 1 TABLET (10 MG TOTAL) BY MOUTH 3 (THREE) TIMES DAILY AS NEEDED F OR MUSCLE SPASMS. 60 tablet 2  . ferrous fumarate (FERRO-SEQUELS) 50 MG CR tablet Take 50 mg by mouth 3 (three) times daily with  meals. Over the counter iron pills    . Fluticasone Furoate-Vilanterol (BREO ELLIPTA) 100-25 MCG/INH AEPB Inhale 1 Inhaler into the lungs 1 day or 1 dose. 1 each 11  . ibuprofen (ADVIL,MOTRIN) 200 MG tablet Take 800 mg by mouth every 6 (six) hours as needed. For pain     . meloxicam (MOBIC) 15 MG tablet Take 1 tablet (15 mg total) by mouth daily. 60 tablet 1  . omeprazole (PRILOSEC) 20 MG capsule TAKE 1 CAPSULE (20 MG TOTAL)   BY MOUTH AS NEEDED. FOR HEARTBURN 90 capsule 4  . Vitamin D, Ergocalciferol, (DRISDOL) 50000 UNITS CAPS capsule Take 1 capsule (50,000 Units total) by mouth every 7 (seven) days. 30 capsule 6  . fluticasone (FLONASE) 50 MCG/ACT nasal spray Place 2 sprays into both nostrils daily. (Patient not taking: Reported on 07/15/2014) 16 g 6  . pravastatin (PRAVACHOL) 40 MG tablet Take 1 tablet (40 mg total) by mouth daily. (Patient not taking: Reported on 07/15/2014) 90 tablet 3   Current Facility-Administered Medications  Medication Dose Route Frequency Provider Last Rate Last Dose  . levalbuterol (XOPENEX) nebulizer solution 1.25 mg  1.25 mg Nebulization Once Lysbeth Penner, FNP        Review of Systems  Constitutional: Positive for malaise/fatigue.  HENT: Negative.   Eyes: Negative.   Respiratory: Positive for shortness of breath.   Cardiovascular: Negative.   Gastrointestinal: Negative.   Genitourinary: Negative.   Musculoskeletal: Positive for joint pain.  Skin: Negative.   Neurological: Positive for weakness. Negative for dizziness, tingling, tremors, sensory change, speech change, focal weakness, seizures and loss of consciousness.  Endo/Heme/Allergies: Negative.   Psychiatric/Behavioral: Negative.     PHYSICAL EXAMINATION: ECOG PERFORMANCE STATUS: 1 - Symptomatic but completely ambulatory  Filed Vitals:   07/15/14 1500  BP: 132/75  Pulse: 95  Temp: 98.4 F (36.9 C)  Resp: 18   Filed Weights   07/15/14 1500  Weight: 244 lb 4.8 oz (110.814 kg)      Physical Exam  Constitutional: She is oriented to person, place, and time and well-developed, well-nourished, and in no distress.  HENT:  Head: Normocephalic and atraumatic.  Nose: Nose normal.  Mouth/Throat: Oropharynx is clear and moist. No oropharyngeal exudate.  Eyes: Conjunctivae and EOM are normal. Pupils are equal, round, and reactive to light. Right eye exhibits no discharge. Left eye exhibits no discharge. No scleral icterus.  Neck: Normal range of motion. Neck supple. No tracheal deviation present. No thyromegaly present.  Cardiovascular: Normal rate, regular rhythm and normal heart sounds.  Exam reveals no gallop and no friction rub.   No murmur heard. Pulmonary/Chest: Effort normal and breath sounds normal. She has no wheezes. She  has no rales.  Abdominal: Soft. Bowel sounds are normal. She exhibits no distension and no mass. There is no tenderness. There is no rebound and no guarding.  Musculoskeletal: Normal range of motion. She exhibits no edema.  Lymphadenopathy:    She has no cervical adenopathy.  Neurological: She is alert and oriented to person, place, and time. She has normal reflexes. No cranial nerve deficit. Gait normal. Coordination normal.  Skin: Skin is warm and dry. No rash noted.  Psychiatric: Mood, memory, affect and judgment normal.  Nursing note and vitals reviewed.    LABORATORY DATA:  I have reviewed the data as listed Lab Results  Component Value Date   WBC 9.1 06/06/2014   HGB 8.9* 06/06/2014   HCT 30.2* 06/06/2014   MCV 77* 06/06/2014   PLT 411* 06/06/2014     Chemistry      Component Value Date/Time   NA 139 06/06/2014 1134   NA 134* 02/09/2011 0224   K 4.5 06/06/2014 1134   CL 103 06/06/2014 1134   CO2 23 06/06/2014 1134   BUN 10 06/06/2014 1134   BUN 9 02/09/2011 0224   CREATININE 0.71 06/06/2014 1134      Component Value Date/Time   CALCIUM 9.2 06/06/2014 1134   ALKPHOS 89 06/06/2014 1134   AST 21 06/06/2014 1134   ALT 24  06/06/2014 1134   BILITOT 0.2 06/06/2014 1134      ASSESSMENT & PLAN:   Iron deficiency anemia Intolerance to oral iron Pagophagia COPD Ongoing menstrual cycles at the associated blood/iron losses  We have calculated her total iron deficit. We have discussed the risks and benefits of IV iron replacement. She is intolerant of oral iron and certainly at this point would benefit greatly from IV iron replacement given the severity of her anemia. I advised her the major risk of iron infusions with a new iron formulations is the possibility of infusion reaction.  She would like to proceed with IV iron replacement; we will arrange this for her. We will then follow her counts moving forward. I anticipate with adequate iron replacement and close observation of her ongoing iron needs; we can normalize her counts. I do not feel she currently needs a colonoscopy given her age and risk factors. Her anemia and iron deficiency are most likely secondary to her heavy menstrual cycles.  All questions were answered. The patient knows to call the clinic with any problems, questions or concerns.  This note was electronically signed.    Molli Hazard, MD  08/11/2014 8:32 AM

## 2014-07-18 ENCOUNTER — Ambulatory Visit (HOSPITAL_COMMUNITY): Payer: BLUE CROSS/BLUE SHIELD

## 2014-07-18 ENCOUNTER — Encounter (HOSPITAL_COMMUNITY): Payer: BLUE CROSS/BLUE SHIELD | Attending: Hematology & Oncology

## 2014-07-18 DIAGNOSIS — D509 Iron deficiency anemia, unspecified: Secondary | ICD-10-CM

## 2014-07-18 MED ORDER — SODIUM CHLORIDE 0.9 % IV SOLN
Freq: Once | INTRAVENOUS | Status: AC
  Administered 2014-07-18: 11:00:00 via INTRAVENOUS

## 2014-07-18 MED ORDER — SODIUM CHLORIDE 0.9 % IV SOLN
510.0000 mg | Freq: Once | INTRAVENOUS | Status: AC
  Administered 2014-07-18: 510 mg via INTRAVENOUS
  Filled 2014-07-18: qty 17

## 2014-07-18 NOTE — Progress Notes (Signed)
1230 tolerated iron infusion well.

## 2014-07-18 NOTE — Patient Instructions (Signed)
[facility] at [facility] Discharge Instructions  RECOMMENDATIONS MADE BY THE CONSULTANT AND ANY TEST RESULTS WILL BE SENT TO YOUR REFERRING PHYSICIAN.  Iron infusion today as ordered. Return as scheduled.  Thank you for choosing [facility] at [facility] to provide your oncology and hematology care.  To afford each patient quality time with our provider, please arrive at least 15 minutes before your scheduled appointment time.    You need to re-schedule your appointment should you arrive 10 or more minutes late.  We strive to give you quality time with our providers, and arriving late affects you and other patients whose appointments are after yours.  Also, if you no show three or more times for appointments you may be dismissed from the clinic at the providers discretion.     Again, thank you for choosing [facility].  Our hope is that these requests will decrease the amount of time that you wait before being seen by our physicians.       _____________________________________________________________  Should you have questions after your visit to [facility], please contact our office at [phone] between the hours of 8:30 a.m. and 4:30 p.m.  Voicemails left after 4:30 p.m. will not be returned until the following business day.  For prescription refill requests, have your pharmacy contact our office.    

## 2014-07-25 ENCOUNTER — Encounter (HOSPITAL_COMMUNITY): Payer: Self-pay

## 2014-07-25 ENCOUNTER — Encounter: Payer: Self-pay | Admitting: Physician Assistant

## 2014-07-25 ENCOUNTER — Encounter (HOSPITAL_BASED_OUTPATIENT_CLINIC_OR_DEPARTMENT_OTHER): Payer: BLUE CROSS/BLUE SHIELD

## 2014-07-25 DIAGNOSIS — D509 Iron deficiency anemia, unspecified: Secondary | ICD-10-CM

## 2014-07-25 MED ORDER — SODIUM CHLORIDE 0.9 % IV SOLN
Freq: Once | INTRAVENOUS | Status: AC
  Administered 2014-07-25: 12:00:00 via INTRAVENOUS

## 2014-07-25 MED ORDER — SODIUM CHLORIDE 0.9 % IV SOLN
510.0000 mg | Freq: Once | INTRAVENOUS | Status: AC
  Administered 2014-07-25: 510 mg via INTRAVENOUS
  Filled 2014-07-25: qty 17

## 2014-07-25 NOTE — Progress Notes (Signed)
Tolerated Iron infusion without any problems.

## 2014-07-25 NOTE — Patient Instructions (Signed)
[place] at [facility] Discharge Instructions  RECOMMENDATIONS MADE BY THE CONSULTANT AND ANY TEST RESULTS WILL BE SENT TO YOUR REFERRING PHYSICIAN.  Today you received Feraheme 510mg . Return on 3/29 @ 11.  Thank you for choosing [place] at [facility] to provide your oncology and hematology care.  To afford each patient quality time with our provider, please arrive at least 15 minutes before your scheduled appointment time.    You need to re-schedule your appointment should you arrive 10 or more minutes late.  We strive to give you quality time with our providers, and arriving late affects you and other patients whose appointments are after yours.  Also, if you no show three or more times for appointments you may be dismissed from the clinic at the providers discretion.     Again, thank you for choosing [facility].  Our hope is that these requests will decrease the amount of time that you wait before being seen by our physicians.       _____________________________________________________________  Should you have questions after your visit to [facility], please contact our office at [phone] between the hours of 8:30 a.m. and 4:30 p.m.  Voicemails left after 4:30 p.m. will not be returned until the following business day.  For prescription refill requests, have your pharmacy contact our office.

## 2014-08-02 ENCOUNTER — Encounter (HOSPITAL_BASED_OUTPATIENT_CLINIC_OR_DEPARTMENT_OTHER): Payer: BLUE CROSS/BLUE SHIELD

## 2014-08-02 DIAGNOSIS — D509 Iron deficiency anemia, unspecified: Secondary | ICD-10-CM

## 2014-08-02 MED ORDER — SODIUM CHLORIDE 0.9 % IV SOLN
510.0000 mg | Freq: Once | INTRAVENOUS | Status: AC
  Administered 2014-08-02: 510 mg via INTRAVENOUS
  Filled 2014-08-02: qty 17

## 2014-08-02 MED ORDER — SODIUM CHLORIDE 0.9 % IV SOLN
Freq: Once | INTRAVENOUS | Status: AC
  Administered 2014-08-02: 11:00:00 via INTRAVENOUS

## 2014-08-02 NOTE — Patient Instructions (Signed)
[place] at [facility] Discharge Instructions  RECOMMENDATIONS MADE BY THE CONSULTANT AND ANY TEST RESULTS WILL BE SENT TO YOUR REFERRING PHYSICIAN.  IV iron infusion today as ordered. Return as scheduled.  Thank you for choosing [place] at [facility] to provide your oncology and hematology care.  To afford each patient quality time with our provider, please arrive at least 15 minutes before your scheduled appointment time.    You need to re-schedule your appointment should you arrive 10 or more minutes late.  We strive to give you quality time with our providers, and arriving late affects you and other patients whose appointments are after yours.  Also, if you no show three or more times for appointments you may be dismissed from the clinic at the providers discretion.     Again, thank you for choosing [facility].  Our hope is that these requests will decrease the amount of time that you wait before being seen by our physicians.       _____________________________________________________________  Should you have questions after your visit to [facility], please contact our office at [phone] between the hours of 8:30 a.m. and 4:30 p.m.  Voicemails left after 4:30 p.m. will not be returned until the following business day.  For prescription refill requests, have your pharmacy contact our office.

## 2014-08-02 NOTE — Progress Notes (Signed)
Tolerated iron infusion well. 

## 2014-08-27 NOTE — Assessment & Plan Note (Signed)
secondary to heavy menstrual cycles with intolerance to PO iron replacement; requiring IV iron replacement.  S/P 510 mg of IV Feraheme x3 from 3/14- 08/02/2014.  Labs today: CBC diff, iron/TIBC, ferritin.  Labs today, 6 weeks, and 12 weeks: CBC diff, iron/TIBC, ferritin.  Return in 12 weeks for follow-up.

## 2014-08-27 NOTE — Progress Notes (Signed)
-  Rescheduled-  Hayley Crawford  

## 2014-08-29 ENCOUNTER — Encounter (HOSPITAL_COMMUNITY): Payer: BLUE CROSS/BLUE SHIELD | Admitting: Hematology & Oncology

## 2014-08-29 ENCOUNTER — Other Ambulatory Visit (HOSPITAL_COMMUNITY): Payer: BLUE CROSS/BLUE SHIELD

## 2014-08-29 ENCOUNTER — Ambulatory Visit (HOSPITAL_COMMUNITY): Payer: BLUE CROSS/BLUE SHIELD | Admitting: Hematology & Oncology

## 2014-08-29 ENCOUNTER — Ambulatory Visit (HOSPITAL_COMMUNITY): Payer: BLUE CROSS/BLUE SHIELD | Admitting: Oncology

## 2014-08-31 ENCOUNTER — Other Ambulatory Visit (HOSPITAL_COMMUNITY): Payer: Self-pay

## 2014-09-05 NOTE — Assessment & Plan Note (Signed)
secondary to heavy menstrual cycles with intolerance to PO iron replacement; requiring IV iron replacement.  S/P 510 mg of IV Feraheme x3 from 3/14- 08/02/2014.  Labs today: CBC diff, iron/TIBC, ferritin.  Labs today, 6 weeks, and 12 weeks: CBC diff, iron/TIBC, ferritin.  Return in 12 weeks for follow-up.

## 2014-09-05 NOTE — Progress Notes (Signed)
Hayley Crawford, [place] [address]  Iron deficiency anemia  CURRENT THERAPY:IV iron replacement  Oncology Flowsheet 07/18/2014 07/25/2014 08/02/2014  ferumoxytol (FERAHEME) IV 510 mg 510 mg 510 mg    INTERVAL HISTORY: Hayley Crawford 49 y.o. female returns for followup of iron deficiency anemia secondary to heavy menstrual cycles with intolerance to PO iron replacement; requiring IV iron replacement.  I personally reviewed and went over laboratory results with the patient.  The results are noted within this dictation.  Her Hgb is WNL today.  Iron studies are pending.  She reports that she feels tired and fatigued.  She notes that this fatigue started a few weeks ago.  We will keep an eye out for her iron studies.   Hematologically, she denies any complaints and ROS questioning is negative.   Past Medical History  Diagnosis Date  . Hypertension   . COPD (chronic obstructive pulmonary disease)   . B12 deficiency     has GAD (generalized anxiety disorder); Depression; GERD (gastroesophageal reflux disease); Hyperlipidemia; Vitamin B 12 deficiency; Hypertension; Aches; COPD (chronic obstructive pulmonary disease); Tobacco user; Vitamin D deficiency; and Iron deficiency anemia on her problem list.     is allergic to sulfa antibiotics.  Hayley Crawford had no medications administered during this visit.  Past Surgical History  Procedure Laterality Date  . Tumor removed    . Bladder stretching    . Cholecystectomy    . Tendon repair      right thumb  . Hemorroidectomy      Denies any headaches, dizziness, double vision, fevers, chills, night sweats, nausea, vomiting, diarrhea, constipation, chest pain, heart palpitations, shortness of breath, blood in stool, black tarry stool, urinary pain, urinary burning, urinary frequency, hematuria.   PHYSICAL EXAMINATION  ECOG PERFORMANCE STATUS: 1 - Symptomatic but completely ambulatory  Filed Vitals:   09/06/14 1504  BP: 115/73  Pulse: 92  Temp: 99 F (37.2 C)  Resp: 18    GENERAL:alert, no distress, well nourished, well developed, comfortable, cooperative and smiling SKIN: skin color, texture, turgor are normal, no rashes or significant lesions HEAD: Normocephalic, No masses, lesions, tenderness or abnormalities EYES: normal, PERRLA, EOMI, Conjunctiva are pink and non-injected EARS: External ears normal OROPHARYNX:lips, buccal mucosa, and tongue normal and mucous membranes are moist  NECK: supple, no adenopathy, thyroid normal size, non-tender, without nodularity, trachea midline LYMPH:  not examined BREAST:not examined LUNGS: clear to auscultation  HEART: regular rate & rhythm, no murmurs and no gallops ABDOMEN:abdomen soft, non-tender and normal bowel sounds BACK: Back symmetric, no curvature. EXTREMITIES:less then 2 second capillary refill, no joint deformities, effusion, or inflammation, no skin discoloration, no clubbing, no cyanosis  NEURO: alert & oriented x 3 with fluent speech, no focal motor/sensory deficits, gait normal   LABORATORY DATA: CBC    Component Value Date/Time   WBC 8.5 09/06/2014 1450   WBC 9.1 06/06/2014 1134   WBC 10.4* 04/09/2013 1652   RBC 4.40 09/06/2014 1450   RBC 3.93 06/06/2014 1134   RBC 4.2 04/09/2013 1652   HGB 13.5 09/06/2014 1450   HGB 9.4* 04/09/2013 1652   HCT 40.6 09/06/2014 1450   HCT 31.3* 04/09/2013 1652   PLT 289 09/06/2014 1450   MCV 92.3 09/06/2014 1450   MCV 75.1* 04/09/2013 1652   MCH 30.7 09/06/2014 1450   MCH 22.6* 06/06/2014 1134   MCH 22.5* 04/09/2013 1652   MCHC 33.3 09/06/2014 1450   MCHC  29.5* 06/06/2014 1134   MCHC 30.0* 04/09/2013 1652   RDW 20.1* 09/06/2014 1450   RDW 18.2* 06/06/2014 1134   LYMPHSABS 2.1 09/06/2014 1450   LYMPHSABS 2.1 06/06/2014 1134   MONOABS 0.6 09/06/2014 1450   EOSABS 0.2 09/06/2014 1450   EOSABS 0.3 06/06/2014 1134   BASOSABS 0.0 09/06/2014 1450   BASOSABS 0.1 06/06/2014 1134       Chemistry      Component Value Date/Time   NA 139 06/06/2014 1134   NA 134* 02/09/2011 0224   K 4.5 06/06/2014 1134   CL 103 06/06/2014 1134   CO2 23 06/06/2014 1134   BUN 10 06/06/2014 1134   BUN 9 02/09/2011 0224   CREATININE 0.71 06/06/2014 1134      Component Value Date/Time   CALCIUM 9.2 06/06/2014 1134   ALKPHOS 89 06/06/2014 1134   AST 21 06/06/2014 1134   ALT 24 06/06/2014 1134   BILITOT 0.2 06/06/2014 1134        ASSESSMENT AND PLAN:  Iron deficiency anemia secondary to heavy menstrual cycles with intolerance to PO iron replacement; requiring IV iron replacement.  S/P 510 mg of IV Feraheme x3 from 3/14- 08/02/2014.  Labs today: CBC diff, iron/TIBC, ferritin.  Labs today, 6 weeks, and 12 weeks: CBC diff, iron/TIBC, ferritin.  Return in 12 weeks for follow-up.   THERAPY PLAN:  We will continue to monitor iron studies.  All questions were answered. The patient knows to call the clinic with any problems, questions or concerns. We can certainly see the patient much sooner if necessary.  Patient and plan discussed with Dr. Ancil Linsey and she is in agreement with the aforementioned.   This note is electronically signed by: Robynn Pane 09/06/2014 3:58 PM

## 2014-09-06 ENCOUNTER — Encounter (HOSPITAL_COMMUNITY): Payer: BLUE CROSS/BLUE SHIELD

## 2014-09-06 ENCOUNTER — Encounter (HOSPITAL_COMMUNITY): Payer: Self-pay | Admitting: Oncology

## 2014-09-06 ENCOUNTER — Encounter (HOSPITAL_COMMUNITY): Payer: BLUE CROSS/BLUE SHIELD | Attending: Oncology | Admitting: Oncology

## 2014-09-06 VITALS — BP 115/73 | HR 92 | Temp 99.0°F | Resp 18 | Wt 249.4 lb

## 2014-09-06 DIAGNOSIS — D509 Iron deficiency anemia, unspecified: Secondary | ICD-10-CM | POA: Diagnosis not present

## 2014-09-06 LAB — CBC WITH DIFFERENTIAL/PLATELET
BASOS PCT: 0 % (ref 0–1)
Basophils Absolute: 0 10*3/uL (ref 0.0–0.1)
EOS ABS: 0.2 10*3/uL (ref 0.0–0.7)
Eosinophils Relative: 2 % (ref 0–5)
HCT: 40.6 % (ref 36.0–46.0)
Hemoglobin: 13.5 g/dL (ref 12.0–15.0)
Lymphocytes Relative: 25 % (ref 12–46)
Lymphs Abs: 2.1 10*3/uL (ref 0.7–4.0)
MCH: 30.7 pg (ref 26.0–34.0)
MCHC: 33.3 g/dL (ref 30.0–36.0)
MCV: 92.3 fL (ref 78.0–100.0)
MONOS PCT: 7 % (ref 3–12)
Monocytes Absolute: 0.6 10*3/uL (ref 0.1–1.0)
NEUTROS ABS: 5.7 10*3/uL (ref 1.7–7.7)
NEUTROS PCT: 66 % (ref 43–77)
PLATELETS: 289 10*3/uL (ref 150–400)
RBC: 4.4 MIL/uL (ref 3.87–5.11)
RDW: 20.1 % — ABNORMAL HIGH (ref 11.5–15.5)
WBC: 8.5 10*3/uL (ref 4.0–10.5)

## 2014-09-06 LAB — IRON AND TIBC
Iron: 100 ug/dL (ref 28–170)
Saturation Ratios: 27 % (ref 10.4–31.8)
TIBC: 375 ug/dL (ref 250–450)
UIBC: 275 ug/dL

## 2014-09-06 LAB — FERRITIN: Ferritin: 100 ng/mL (ref 11–307)

## 2014-09-06 NOTE — Progress Notes (Signed)
See office visit

## 2014-09-06 NOTE — Patient Instructions (Signed)
..  [place] at [facility] Discharge Instructions  RECOMMENDATIONS MADE BY THE CONSULTANT AND ANY TEST RESULTS WILL BE SENT TO YOUR REFERRING PHYSICIAN.  Return in 6 weeks and 12 weeks for labs Return in 12 weeks to see Gershon Mussel  Thank you for choosing [facility] at [facility] to provide your oncology and hematology care.  To afford each patient quality time with our provider, please arrive at least 15 minutes before your scheduled appointment time.    You need to re-schedule your appointment should you arrive 10 or more minutes late.  We strive to give you quality time with our providers, and arriving late affects you and other patients whose appointments are after yours.  Also, if you no show three or more times for appointments you may be dismissed from the clinic at the providers discretion.     Again, thank you for choosing [facility].  Our hope is that these requests will decrease the amount of time that you wait before being seen by our physicians.       _____________________________________________________________  Should you have questions after your visit to [facility], please contact our office at [phone] between the hours of 8:30 a.m. and 4:30 p.m.  Voicemails left after 4:30 p.m. will not be returned until the following business day.  For prescription refill requests, have your pharmacy contact our office.

## 2014-09-06 NOTE — Progress Notes (Signed)
Hayley Crawford presented for labwork. Labs per MD order drawn via Peripheral Line 23 gauge needle inserted in right AC  Good blood return present. Procedure without incident.  Needle removed intact. Patient tolerated procedure well.

## 2014-09-07 ENCOUNTER — Other Ambulatory Visit (HOSPITAL_COMMUNITY): Payer: Self-pay | Admitting: *Deleted

## 2014-09-07 ENCOUNTER — Other Ambulatory Visit (HOSPITAL_COMMUNITY): Payer: Self-pay | Admitting: Oncology

## 2014-09-07 DIAGNOSIS — D509 Iron deficiency anemia, unspecified: Secondary | ICD-10-CM

## 2014-09-08 ENCOUNTER — Telehealth: Payer: Self-pay | Admitting: Family

## 2014-09-08 MED ORDER — PROMETHAZINE HCL 12.5 MG PO TABS: 12.5000 mg | ORAL_TABLET | Freq: Three times a day (TID) | ORAL | Status: DC | PRN

## 2014-09-08 NOTE — Telephone Encounter (Signed)
Prescription sent to pharmacy.

## 2014-09-09 ENCOUNTER — Ambulatory Visit (HOSPITAL_COMMUNITY): Payer: BLUE CROSS/BLUE SHIELD

## 2014-09-12 ENCOUNTER — Encounter: Payer: Self-pay | Admitting: Family

## 2014-09-12 ENCOUNTER — Ambulatory Visit (INDEPENDENT_AMBULATORY_CARE_PROVIDER_SITE_OTHER): Payer: BLUE CROSS/BLUE SHIELD | Admitting: Family

## 2014-09-12 VITALS — BP 112/69 | HR 89 | Temp 97.8°F | Ht 62.0 in | Wt 248.4 lb

## 2014-09-12 DIAGNOSIS — Z720 Tobacco use: Secondary | ICD-10-CM | POA: Diagnosis not present

## 2014-09-12 DIAGNOSIS — F329 Major depressive disorder, single episode, unspecified: Secondary | ICD-10-CM | POA: Diagnosis not present

## 2014-09-12 DIAGNOSIS — Z716 Tobacco abuse counseling: Secondary | ICD-10-CM

## 2014-09-12 DIAGNOSIS — F32A Depression, unspecified: Secondary | ICD-10-CM

## 2014-09-12 DIAGNOSIS — D509 Iron deficiency anemia, unspecified: Secondary | ICD-10-CM | POA: Diagnosis not present

## 2014-09-12 DIAGNOSIS — E559 Vitamin D deficiency, unspecified: Secondary | ICD-10-CM | POA: Diagnosis not present

## 2014-09-12 DIAGNOSIS — K219 Gastro-esophageal reflux disease without esophagitis: Secondary | ICD-10-CM | POA: Diagnosis not present

## 2014-09-12 DIAGNOSIS — E785 Hyperlipidemia, unspecified: Secondary | ICD-10-CM | POA: Diagnosis not present

## 2014-09-12 DIAGNOSIS — J449 Chronic obstructive pulmonary disease, unspecified: Secondary | ICD-10-CM | POA: Diagnosis not present

## 2014-09-12 DIAGNOSIS — F411 Generalized anxiety disorder: Secondary | ICD-10-CM | POA: Diagnosis not present

## 2014-09-12 DIAGNOSIS — E538 Deficiency of other specified B group vitamins: Secondary | ICD-10-CM

## 2014-09-12 DIAGNOSIS — I10 Essential (primary) hypertension: Secondary | ICD-10-CM

## 2014-09-12 MED ORDER — VARENICLINE TARTRATE 1 MG PO TABS: 1.0000 mg | ORAL_TABLET | Freq: Two times a day (BID) | ORAL | Status: DC

## 2014-09-12 MED ORDER — VARENICLINE TARTRATE 0.5 MG X 11 & 1 MG X 42 PO MISC: ORAL | Status: DC

## 2014-09-12 NOTE — Progress Notes (Signed)
Subjective:    Patient ID: Hayley Crawford, female    DOB: 1966-04-09, 49 y.o.   MRN: [id number]  Hypertension This is a chronic problem. The current episode started more than 1 year ago. The problem has been resolved since onset. The problem is controlled. Associated symptoms include anxiety, malaise/fatigue, palpitations ("if i get really upset") and peripheral edema ("trace amount in ankles"). Pertinent negatives include no chest pain, headaches or shortness of breath. Risk factors for coronary artery disease include dyslipidemia, obesity, post-menopausal state, family history and smoking/tobacco exposure. Past treatments include diuretics and angiotensin blockers. The current treatment provides significant improvement. There is no history of kidney disease, CAD/MI, CVA, heart failure or a thyroid problem. Identifiable causes of hypertension include sleep apnea.  Hyperlipidemia This is a chronic problem. The current episode started more than 1 year ago. The problem is uncontrolled. Recent lipid tests were reviewed and are high. Exacerbating diseases include obesity. She has no history of diabetes or hypothyroidism. Factors aggravating her hyperlipidemia include smoking. Pertinent negatives include no chest pain or shortness of breath. She is currently on no antihyperlipidemic treatment. The current treatment provides no improvement of lipids. Compliance problems include adherence to diet.  Risk factors for coronary artery disease include dyslipidemia, family history, hypertension and a sedentary lifestyle.  Gastrophageal Reflux She reports no chest pain, no coughing, no heartburn, no sore throat or no wheezing. This is a chronic problem. The current episode started more than 1 year ago. The problem occurs rarely. The symptoms are aggravated by certain foods. Pertinent negatives include no fatigue or muscle weakness. She has tried a PPI for the symptoms. The treatment provided significant relief.   Anxiety Presents for follow-up visit. Onset was 1 to 6 months ago. Symptoms include excessive worry, nervous/anxious behavior and palpitations ("if i get really upset"). Patient reports no chest pain, decreased concentration, depressed mood, insomnia, panic or shortness of breath. Symptoms occur occasionally.   Her past medical history is significant for anemia and anxiety/panic attacks. There is no history of depression.  Anemia Presents for follow-up visit. Symptoms include malaise/fatigue and palpitations ("if i get really upset"). There has been no bruising/bleeding easily. Past treatments include changes in diet and parenteral iron supplements. There is no history of heart failure or hypothyroidism. Family history includes iron deficiency.   *Pt see's a hematologists about her iron deficiency anemia and is getting her IV infusions.    Review of Systems  Constitutional: Positive for malaise/fatigue. Negative for fatigue.  HENT: Negative.  Negative for sore throat.   Eyes: Negative.   Respiratory: Negative.  Negative for cough, shortness of breath and wheezing.   Cardiovascular: Positive for palpitations ("if i get really upset"). Negative for chest pain.  Gastrointestinal: Negative.  Negative for heartburn.  Endocrine: Negative.   Genitourinary: Negative.   Musculoskeletal: Negative.  Negative for muscle weakness.  Neurological: Negative.  Negative for headaches.  Hematological: Negative.  Does not bruise/bleed easily.  Psychiatric/Behavioral: Negative for decreased concentration. The patient is nervous/anxious. The patient does not have insomnia.   All other systems reviewed and are negative.      Objective:   Physical Exam  Constitutional: She is oriented to person, place, and time. She appears well-developed and well-nourished. No distress.  HENT:  Head: Normocephalic and atraumatic.  Right Ear: External ear normal.  Left Ear: External ear normal.  Nose: Nose normal.   Mouth/Throat: Oropharynx is clear and moist.  Eyes: Pupils are equal, round, and reactive to light.  Neck:  Normal range of motion. Neck supple. No thyromegaly present.  Cardiovascular: Normal rate, regular rhythm, normal heart sounds and intact distal pulses.   No murmur heard. Pulmonary/Chest: Effort normal and breath sounds normal. No respiratory distress. She has no wheezes.  Abdominal: Soft. Bowel sounds are normal. She exhibits no distension. There is no tenderness.  Musculoskeletal: Normal range of motion. She exhibits no edema or tenderness.  Neurological: She is alert and oriented to person, place, and time. She has normal reflexes. No cranial nerve deficit.  Skin: Skin is warm and dry.  Psychiatric: She has a normal mood and affect. Her behavior is normal. Judgment and thought content normal.  Vitals reviewed.     BP 112/69 mmHg  Pulse 89  Temp(Src) 97.8 F (36.6 C) (Oral)  Ht 5\' 2"  (1.575 m)  Wt 248 lb 6.4 oz (112.674 kg)  BMI 45.42 kg/m2     Assessment & Plan:  1. Essential hypertension  2. Chronic obstructive pulmonary disease, unspecified COPD, unspecified chronic bronchitis type  3. Gastroesophageal reflux disease, esophagitis presence not specified  4. Vitamin B 12 deficiency  5. GAD (generalized anxiety disorder)  6. Depression  7. Hyperlipidemia  8. Vitamin D deficiency  9. Iron deficiency anemia  10. Encounter for smoking cessation counseling - varenicline (CHANTIX CONTINUING MONTH PAK) 1 MG tablet; Take 1 tablet (1 mg total) by mouth 2 (two) times daily.  Dispense: 60 tablet; Refill: 2 - varenicline (CHANTIX STARTING MONTH PAK) 0.5 MG X 11 & 1 MG X 42 tablet; Take one 0.5 mg tablet by mouth once daily for 3 days, then increase to one 0.5 mg tablet twice daily for 4 days, then increase to one 1 mg tablet twice daily.  Dispense: 53 tablet; Refill: 0   Continue all meds Pt would like to hold off on lab on today' visit- States she will get "blood  work drawn on her next visit" Health Maintenance reviewed Diet and exercise encouraged RTO 3 month   [name], FNP

## 2014-09-12 NOTE — Patient Instructions (Addendum)
Health Maintenance Adopting a healthy lifestyle and getting preventive care can go a long way to promote health and wellness. Talk with your health care provider about what schedule of regular examinations is right for you. This is a good chance for you to check in with your provider about disease prevention and staying healthy. In between checkups, there are plenty of things you can do on your own. Experts have done a lot of research about which lifestyle changes and preventive measures are most likely to keep you healthy. Ask your health care provider for more information. WEIGHT AND DIET  Eat a healthy diet  Be sure to include plenty of vegetables, fruits, low-fat dairy products, and lean protein.  Do not eat a lot of foods high in solid fats, added sugars, or salt.  Get regular exercise. This is one of the most important things you can do for your health.  Most adults should exercise for at least 150 minutes each week. The exercise should increase your heart rate and make you sweat (moderate-intensity exercise).  Most adults should also do strengthening exercises at least twice a week. This is in addition to the moderate-intensity exercise.  Maintain a healthy weight  Body mass index (BMI) is a measurement that can be used to identify possible weight problems. It estimates body fat based on height and weight. Your health care provider can help determine your BMI and help you achieve or maintain a healthy weight.  For females 25 years of age and older:   A BMI below 18.5 is considered underweight.  A BMI of 18.5 to 24.9 is normal.  A BMI of 25 to 29.9 is considered overweight.  A BMI of 30 and above is considered obese.  Watch levels of cholesterol and blood lipids  You should start having your blood tested for lipids and cholesterol at 51 years of age, then have this test every 5 years.  You may need to have your cholesterol levels checked more often if:  Your lipid or  cholesterol levels are high.  You are older than 51 years of age.  You are at high risk for heart disease.  CANCER SCREENING   Lung Cancer  Lung cancer screening is recommended for adults 97-92 years old who are at high risk for lung cancer because of a history of smoking.  A yearly low-dose CT scan of the lungs is recommended for people who:  Currently smoke.  Have quit within the past 15 years.  Have at least a 30-pack-year history of smoking. A pack year is smoking an average of one pack of cigarettes a day for 1 year.  Yearly screening should continue until it has been 15 years since you quit.  Yearly screening should stop if you develop a health problem that would prevent you from having lung cancer treatment.  Breast Cancer  Practice breast self-awareness. This means understanding how your breasts normally appear and feel.  It also means doing regular breast self-exams. Let your health care provider know about any changes, no matter how small.  If you are in your 20s or 30s, you should have a clinical breast exam (CBE) by a health care provider every 1-3 years as part of a regular health exam.  If you are 76 or older, have a CBE every year. Also consider having a breast X-ray (mammogram) every year.  If you have a family history of breast cancer, talk to your health care provider about genetic screening.  If you are  at high risk for breast cancer, talk to your health care provider about having an MRI and a mammogram every year.  Breast cancer gene (BRCA) assessment is recommended for women who have family members with BRCA-related cancers. BRCA-related cancers include:  Breast.  Ovarian.  Tubal.  Peritoneal cancers.  Results of the assessment will determine the need for genetic counseling and BRCA1 and BRCA2 testing. Cervical Cancer Routine pelvic examinations to screen for cervical cancer are no longer recommended for nonpregnant women who are considered low  risk for cancer of the pelvic organs (ovaries, uterus, and vagina) and who do not have symptoms. A pelvic examination may be necessary if you have symptoms including those associated with pelvic infections. Ask your health care provider if a screening pelvic exam is right for you.   The Pap test is the screening test for cervical cancer for women who are considered at risk.  If you had a hysterectomy for a problem that was not cancer or a condition that could lead to cancer, then you no longer need Pap tests.  If you are older than 65 years, and you have had normal Pap tests for the past 10 years, you no longer need to have Pap tests.  If you have had past treatment for cervical cancer or a condition that could lead to cancer, you need Pap tests and screening for cancer for at least 20 years after your treatment.  If you no longer get a Pap test, assess your risk factors if they change (such as having a new sexual partner). This can affect whether you should start being screened again.  Some women have medical problems that increase their chance of getting cervical cancer. If this is the case for you, your health care provider may recommend more frequent screening and Pap tests.  The human papillomavirus (HPV) test is another test that may be used for cervical cancer screening. The HPV test looks for the virus that can cause cell changes in the cervix. The cells collected during the Pap test can be tested for HPV.  The HPV test can be used to screen women 30 years of age and older. Getting tested for HPV can extend the interval between normal Pap tests from three to five years.  An HPV test also should be used to screen women of any age who have unclear Pap test results.  After 51 years of age, women should have HPV testing as often as Pap tests.  Colorectal Cancer  This type of cancer can be detected and often prevented.  Routine colorectal cancer screening usually begins at 50 years of  age and continues through 51 years of age.  Your health care provider may recommend screening at an earlier age if you have risk factors for colon cancer.  Your health care provider may also recommend using home test kits to check for hidden blood in the stool.  A small camera at the end of a tube can be used to examine your colon directly (sigmoidoscopy or colonoscopy). This is done to check for the earliest forms of colorectal cancer.  Routine screening usually begins at age 50.  Direct examination of the colon should be repeated every 5-10 years through 51 years of age. However, you may need to be screened more often if early forms of precancerous polyps or small growths are found. Skin Cancer  Check your skin from head to toe regularly.  Tell your health care provider about any new moles or changes in   moles, especially if there is a change in a mole's shape or color.  Also tell your health care provider if you have a mole that is larger than the size of a pencil eraser.  Always use sunscreen. Apply sunscreen liberally and repeatedly throughout the day.  Protect yourself by wearing long sleeves, pants, a wide-brimmed hat, and sunglasses whenever you are outside. HEART DISEASE, DIABETES, AND HIGH BLOOD PRESSURE   Have your blood pressure checked at least every 1-2 years. High blood pressure causes heart disease and increases the risk of stroke.  If you are between 75 years and 42 years old, ask your health care provider if you should take aspirin to prevent strokes.  Have regular diabetes screenings. This involves taking a blood sample to check your fasting blood sugar level.  If you are at a normal weight and have a low risk for diabetes, have this test once every three years after 51 years of age.  If you are overweight and have a high risk for diabetes, consider being tested at a younger age or more often. PREVENTING INFECTION  Hepatitis B  If you have a higher risk for  hepatitis B, you should be screened for this virus. You are considered at high risk for hepatitis B if:  You were born in a country where hepatitis B is common. Ask your health care provider which countries are considered high risk.  Your parents were born in a high-risk country, and you have not been immunized against hepatitis B (hepatitis B vaccine).  You have HIV or AIDS.  You use needles to inject street drugs.  You live with someone who has hepatitis B.  You have had sex with someone who has hepatitis B.  You get hemodialysis treatment.  You take certain medicines for conditions, including cancer, organ transplantation, and autoimmune conditions. Hepatitis C  Blood testing is recommended for:  Everyone born from [birth year] through 1967.  Anyone with known risk factors for hepatitis C. Sexually transmitted infections (STIs)  You should be screened for sexually transmitted infections (STIs) including gonorrhea and chlamydia if:  You are sexually active and are younger than 51 years of age.  You are older than 51 years of age and your health care provider tells you that you are at risk for this type of infection.  Your sexual activity has changed since you were last screened and you are at an increased risk for chlamydia or gonorrhea. Ask your health care provider if you are at risk.  If you do not have HIV, but are at risk, it may be recommended that you take a prescription medicine daily to prevent HIV infection. This is called pre-exposure prophylaxis (PrEP). You are considered at risk if:  You are sexually active and do not regularly use condoms or know the HIV status of your partner(s).  You take drugs by injection.  You are sexually active with a partner who has HIV. Talk with your health care provider about whether you are at high risk of being infected with HIV. If you choose to begin PrEP, you should first be tested for HIV. You should then be tested every 3 months for  as long as you are taking PrEP.  PREGNANCY   If you are premenopausal and you may become pregnant, ask your health care provider about preconception counseling.  If you may become pregnant, take 400 to 800 micrograms (mcg) of folic acid every day.  If you want to prevent pregnancy, talk to your  health care provider about birth control (contraception). OSTEOPOROSIS AND MENOPAUSE   Osteoporosis is a disease in which the bones lose minerals and strength with aging. This can result in serious bone fractures. Your risk for osteoporosis can be identified using a bone density scan.  If you are 46 years of age or older, or if you are at risk for osteoporosis and fractures, ask your health care provider if you should be screened.  Ask your health care provider whether you should take a calcium or vitamin D supplement to lower your risk for osteoporosis.  Menopause may have certain physical symptoms and risks.  Hormone replacement therapy may reduce some of these symptoms and risks. Talk to your health care provider about whether hormone replacement therapy is right for you.  HOME CARE INSTRUCTIONS   Schedule regular health, dental, and eye exams.  Stay current with your immunizations.   Do not use any tobacco products including cigarettes, chewing tobacco, or electronic cigarettes.  If you are pregnant, do not drink alcohol.  If you are breastfeeding, limit how much and how often you drink alcohol.  Limit alcohol intake to no more than 1 drink per day for nonpregnant women. One drink equals 12 ounces of beer, 5 ounces of wine, or 1 ounces of hard liquor.  Do not use street drugs.  Do not share needles.  Ask your health care provider for help if you need support or information about quitting drugs.  Tell your health care provider if you often feel depressed.  Tell your health care provider if you have ever been abused or do not feel safe at home. Document Released: 11/05/2010  Document Revised: 09/06/2013 Document Reviewed: 03/24/2013 [facility] Patient Information 2015 [place], [place]. This information is not intended to replace advice given to you by your health care provider. Make sure you discuss any questions you have with your health care provider. Iron Deficiency Anemia Anemia is a condition in which there are less red blood cells or hemoglobin in the blood than normal. Hemoglobin is the part of red blood cells that carries oxygen. Iron deficiency anemia is anemia caused by too little iron. It is the most common type of anemia. It may leave you tired and short of breath. CAUSES   Lack of iron in the diet.  Poor absorption of iron, as seen with intestinal disorders.  Intestinal bleeding.  Heavy periods. SIGNS AND SYMPTOMS  Mild anemia may not be noticeable. Symptoms may include:  Fatigue.  Headache.  Pale skin.  Weakness.  Tiredness.  Shortness of breath.  Dizziness.  Cold hands and feet.  Fast or irregular heartbeat. DIAGNOSIS  Diagnosis requires a thorough evaluation and physical exam by your health care provider. Blood tests are generally used to confirm iron deficiency anemia. Additional tests may be done to find the underlying cause of your anemia. These may include:  Testing for blood in the stool (fecal occult blood test).  A procedure to see inside the colon and rectum (colonoscopy).  A procedure to see inside the esophagus and stomach (endoscopy). TREATMENT  Iron deficiency anemia is treated by correcting the cause of the deficiency. Treatment may involve:  Adding iron-rich foods to your diet.  Taking iron supplements. Pregnant or breastfeeding women need to take extra iron because their normal diet usually does not provide the required amount.  Taking vitamins. Vitamin C improves the absorption of iron. Your health care provider may recommend that you take your iron tablets with a glass of orange juice  or vitamin C  supplement.  Medicines to make heavy menstrual flow lighter.  Surgery. HOME CARE INSTRUCTIONS   Take iron as directed by your health care provider.  If you cannot tolerate taking iron supplements by mouth, talk to your health care provider about taking them through a vein (intravenously) or an injection into a muscle.  For the best iron absorption, iron supplements should be taken on an empty stomach. If you cannot tolerate them on an empty stomach, you may need to take them with food.  Do not drink milk or take antacids at the same time as your iron supplements. Milk and antacids may interfere with the absorption of iron.  Iron supplements can cause constipation. Make sure to include fiber in your diet to prevent constipation. A stool softener may also be recommended.  Take vitamins as directed by your health care provider.  Eat a diet rich in iron. Foods high in iron include liver, lean beef, whole-grain bread, eggs, dried fruit, and dark green leafy vegetables. SEEK IMMEDIATE MEDICAL CARE IF:   You faint. If this happens, do not drive. Call your local emergency services (911 in [place] if no other help is available.  You have chest pain.  You feel nauseous or vomit.  You have severe or increased shortness of breath with activity.  You feel weak.  You have a rapid heartbeat.  You have unexplained sweating.  You become light-headed when getting up from a chair or bed. MAKE SURE YOU:   Understand these instructions.  Will watch your condition.  Will get help right away if you are not doing well or get worse. Document Released: 04/19/2000 Document Revised: 04/27/2013 Document Reviewed: 12/28/2012 [facility] Patient Information 2015 [place], [place]. This information is not intended to replace advice given to you by your health care provider. Make sure you discuss any questions you have with your health care provider. Varenicline oral tablets What is this  medicine? VARENICLINE (var EN i kleen) is used to help people quit smoking. It can reduce the symptoms caused by stopping smoking. It is used with a patient support program recommended by your physician. This medicine may be used for other purposes; ask your health care provider or pharmacist if you have questions. COMMON BRAND NAME(S): Chantix What should I tell my health care provider before I take this medicine? They need to know if you have any of these conditions: -bipolar disorder, depression, schizophrenia or other mental illness -heart disease -if you often drink alcohol -kidney disease -peripheral vascular disease -seizures -stroke -suicidal thoughts, plans, or attempt; a previous suicide attempt by you or a family member -an unusual or allergic reaction to varenicline, other medicines, foods, dyes, or preservatives -pregnant or trying to get pregnant -breast-feeding How should I use this medicine? You should set a date to stop smoking and tell your doctor. Start this medicine one week before the quit date. You can also start taking this medicine before you choose a quit date, and then pick a quit date that is between 8 and 35 days of treatment with this medicine. Stick to your plan; ask about support groups or other ways to help you remain a 'quitter'. Take this medicine by mouth after eating. Take with a full glass of water. Follow the directions on the prescription label. Take your doses at regular intervals. Do not take your medicine more often than directed. A special MedGuide will be given to you by the pharmacist with each prescription and refill. Be sure to  read this information carefully each time. Talk to your pediatrician regarding the use of this medicine in children. This medicine is not approved for use in children. Overdosage: If you think you have taken too much of this medicine contact a poison control center or emergency room at once. NOTE: This medicine is only for  you. Do not share this medicine with others. What if I miss a dose? If you miss a dose, take it as soon as you can. If it is almost time for your next dose, take only that dose. Do not take double or extra doses. What may interact with this medicine? -alcohol or any product that contains alcohol -insulin -other stop smoking aids -theophylline -warfarin This list may not describe all possible interactions. Give your health care provider a list of all the medicines, herbs, non-prescription drugs, or dietary supplements you use. Also tell them if you smoke, drink alcohol, or use illegal drugs. Some items may interact with your medicine. What should I watch for while using this medicine? Visit your doctor or health care professional for regular check ups. Ask for ongoing advice and encouragement from your doctor or healthcare professional, friends, and family to help you quit. If you smoke while on this medication, quit again Your mouth may get dry. Chewing sugarless gum or sucking hard candy, and drinking plenty of water may help. Contact your doctor if the problem does not go away or is severe. You may get drowsy or dizzy. Do not drive, use machinery, or do anything that needs mental alertness until you know how this medicine affects you. Do not stand or sit up quickly, especially if you are an older patient. This reduces the risk of dizzy or fainting spells. The use of this medicine may increase the chance of suicidal thoughts or actions. Pay special attention to how you are responding while on this medicine. Any worsening of mood, or thoughts of suicide or dying should be reported to your health care professional right away. What side effects may I notice from receiving this medicine? Side effects that you should report to your doctor or health care professional as soon as possible: -allergic reactions like skin rash, itching or hives, swelling of the face, lips, tongue, or throat -breathing  problems -changes in vision -chest pain or chest tightness -confusion, trouble speaking or understanding -fast, irregular heartbeat -feeling faint or lightheaded, falls -fever -pain in legs when walking -problems with balance, talking, walking -redness, blistering, peeling or loosening of the skin, including inside the mouth -ringing in ears -seizures -sudden numbness or weakness of the face, arm or leg -suicidal thoughts or other mood changes -trouble passing urine or change in the amount of urine -unusual bleeding or bruising -unusually weak or tired Side effects that usually do not require medical attention (report to your doctor or health care professional if they continue or are bothersome): -constipation -headache -nausea, vomiting -strange dreams -stomach gas -trouble sleeping This list may not describe all possible side effects. Call your doctor for medical advice about side effects. You may report side effects to FDA at 1-800-FDA-1088. Where should I keep my medicine? Keep out of the reach of children. Store at room temperature between 15 and 30 degrees C (59 and 86 degrees F). Throw away any unused medicine after the expiration date. NOTE: This sheet is a summary. It may not cover all possible information. If you have questions about this medicine, talk to your doctor, pharmacist, or health care provider.  2015, [employer]. (2013-02-01 13:37:47) Smoking Cessation Quitting smoking is important to your health and has many advantages. However, it is not always easy to quit since nicotine is a very addictive drug. Oftentimes, people try 3 times or more before being able to quit. This document explains the best ways for you to prepare to quit smoking. Quitting takes hard work and a lot of effort, but you can do it. ADVANTAGES OF QUITTING SMOKING  You will live longer, feel better, and live better.  Your body will feel the impact of quitting smoking almost  immediately.  Within 20 minutes, blood pressure decreases. Your pulse returns to its normal level.  After 8 hours, carbon monoxide levels in the blood return to normal. Your oxygen level increases.  After 24 hours, the chance of having a heart attack starts to decrease. Your breath, hair, and body stop smelling like smoke.  After 48 hours, damaged nerve endings begin to recover. Your sense of taste and smell improve.  After 72 hours, the body is virtually free of nicotine. Your bronchial tubes relax and breathing becomes easier.  After 2 to 12 weeks, lungs can hold more air. Exercise becomes easier and circulation improves.  The risk of having a heart attack, stroke, cancer, or lung disease is greatly reduced.  After 1 year, the risk of coronary heart disease is cut in half.  After 5 years, the risk of stroke falls to the same as a nonsmoker.  After 10 years, the risk of lung cancer is cut in half and the risk of other cancers decreases significantly.  After 15 years, the risk of coronary heart disease drops, usually to the level of a nonsmoker.  If you are pregnant, quitting smoking will improve your chances of having a healthy baby.  The people you live with, especially any children, will be healthier.  You will have extra money to spend on things other than cigarettes. QUESTIONS TO THINK ABOUT BEFORE ATTEMPTING TO QUIT You may want to talk about your answers with your health care provider.  Why do you want to quit?  If you tried to quit in the past, what helped and what did not?  What will be the most difficult situations for you after you quit? How will you plan to handle them?  Who can help you through the tough times? Your family? Friends? A health care provider?  What pleasures do you get from smoking? What ways can you still get pleasure if you quit? Here are some questions to ask your health care provider:  How can you help me to be successful at quitting?  What  medicine do you think would be best for me and how should I take it?  What should I do if I need more help?  What is smoking withdrawal like? How can I get information on withdrawal? GET READY  Set a quit date.  Change your environment by getting rid of all cigarettes, ashtrays, matches, and lighters in your home, car, or work. Do not let people smoke in your home.  Review your past attempts to quit. Think about what worked and what did not. GET SUPPORT AND ENCOURAGEMENT You have a better chance of being successful if you have help. You can get support in many ways.  Tell your family, friends, and coworkers that you are going to quit and need their support. Ask them not to smoke around you.  Get individual, group, or telephone counseling and support. Programs are available at local hospitals  and health centers. Call your local health department for information about programs in your area.  Spiritual beliefs and practices may help some smokers quit.  Download a "quit meter" on your computer to keep track of quit statistics, such as how long you have gone without smoking, cigarettes not smoked, and money saved.  Get a self-help book about quitting smoking and staying off tobacco. [place] yourself from urges to smoke. Talk to someone, go for a walk, or occupy your time with a task.  Change your normal routine. Take a different route to work. Drink tea instead of coffee. Eat breakfast in a different place.  Reduce your stress. Take a hot bath, exercise, or read a book.  Plan something enjoyable to do every day. Reward yourself for not smoking.  Explore interactive web-based programs that specialize in helping you quit. GET MEDICINE AND USE IT CORRECTLY Medicines can help you stop smoking and decrease the urge to smoke. Combining medicine with the above behavioral methods and support can greatly increase your chances of successfully quitting  smoking.  Nicotine replacement therapy helps deliver nicotine to your body without the negative effects and risks of smoking. Nicotine replacement therapy includes nicotine gum, lozenges, inhalers, nasal sprays, and skin patches. Some may be available over-the-counter and others require a prescription.  Antidepressant medicine helps people abstain from smoking, but how this works is unknown. This medicine is available by prescription.  Nicotinic receptor partial agonist medicine simulates the effect of nicotine in your brain. This medicine is available by prescription. Ask your health care provider for advice about which medicines to use and how to use them based on your health history. Your health care provider will tell you what side effects to look out for if you choose to be on a medicine or therapy. Carefully read the information on the package. Do not use any other product containing nicotine while using a nicotine replacement product.  RELAPSE OR DIFFICULT SITUATIONS Most relapses occur within the first 3 months after quitting. Do not be discouraged if you start smoking again. Remember, most people try several times before finally quitting. You may have symptoms of withdrawal because your body is used to nicotine. You may crave cigarettes, be irritable, feel very hungry, cough often, get headaches, or have difficulty concentrating. The withdrawal symptoms are only temporary. They are strongest when you first quit, but they will go away within 10-14 days. To reduce the chances of relapse, try to:  Avoid drinking alcohol. Drinking lowers your chances of successfully quitting.  Reduce the amount of caffeine you consume. Once you quit smoking, the amount of caffeine in your body increases and can give you symptoms, such as a rapid heartbeat, sweating, and anxiety.  Avoid smokers because they can make you want to smoke.  Do not let weight gain distract you. Many smokers will gain weight when they  quit, usually less than 10 pounds. Eat a healthy diet and stay active. You can always lose the weight gained after you quit.  Find ways to improve your mood other than smoking. FOR MORE INFORMATION  www.smokefree.gov  Document Released: 04/16/2001 Document Revised: 09/06/2013 Document Reviewed: 08/01/2011 [facility] Patient Information 2015 [place], [place]. This information is not intended to replace advice given to you by your health care provider. Make sure you discuss any questions you have with your health care provider.

## 2014-09-13 ENCOUNTER — Encounter (HOSPITAL_BASED_OUTPATIENT_CLINIC_OR_DEPARTMENT_OTHER): Payer: BLUE CROSS/BLUE SHIELD

## 2014-09-13 VITALS — BP 108/67 | HR 73 | Temp 98.8°F | Resp 20

## 2014-09-13 DIAGNOSIS — D509 Iron deficiency anemia, unspecified: Secondary | ICD-10-CM

## 2014-09-13 MED ORDER — SODIUM CHLORIDE 0.9 % IV SOLN
125.0000 mg | Freq: Once | INTRAVENOUS | Status: AC
  Administered 2014-09-13: 125 mg via INTRAVENOUS
  Filled 2014-09-13: qty 10

## 2014-09-13 MED ORDER — SODIUM CHLORIDE 0.9 % IV SOLN
INTRAVENOUS | Status: DC
  Administered 2014-09-13: 14:00:00 via INTRAVENOUS

## 2014-09-13 MED ORDER — SODIUM CHLORIDE 0.9 % IJ SOLN
10.0000 mL | Freq: Once | INTRAMUSCULAR | Status: AC
  Administered 2014-09-13: 10 mL via INTRAVENOUS

## 2014-09-13 NOTE — Progress Notes (Signed)
Tolerated iron infusion well. 

## 2014-09-13 NOTE — Patient Instructions (Signed)
[place] [facility]  RECOMMENDATIONS MADE BY THE CONSULTANT AND ANY TEST RESULTS WILL BE SENT TO YOUR REFERRING PHYSICIAN.  Ferric gluconate 125 mg infusion today as ordered., Return as scheduled.  Thank you for choosing [place] at [facility] to provide your oncology and hematology care.  To afford each patient quality time with our provider, please arrive at least 15 minutes before your scheduled appointment time.    You need to re-schedule your appointment should you arrive 10 or more minutes late.  We strive to give you quality time with our providers, and arriving late affects you and other patients whose appointments are after yours.  Also, if you no show three or more times for appointments you may be dismissed from the clinic at the providers discretion.     Again, thank you for choosing [facility].  Our hope is that these requests will decrease the amount of time that you wait before being seen by our physicians.       _____________________________________________________________  Should you have questions after your visit to [facility], please contact our office at [phone] between the hours of 8:30 a.m. and 4:30 p.m.  Voicemails left after 4:30 p.m. will not be returned until the following business day.  For prescription refill requests, have your pharmacy contact our office.

## 2014-10-12 ENCOUNTER — Encounter (INDEPENDENT_AMBULATORY_CARE_PROVIDER_SITE_OTHER): Payer: Self-pay

## 2014-10-12 ENCOUNTER — Ambulatory Visit (INDEPENDENT_AMBULATORY_CARE_PROVIDER_SITE_OTHER): Payer: BLUE CROSS/BLUE SHIELD | Admitting: Family

## 2014-10-12 ENCOUNTER — Encounter: Payer: Self-pay | Admitting: Family

## 2014-10-12 VITALS — BP 137/86 | HR 74 | Temp 98.0°F | Ht 62.0 in | Wt 250.0 lb

## 2014-10-12 DIAGNOSIS — A084 Viral intestinal infection, unspecified: Secondary | ICD-10-CM | POA: Diagnosis not present

## 2014-10-12 MED ORDER — PROMETHAZINE HCL 12.5 MG PO TABS: 12.5000 mg | ORAL_TABLET | Freq: Three times a day (TID) | ORAL | Status: DC | PRN

## 2014-10-12 NOTE — Patient Instructions (Signed)

## 2014-10-12 NOTE — Progress Notes (Signed)
   Subjective:    Patient ID: Hayley Crawford, female    DOB: 04-13-1966, 49 y.o.   MRN: [id number]  Emesis  This is a new problem. The current episode started in the past 7 days (Sunday). The problem occurs less than 2 times per day. The problem has been gradually improving. There has been no fever. Associated symptoms include abdominal pain, chills, diarrhea, dizziness, headaches and myalgias. Pertinent negatives include no coughing or fever. She has tried bed rest and increased fluids (pherngan) for the symptoms. The treatment provided mild relief.  Diarrhea  This is a new problem. The current episode started in the past 7 days (Monday). The problem occurs 2 to 4 times per day. The problem has been unchanged. The patient states that diarrhea awakens her from sleep. Associated symptoms include abdominal pain, chills, headaches, myalgias and vomiting. Pertinent negatives include no coughing or fever. The treatment provided no relief.    Review of Systems  Constitutional: Positive for chills. Negative for fever.  Eyes: Negative.   Respiratory: Negative.  Negative for cough and shortness of breath.   Cardiovascular: Negative.  Negative for palpitations.  Gastrointestinal: Positive for vomiting, abdominal pain and diarrhea.  Endocrine: Negative.   Genitourinary: Negative.   Musculoskeletal: Positive for myalgias.  Neurological: Positive for dizziness and headaches.  Hematological: Negative.   Psychiatric/Behavioral: Negative.   All other systems reviewed and are negative.      Objective:   Physical Exam  Constitutional: She is oriented to person, place, and time. She appears well-developed and well-nourished. No distress.  HENT:  Head: Normocephalic and atraumatic.  Right Ear: External ear normal.  Left Ear: External ear normal.  Nose: Nose normal.  Mouth/Throat: Oropharynx is clear and moist.  Eyes: Pupils are equal, round, and reactive to light.  Neck: Normal range of motion. Neck  supple. No thyromegaly present.  Cardiovascular: Normal rate, regular rhythm, normal heart sounds and intact distal pulses.   No murmur heard. Pulmonary/Chest: Effort normal and breath sounds normal. No respiratory distress. She has no wheezes.  Abdominal: Soft. Bowel sounds are normal. She exhibits no distension. There is no tenderness.  Musculoskeletal: Normal range of motion. She exhibits no edema or tenderness.  Neurological: She is alert and oriented to person, place, and time. She has normal reflexes. No cranial nerve deficit.  Skin: Skin is warm and dry.  Psychiatric: She has a normal mood and affect. Her behavior is normal. Judgment and thought content normal.  Vitals reviewed.     BP 137/86 mmHg  Pulse 74  Temp(Src) 98 F (36.7 C) (Oral)  Ht 5\' 2"  (1.575 m)  Wt 250 lb (113.399 kg)  BMI 45.71 kg/m2     Assessment & Plan:  1. Viral gastroenteritis -Force fluids -Tylenol prn for fever and pain -Imodium as needed for diarrhea -Bland diet -RTO prn - promethazine (PHENERGAN) 12.5 MG tablet; Take 1 tablet (12.5 mg total) by mouth every 8 (eight) hours as needed for nausea or vomiting.  Dispense: 20 tablet; Refill: 0  Hayley Dun, FNP

## 2014-10-18 ENCOUNTER — Encounter (HOSPITAL_COMMUNITY): Payer: BLUE CROSS/BLUE SHIELD | Attending: Oncology

## 2014-10-18 DIAGNOSIS — D509 Iron deficiency anemia, unspecified: Secondary | ICD-10-CM

## 2014-10-18 LAB — CBC WITH DIFFERENTIAL/PLATELET
BASOS ABS: 0 10*3/uL (ref 0.0–0.1)
Basophils Relative: 0 % (ref 0–1)
EOS ABS: 0.2 10*3/uL (ref 0.0–0.7)
Eosinophils Relative: 3 % (ref 0–5)
HCT: 39.6 % (ref 36.0–46.0)
HEMOGLOBIN: 13.4 g/dL (ref 12.0–15.0)
Lymphocytes Relative: 24 % (ref 12–46)
Lymphs Abs: 1.9 10*3/uL (ref 0.7–4.0)
MCH: 32.4 pg (ref 26.0–34.0)
MCHC: 33.8 g/dL (ref 30.0–36.0)
MCV: 95.9 fL (ref 78.0–100.0)
Monocytes Absolute: 0.5 10*3/uL (ref 0.1–1.0)
Monocytes Relative: 6 % (ref 3–12)
NEUTROS PCT: 67 % (ref 43–77)
Neutro Abs: 5.4 10*3/uL (ref 1.7–7.7)
PLATELETS: 328 10*3/uL (ref 150–400)
RBC: 4.13 MIL/uL (ref 3.87–5.11)
RDW: 14.9 % (ref 11.5–15.5)
WBC: 8.1 10*3/uL (ref 4.0–10.5)

## 2014-10-18 LAB — IRON AND TIBC
Iron: 88 ug/dL (ref 28–170)
SATURATION RATIOS: 25 % (ref 10.4–31.8)
TIBC: 358 ug/dL (ref 250–450)
UIBC: 270 ug/dL

## 2014-10-18 LAB — FERRITIN: FERRITIN: 62 ng/mL (ref 11–307)

## 2014-10-18 NOTE — Progress Notes (Signed)
LABS DRAWN

## 2014-10-24 ENCOUNTER — Other Ambulatory Visit (HOSPITAL_COMMUNITY): Payer: Self-pay | Admitting: Oncology

## 2014-10-24 ENCOUNTER — Telehealth (HOSPITAL_COMMUNITY): Payer: Self-pay | Admitting: Hematology & Oncology

## 2014-10-24 DIAGNOSIS — D509 Iron deficiency anemia, unspecified: Secondary | ICD-10-CM

## 2014-10-24 NOTE — Telephone Encounter (Signed)
PC TO [employer] [place] W. ?ED IF [place]. PER CSR NO IT DOES NOT CALL REF# [id number]

## 2014-10-28 ENCOUNTER — Ambulatory Visit (HOSPITAL_COMMUNITY): Payer: BLUE CROSS/BLUE SHIELD

## 2014-10-28 ENCOUNTER — Encounter (HOSPITAL_BASED_OUTPATIENT_CLINIC_OR_DEPARTMENT_OTHER): Payer: BLUE CROSS/BLUE SHIELD

## 2014-10-28 VITALS — BP 115/73 | HR 84 | Temp 98.3°F | Resp 20

## 2014-10-28 DIAGNOSIS — D509 Iron deficiency anemia, unspecified: Secondary | ICD-10-CM | POA: Diagnosis not present

## 2014-10-28 MED ORDER — SODIUM CHLORIDE 0.9 % IV SOLN
Freq: Once | INTRAVENOUS | Status: AC
Start: 2014-10-28 — End: 2014-10-28
  Administered 2014-10-28: 14:00:00 via INTRAVENOUS

## 2014-10-28 MED ORDER — SODIUM CHLORIDE 0.9 % IV SOLN
125.0000 mg | Freq: Once | INTRAVENOUS | Status: AC
  Administered 2014-10-28: 125 mg via INTRAVENOUS
  Filled 2014-10-28: qty 10

## 2014-10-28 MED ORDER — SODIUM CHLORIDE 0.9 % IJ SOLN: 10.0000 mL | Freq: Once | INTRAMUSCULAR | Status: DC

## 2014-10-28 NOTE — Progress Notes (Signed)
..  Hayley Crawford presented today for iron infusion and tolerated well.

## 2014-11-11 ENCOUNTER — Encounter: Payer: Self-pay | Admitting: *Deleted

## 2014-11-29 ENCOUNTER — Encounter (HOSPITAL_COMMUNITY): Payer: Self-pay | Admitting: Oncology

## 2014-11-29 ENCOUNTER — Encounter (HOSPITAL_COMMUNITY): Payer: BLUE CROSS/BLUE SHIELD | Attending: Hematology & Oncology

## 2014-11-29 ENCOUNTER — Encounter (HOSPITAL_COMMUNITY): Payer: BLUE CROSS/BLUE SHIELD | Attending: Oncology | Admitting: Oncology

## 2014-11-29 VITALS — BP 104/77 | HR 86 | Temp 98.6°F | Resp 20 | Wt 248.0 lb

## 2014-11-29 DIAGNOSIS — D500 Iron deficiency anemia secondary to blood loss (chronic): Secondary | ICD-10-CM

## 2014-11-29 DIAGNOSIS — D509 Iron deficiency anemia, unspecified: Secondary | ICD-10-CM | POA: Insufficient documentation

## 2014-11-29 LAB — CBC WITH DIFFERENTIAL/PLATELET
BASOS ABS: 0 10*3/uL (ref 0.0–0.1)
Basophils Relative: 0 % (ref 0–1)
EOS ABS: 0.2 10*3/uL (ref 0.0–0.7)
Eosinophils Relative: 2 % (ref 0–5)
HCT: 39.3 % (ref 36.0–46.0)
HEMOGLOBIN: 13.6 g/dL (ref 12.0–15.0)
LYMPHS ABS: 2 10*3/uL (ref 0.7–4.0)
Lymphocytes Relative: 23 % (ref 12–46)
MCH: 33.5 pg (ref 26.0–34.0)
MCHC: 34.6 g/dL (ref 30.0–36.0)
MCV: 96.8 fL (ref 78.0–100.0)
MONO ABS: 0.6 10*3/uL (ref 0.1–1.0)
Monocytes Relative: 7 % (ref 3–12)
NEUTROS ABS: 6 10*3/uL (ref 1.7–7.7)
Neutrophils Relative %: 68 % (ref 43–77)
PLATELETS: 383 10*3/uL (ref 150–400)
RBC: 4.06 MIL/uL (ref 3.87–5.11)
RDW: 12.6 % (ref 11.5–15.5)
WBC: 8.9 10*3/uL (ref 4.0–10.5)

## 2014-11-29 LAB — IRON AND TIBC
Iron: 55 ug/dL (ref 28–170)
Saturation Ratios: 15 % (ref 10.4–31.8)
TIBC: 375 ug/dL (ref 250–450)
UIBC: 320 ug/dL

## 2014-11-29 LAB — FERRITIN: Ferritin: 59 ng/mL (ref 11–307)

## 2014-11-29 NOTE — Assessment & Plan Note (Signed)
Secondary to heavy menstrual cycles with intolerance to PO iron replacement; requiring IV iron replacement.    Labs today: CBC diff, iron/TIBC, ferritin.  Labs in 8 weeks and 16 weeks: CBC diff, iron/TIBC, ferritin.  Return in 16 weeks for follow-up.

## 2014-11-29 NOTE — Progress Notes (Signed)
Hayley Crawford, Hayley Hawthorne, FNP [address] [place] [address]  Iron deficiency anemia - Plan: CBC with Differential, Iron and TIBC, Ferritin  CURRENT THERAPY:IV iron replacement  INTERVAL HISTORY: Hayley Crawford 49 y.o. female returns for followup of iron deficiency anemia secondary to heavy menstrual cycles with intolerance to PO iron replacement; requiring IV iron replacement.  I personally reviewed and went over laboratory results with the patient.  The results are noted within this dictation.  Labs from today are pending.   She notes an improvement since her last iron infusion, but she feels fatigued again.  She tolerated the infusion well.    She started her menstrual cycle on Tuesday (7/19).  It is still ongoing which is typical for her she notes.  She reports that her period starts for 2-3 days, then 1 day break, and then restarts for 4-5 days.  She admits that her flow is heavy.  She has an upcoming appointment with Gyn in [place], [place].  She is interested in having total hysterectomy and I will defer to Gyn.  She is 49 yo and therefore likely near menopause, but she notes that her grandmother had periods into her mid-sixties.   Past Medical History  Diagnosis Date  . Hypertension   . COPD (chronic obstructive pulmonary disease)   . B12 deficiency     has GAD (generalized anxiety disorder); Depression; GERD (gastroesophageal reflux disease); Hyperlipidemia; Vitamin B 12 deficiency; Hypertension; Aches; COPD (chronic obstructive pulmonary disease); Tobacco user; Vitamin D deficiency; and Iron deficiency anemia on her problem list.     is allergic to sulfa antibiotics.  Hayley Crawford had no medications administered during this visit.  Past Surgical History  Procedure Laterality Date  . Tumor removed    . Bladder stretching    . Cholecystectomy    . Tendon repair      right thumb  . Hemorroidectomy      Denies any headaches, dizziness, double vision,  fevers, chills, night sweats, nausea, vomiting, diarrhea, constipation, chest pain, heart palpitations, shortness of breath, blood in stool, black tarry stool, urinary pain, urinary burning, urinary frequency, hematuria.   PHYSICAL EXAMINATION  ECOG PERFORMANCE STATUS: 1 - Symptomatic but completely ambulatory  Filed Vitals:   11/29/14 1350  BP: 104/77  Pulse: 86  Temp: 98.6 F (37 C)  Resp: 20    GENERAL:alert, no distress, well nourished, well developed, comfortable, cooperative and smiling SKIN: skin color, texture, turgor are normal, no rashes or significant lesions HEAD: Normocephalic, No masses, lesions, tenderness or abnormalities EYES: normal, PERRLA, EOMI, Conjunctiva are pink and non-injected EARS: External ears normal OROPHARYNX:lips, buccal mucosa, and tongue normal and mucous membranes are moist  NECK: supple, no adenopathy, thyroid normal size, non-tender, without nodularity, trachea midline LYMPH:  not examined BREAST:not examined LUNGS: clear to auscultation  HEART: regular rate & rhythm, no murmurs and no gallops ABDOMEN:abdomen soft, non-tender and normal bowel sounds, obese BACK: Back symmetric, no curvature. EXTREMITIES:less then 2 second capillary refill, no joint deformities, effusion, or inflammation, no skin discoloration, no clubbing, no cyanosis  NEURO: alert & oriented x 3 with fluent speech, no focal motor/sensory deficits, gait normal   LABORATORY DATA: CBC    Component Value Date/Time   WBC 8.1 10/18/2014 1032   WBC 9.1 06/06/2014 1134   WBC 10.4* 04/09/2013 1652   RBC 4.13 10/18/2014 1032   RBC 3.93 06/06/2014 1134   RBC 4.2 04/09/2013 1652  HGB 13.4 10/18/2014 1032   HGB 9.4* 04/09/2013 1652   HCT 39.6 10/18/2014 1032   HCT 31.3* 04/09/2013 1652   PLT 328 10/18/2014 1032   MCV 95.9 10/18/2014 1032   MCV 75.1* 04/09/2013 1652   MCH 32.4 10/18/2014 1032   MCH 22.6* 06/06/2014 1134   MCH 22.5* 04/09/2013 1652   MCHC 33.8 10/18/2014  1032   MCHC 29.5* 06/06/2014 1134   MCHC 30.0* 04/09/2013 1652   RDW 14.9 10/18/2014 1032   RDW 18.2* 06/06/2014 1134   LYMPHSABS 1.9 10/18/2014 1032   LYMPHSABS 2.1 06/06/2014 1134   MONOABS 0.5 10/18/2014 1032   EOSABS 0.2 10/18/2014 1032   EOSABS 0.3 06/06/2014 1134   BASOSABS 0.0 10/18/2014 1032   BASOSABS 0.1 06/06/2014 1134      Chemistry      Component Value Date/Time   NA 139 06/06/2014 1134   NA 134* 02/09/2011 0224   K 4.5 06/06/2014 1134   CL 103 06/06/2014 1134   CO2 23 06/06/2014 1134   BUN 10 06/06/2014 1134   BUN 9 02/09/2011 0224   CREATININE 0.71 06/06/2014 1134      Component Value Date/Time   CALCIUM 9.2 06/06/2014 1134   ALKPHOS 89 06/06/2014 1134   AST 21 06/06/2014 1134   ALT 24 06/06/2014 1134   BILITOT 0.2 06/06/2014 1134        Lab Results  Component Value Date   IRON 88 10/18/2014   TIBC 358 10/18/2014   FERRITIN 62 10/18/2014    ASSESSMENT AND PLAN:  Iron deficiency anemia Secondary to heavy menstrual cycles with intolerance to PO iron replacement; requiring IV iron replacement.    Labs today: CBC diff, iron/TIBC, ferritin.  Labs in 8 weeks and 16 weeks: CBC diff, iron/TIBC, ferritin.  Return in 16 weeks for follow-up.    THERAPY PLAN:  We will continue to monitor iron studies.  All questions were answered. The patient knows to call the clinic with any problems, questions or concerns. We can certainly see the patient much sooner if necessary.  Patient and plan discussed with Dr. Ancil Linsey and she is in agreement with the aforementioned.   This note is electronically signed by: Hayley Crawford 11/29/2014 3:57 PM

## 2014-11-29 NOTE — Progress Notes (Signed)
..  Hayley Crawford's reason for visit today are for labs as scheduled per MD orders.  Venipuncture performed with a 23 gauge butterfly needle to R Antecubital.  Hayley Crawford tolerated venipuncture well and without incident; questions were answered and patient was discharged.

## 2014-11-29 NOTE — Patient Instructions (Addendum)
..  [place] at [facility] Discharge Instructions  RECOMMENDATIONS MADE BY THE CONSULTANT AND ANY TEST RESULTS WILL BE SENT TO YOUR REFERRING PHYSICIAN.  Exam per T. Kefalas PA-C Labs in 8 weeks and 16 weeks  Return in 4 months  Thank you for choosing [place] at [facility] to provide your oncology and hematology care.  To afford each patient quality time with our provider, please arrive at least 15 minutes before your scheduled appointment time.    You need to re-schedule your appointment should you arrive 10 or more minutes late.  We strive to give you quality time with our providers, and arriving late affects you and other patients whose appointments are after yours.  Also, if you no show three or more times for appointments you may be dismissed from the clinic at the providers discretion.     Again, thank you for choosing [facility].  Our hope is that these requests will decrease the amount of time that you wait before being seen by our physicians.       _____________________________________________________________  Should you have questions after your visit to [facility], please contact our office at [phone] between the hours of 8:30 a.m. and 4:30 p.m.  Voicemails left after 4:30 p.m. will not be returned until the following business day.  For prescription refill requests, have your pharmacy contact our office.

## 2014-11-30 ENCOUNTER — Other Ambulatory Visit (HOSPITAL_COMMUNITY): Payer: Self-pay | Admitting: Oncology

## 2014-11-30 DIAGNOSIS — D509 Iron deficiency anemia, unspecified: Secondary | ICD-10-CM

## 2014-12-09 ENCOUNTER — Telehealth: Payer: Self-pay | Admitting: Family

## 2014-12-12 ENCOUNTER — Telehealth: Payer: Self-pay | Admitting: Family

## 2014-12-12 ENCOUNTER — Encounter (HOSPITAL_COMMUNITY): Payer: Self-pay

## 2014-12-12 ENCOUNTER — Encounter (HOSPITAL_COMMUNITY): Payer: BLUE CROSS/BLUE SHIELD | Attending: Oncology

## 2014-12-12 VITALS — BP 124/81 | HR 78 | Temp 98.5°F | Resp 20

## 2014-12-12 DIAGNOSIS — D509 Iron deficiency anemia, unspecified: Secondary | ICD-10-CM

## 2014-12-12 DIAGNOSIS — D500 Iron deficiency anemia secondary to blood loss (chronic): Secondary | ICD-10-CM

## 2014-12-12 MED ORDER — SODIUM CHLORIDE 0.9 % IV SOLN
INTRAVENOUS | Status: DC
  Administered 2014-12-12: 14:00:00 via INTRAVENOUS

## 2014-12-12 MED ORDER — SODIUM CHLORIDE 0.9 % IV SOLN
125.0000 mg | Freq: Once | INTRAVENOUS | Status: AC
  Administered 2014-12-12: 125 mg via INTRAVENOUS
  Filled 2014-12-12: qty 10

## 2014-12-12 NOTE — Progress Notes (Signed)
1545:  Tolerated infusion w/o adverse reaction.  A&Ox4.  VSS.  Discharged ambulatory.

## 2014-12-12 NOTE — Patient Instructions (Signed)
[facility] at [facility] Discharge Instructions  RECOMMENDATIONS MADE BY THE CONSULTANT AND ANY TEST RESULTS WILL BE SENT TO YOUR REFERRING PHYSICIAN.  Iron infusion today. Return as scheduled for lab work and office visit.    Thank you for choosing [facility] at [facility] to provide your oncology and hematology care.  To afford each patient quality time with our provider, please arrive at least 15 minutes before your scheduled appointment time.    You need to re-schedule your appointment should you arrive 10 or more minutes late.  We strive to give you quality time with our providers, and arriving late affects you and other patients whose appointments are after yours.  Also, if you no show three or more times for appointments you may be dismissed from the clinic at the providers discretion.     Again, thank you for choosing [facility].  Our hope is that these requests will decrease the amount of time that you wait before being seen by our physicians.       _____________________________________________________________  Should you have questions after your visit to [facility], please contact our office at [phone] between the hours of 8:30 a.m. and 4:30 p.m.  Voicemails left after 4:30 p.m. will not be returned until the following business day.  For prescription refill requests, have your pharmacy contact our office.    

## 2014-12-13 NOTE — Telephone Encounter (Signed)
In process 

## 2014-12-14 ENCOUNTER — Telehealth: Payer: Self-pay

## 2014-12-14 NOTE — Telephone Encounter (Signed)
Insurance denied prior authorization for Benicar HCT  Alternative meds are Losartan-HCTZ, Valsartan-HCTZ and Telmisartan-HCTZ

## 2014-12-15 ENCOUNTER — Telehealth: Payer: Self-pay | Admitting: Family

## 2014-12-15 MED ORDER — LOSARTAN POTASSIUM-HCTZ 100-25 MG PO TABS: 1.0000 | ORAL_TABLET | Freq: Every day | ORAL | Status: DC

## 2014-12-15 NOTE — Telephone Encounter (Signed)
Spoke with patient.

## 2014-12-15 NOTE — Telephone Encounter (Signed)
Patient aware.

## 2014-12-15 NOTE — Telephone Encounter (Signed)
Insurance denied Benicar HCTZ. New rx of losartan-HCTZ sent to pharmacy

## 2014-12-16 ENCOUNTER — Ambulatory Visit (INDEPENDENT_AMBULATORY_CARE_PROVIDER_SITE_OTHER): Payer: BLUE CROSS/BLUE SHIELD | Admitting: Family

## 2014-12-16 ENCOUNTER — Encounter: Payer: Self-pay | Admitting: Family

## 2014-12-16 VITALS — BP 138/89 | HR 73 | Temp 99.7°F | Ht 62.0 in | Wt 262.0 lb

## 2014-12-16 DIAGNOSIS — I10 Essential (primary) hypertension: Secondary | ICD-10-CM

## 2014-12-16 DIAGNOSIS — E538 Deficiency of other specified B group vitamins: Secondary | ICD-10-CM | POA: Diagnosis not present

## 2014-12-16 DIAGNOSIS — K219 Gastro-esophageal reflux disease without esophagitis: Secondary | ICD-10-CM

## 2014-12-16 DIAGNOSIS — J449 Chronic obstructive pulmonary disease, unspecified: Secondary | ICD-10-CM | POA: Diagnosis not present

## 2014-12-16 DIAGNOSIS — D509 Iron deficiency anemia, unspecified: Secondary | ICD-10-CM | POA: Diagnosis not present

## 2014-12-16 DIAGNOSIS — Z720 Tobacco use: Secondary | ICD-10-CM

## 2014-12-16 DIAGNOSIS — E559 Vitamin D deficiency, unspecified: Secondary | ICD-10-CM | POA: Diagnosis not present

## 2014-12-16 DIAGNOSIS — F329 Major depressive disorder, single episode, unspecified: Secondary | ICD-10-CM

## 2014-12-16 DIAGNOSIS — F411 Generalized anxiety disorder: Secondary | ICD-10-CM | POA: Diagnosis not present

## 2014-12-16 DIAGNOSIS — F32A Depression, unspecified: Secondary | ICD-10-CM

## 2014-12-16 DIAGNOSIS — E785 Hyperlipidemia, unspecified: Secondary | ICD-10-CM | POA: Diagnosis not present

## 2014-12-16 NOTE — Patient Instructions (Signed)

## 2014-12-16 NOTE — Progress Notes (Signed)
Subjective:    Patient ID: Hayley Crawford, female    DOB: 1965-05-27, 49 y.o.   MRN: [id number]  Pt presents to the office today for chronic follow up. Pt has iron deficiency anemia and is getting Iron infusions every 2 weeks.  Hypertension This is a chronic problem. The current episode started more than 1 year ago. The problem has been resolved since onset. The problem is controlled. Associated symptoms include anxiety, malaise/fatigue and palpitations ("if i get really upset"). Pertinent negatives include no chest pain, headaches or shortness of breath. Peripheral edema: "trace amount in ankles" Risk factors for coronary artery disease include dyslipidemia, obesity, post-menopausal state, family history and smoking/tobacco exposure. Past treatments include diuretics and angiotensin blockers. The current treatment provides significant improvement. There is no history of kidney disease, CAD/MI, CVA, heart failure or a thyroid problem. Identifiable causes of hypertension include sleep apnea.  Hyperlipidemia This is a chronic problem. The current episode started more than 1 year ago. The problem is uncontrolled. Recent lipid tests were reviewed and are high. Exacerbating diseases include obesity. She has no history of diabetes or hypothyroidism. Factors aggravating her hyperlipidemia include smoking. Pertinent negatives include no chest pain or shortness of breath. She is currently on no antihyperlipidemic treatment. The current treatment provides no improvement of lipids. Compliance problems include adherence to diet.  Risk factors for coronary artery disease include dyslipidemia, family history, hypertension and a sedentary lifestyle.  Gastrophageal Reflux She reports no chest pain, no coughing, no heartburn, no sore throat or no wheezing. This is a chronic problem. The current episode started more than 1 year ago. The problem occurs rarely. The symptoms are aggravated by certain foods. Pertinent negatives  include no fatigue or muscle weakness. She has tried a PPI for the symptoms. The treatment provided significant relief.  Anxiety Presents for follow-up visit. Onset was 1 to 6 months ago. Symptoms include excessive worry, nervous/anxious behavior and palpitations ("if i get really upset"). Patient reports no chest pain, decreased concentration, depressed mood, insomnia, panic or shortness of breath. Symptoms occur occasionally.   Her past medical history is significant for anemia and anxiety/panic attacks. There is no history of depression.  Anemia Presents for follow-up visit. Symptoms include malaise/fatigue and palpitations ("if i get really upset"). There has been no bruising/bleeding easily. Past treatments include changes in diet and parenteral iron supplements. There is no history of heart failure or hypothyroidism. Family history includes iron deficiency.      Review of Systems  Constitutional: Positive for malaise/fatigue. Negative for fatigue.  HENT: Negative.  Negative for sore throat.   Eyes: Negative.   Respiratory: Negative.  Negative for cough, shortness of breath and wheezing.   Cardiovascular: Positive for palpitations ("if i get really upset"). Negative for chest pain.  Gastrointestinal: Negative.  Negative for heartburn.  Endocrine: Negative.   Genitourinary: Negative.   Musculoskeletal: Negative.  Negative for muscle weakness.  Neurological: Negative.  Negative for headaches.  Hematological: Negative.  Does not bruise/bleed easily.  Psychiatric/Behavioral: Negative for decreased concentration. The patient is nervous/anxious. The patient does not have insomnia.   All other systems reviewed and are negative.      Objective:   Physical Exam  Constitutional: She is oriented to person, place, and time. She appears well-developed and well-nourished. No distress.  HENT:  Head: Normocephalic and atraumatic.  Right Ear: External ear normal.  Left Ear: External ear normal.   Nose: Nose normal.  Mouth/Throat: Oropharynx is clear and moist.  Eyes: Pupils  are equal, round, and reactive to light.  Neck: Normal range of motion. Neck supple. No thyromegaly present.  Cardiovascular: Normal rate, regular rhythm, normal heart sounds and intact distal pulses.   No murmur heard. Pulmonary/Chest: Effort normal and breath sounds normal. No respiratory distress. She has no wheezes.  Abdominal: Soft. Bowel sounds are normal. She exhibits no distension. There is no tenderness.  Musculoskeletal: Normal range of motion. She exhibits edema (2+ BLE). She exhibits no tenderness.  Neurological: She is alert and oriented to person, place, and time. She has normal reflexes. No cranial nerve deficit.  Skin: Skin is warm and dry.  Psychiatric: She has a normal mood and affect. Her behavior is normal. Judgment and thought content normal.  Vitals reviewed.     BP 138/89 mmHg  Pulse 73  Temp(Src) 99.7 F (37.6 C) (Oral)  Ht '5\' 2"'  (1.575 m)  Wt 262 lb (118.842 kg)  BMI 47.91 kg/m2  LMP  (Approximate)     Assessment & Plan:  1. Essential hypertension - CMP14+EGFR  2. Chronic obstructive pulmonary disease, unspecified COPD, unspecified chronic bronchitis type - CMP14+EGFR  3. Gastroesophageal reflux disease, esophagitis presence not specified - CMP14+EGFR  4. Vitamin B 12 deficienc - CMP14+EGFR  5. GAD (generalized anxiety disorder) - CMP14+EGFR  6. Depression - CMP14+EGFR  7. Hyperlipidemia - CMP14+EGFR - Lipid panel  8. Vitamin D deficiency - CMP14+EGFR - Vit D  25 hydroxy (rtn osteoporosis monitoring)  9. Iron deficiency anemia - CMP14+EGFR  10. Tobacco user - CMP14+EGFR   Continue all meds Labs pending Health Maintenance reviewed Diet and exercise encouraged RTO 3 months  [name], FNP

## 2014-12-17 ENCOUNTER — Other Ambulatory Visit: Payer: Self-pay | Admitting: Family

## 2014-12-17 LAB — CMP14+EGFR
A/G RATIO: 1.5 (ref 1.1–2.5)
ALK PHOS: 77 IU/L (ref 39–117)
ALT: 32 IU/L (ref 0–32)
AST: 19 IU/L (ref 0–40)
Albumin: 4 g/dL (ref 3.5–5.5)
BUN / CREAT RATIO: 12 (ref 9–23)
BUN: 8 mg/dL (ref 6–24)
Bilirubin Total: 0.2 mg/dL (ref 0.0–1.2)
CO2: 23 mmol/L (ref 18–29)
Calcium: 9.1 mg/dL (ref 8.7–10.2)
Chloride: 99 mmol/L (ref 97–108)
Creatinine, Ser: 0.68 mg/dL (ref 0.57–1.00)
GFR calc Af Amer: 117 mL/min/{1.73_m2} (ref >59)
GFR calc non Af Amer: 102 mL/min/{1.73_m2} (ref >59)
GLOBULIN, TOTAL: 2.6 g/dL (ref 1.5–4.5)
GLUCOSE: 79 mg/dL (ref 65–99)
POTASSIUM: 4 mmol/L (ref 3.5–5.2)
SODIUM: 137 mmol/L (ref 134–144)
TOTAL PROTEIN: 6.6 g/dL (ref 6.0–8.5)

## 2014-12-17 LAB — LIPID PANEL
CHOL/HDL RATIO: 5.2 ratio — AB (ref 0.0–4.4)
CHOLESTEROL TOTAL: 220 mg/dL — AB (ref 100–199)
HDL: 42 mg/dL (ref >39)
LDL CALC: 122 mg/dL — AB (ref 0–99)
Triglycerides: 282 mg/dL — ABNORMAL HIGH (ref 0–149)
VLDL CHOLESTEROL CAL: 56 mg/dL — AB (ref 5–40)

## 2014-12-17 LAB — VITAMIN D 25 HYDROXY (VIT D DEFICIENCY, FRACTURES): VIT D 25 HYDROXY: 16.2 ng/mL — AB (ref 30.0–100.0)

## 2014-12-17 MED ORDER — ATORVASTATIN CALCIUM 40 MG PO TABS: 40.0000 mg | ORAL_TABLET | Freq: Every day | ORAL | Status: DC

## 2014-12-19 ENCOUNTER — Telehealth: Payer: Self-pay | Admitting: Family

## 2015-01-24 ENCOUNTER — Other Ambulatory Visit (HOSPITAL_COMMUNITY): Payer: BLUE CROSS/BLUE SHIELD

## 2015-01-26 ENCOUNTER — Other Ambulatory Visit (HOSPITAL_COMMUNITY): Payer: BLUE CROSS/BLUE SHIELD

## 2015-01-30 ENCOUNTER — Encounter (HOSPITAL_COMMUNITY): Payer: BLUE CROSS/BLUE SHIELD | Attending: Hematology & Oncology

## 2015-01-30 DIAGNOSIS — D509 Iron deficiency anemia, unspecified: Secondary | ICD-10-CM | POA: Diagnosis not present

## 2015-01-30 LAB — IRON AND TIBC
IRON: 74 ug/dL (ref 28–170)
Saturation Ratios: 18 % (ref 10.4–31.8)
TIBC: 400 ug/dL (ref 250–450)
UIBC: 326 ug/dL

## 2015-01-30 LAB — CBC WITH DIFFERENTIAL/PLATELET
BASOS PCT: 0 %
Basophils Absolute: 0 10*3/uL (ref 0.0–0.1)
EOS ABS: 0.2 10*3/uL (ref 0.0–0.7)
EOS PCT: 3 %
HCT: 41.2 % (ref 36.0–46.0)
Hemoglobin: 14.1 g/dL (ref 12.0–15.0)
Lymphocytes Relative: 20 %
Lymphs Abs: 1.9 10*3/uL (ref 0.7–4.0)
MCH: 32.8 pg (ref 26.0–34.0)
MCHC: 34.2 g/dL (ref 30.0–36.0)
MCV: 95.8 fL (ref 78.0–100.0)
MONO ABS: 0.6 10*3/uL (ref 0.1–1.0)
Monocytes Relative: 6 %
Neutro Abs: 6.5 10*3/uL (ref 1.7–7.7)
Neutrophils Relative %: 71 %
Platelets: 331 10*3/uL (ref 150–400)
RBC: 4.3 MIL/uL (ref 3.87–5.11)
RDW: 12.5 % (ref 11.5–15.5)
WBC: 9.2 10*3/uL (ref 4.0–10.5)

## 2015-01-30 LAB — FERRITIN: Ferritin: 25 ng/mL (ref 11–307)

## 2015-01-30 NOTE — Progress Notes (Signed)
Lab draw

## 2015-01-31 ENCOUNTER — Other Ambulatory Visit (HOSPITAL_COMMUNITY): Payer: Self-pay | Admitting: Oncology

## 2015-01-31 DIAGNOSIS — D509 Iron deficiency anemia, unspecified: Secondary | ICD-10-CM

## 2015-02-03 ENCOUNTER — Encounter (HOSPITAL_COMMUNITY): Payer: Self-pay

## 2015-02-03 ENCOUNTER — Encounter (HOSPITAL_BASED_OUTPATIENT_CLINIC_OR_DEPARTMENT_OTHER): Payer: BLUE CROSS/BLUE SHIELD

## 2015-02-03 DIAGNOSIS — D509 Iron deficiency anemia, unspecified: Secondary | ICD-10-CM

## 2015-02-03 DIAGNOSIS — D500 Iron deficiency anemia secondary to blood loss (chronic): Secondary | ICD-10-CM | POA: Diagnosis not present

## 2015-02-03 MED ORDER — SODIUM CHLORIDE 0.9 % IV SOLN
INTRAVENOUS | Status: DC
  Administered 2015-02-03: 09:00:00 via INTRAVENOUS

## 2015-02-03 MED ORDER — NA FERRIC GLUC CPLX IN SUCROSE 12.5 MG/ML IV SOLN
125.0000 mg | Freq: Once | INTRAVENOUS | Status: AC
  Administered 2015-02-03: 125 mg via INTRAVENOUS
  Filled 2015-02-03: qty 10

## 2015-02-03 NOTE — Progress Notes (Signed)
1055:  Tolerated infusion w/o adverse reaction.  VSS.  Discharged ambulatory.

## 2015-02-03 NOTE — Patient Instructions (Signed)
[place] at [facility] Discharge Instructions  RECOMMENDATIONS MADE BY THE CONSULTANT AND ANY TEST RESULTS WILL BE SENT TO YOUR REFERRING PHYSICIAN.  Iron infusion today. Return as scheduled next week for iron infusion. Return as scheduled for labs and office visit.    Thank you for choosing [place] at [facility] to provide your oncology and hematology care.  To afford each patient quality time with our provider, please arrive at least 15 minutes before your scheduled appointment time.    You need to re-schedule your appointment should you arrive 10 or more minutes late.  We strive to give you quality time with our providers, and arriving late affects you and other patients whose appointments are after yours.  Also, if you no show three or more times for appointments you may be dismissed from the clinic at the providers discretion.     Again, thank you for choosing [facility].  Our hope is that these requests will decrease the amount of time that you wait before being seen by our physicians.       _____________________________________________________________  Should you have questions after your visit to [facility], please contact our office at [phone] between the hours of 8:30 a.m. and 4:30 p.m.  Voicemails left after 4:30 p.m. will not be returned until the following business day.  For prescription refill requests, have your pharmacy contact our office.

## 2015-02-10 ENCOUNTER — Ambulatory Visit (HOSPITAL_COMMUNITY): Payer: BLUE CROSS/BLUE SHIELD

## 2015-02-10 MED ORDER — SODIUM CHLORIDE 0.9 % IV SOLN: INTRAVENOUS | Status: DC

## 2015-02-10 MED ORDER — SODIUM CHLORIDE 0.9 % IV SOLN
125.0000 mg | Freq: Once | INTRAVENOUS | Status: DC
  Filled 2015-02-10: qty 10

## 2015-02-10 NOTE — Progress Notes (Signed)
Patient no show

## 2015-03-19 NOTE — Progress Notes (Signed)
[name], FNP [place] [id number]  Iron deficiency anemia - Plan: CBC with Differential, Iron and TIBC, Ferritin, CBC with Differential, Iron and TIBC, Ferritin  CURRENT THERAPY:IV iron replacement  Oncology Flowsheet 09/13/2014 10/28/2014 12/12/2014 02/03/2015  ferric gluconate (NULECIT) IV 125 mg 125 mg 125 mg 125 mg    INTERVAL HISTORY: Hayley Crawford 49 y.o. female returns for followup of iron deficiency anemia secondary to heavy menstrual cycles with intolerance to PO iron replacement; requiring IV iron replacement.  I personally reviewed and went over laboratory results with the patient.  The results are noted within this dictation.  Labs from today are pending.   She notes an improvement since her last iron infusion, but she feels fatigued again.  She tolerated the infusion well.    She has been followed by Dr. Earleen Newport, Gyn in [facility].  She will be undergoing partial hysterectomy on 04/12/2015 for menorrhagia.  Plan is to maintain her ovaries, the patient reports.  She continues with significant vaginal blood loss.  Otherwise, she denies any complaints.  Past Medical History  Diagnosis Date  . Hypertension   . COPD (chronic obstructive pulmonary disease) ([place])   . B12 deficiency   . Uterine fibroid     has GAD (generalized anxiety disorder); Depression; GERD (gastroesophageal reflux disease); Hyperlipidemia; Vitamin B 12 deficiency; Hypertension; Aches; COPD (chronic obstructive pulmonary disease) ([place]); Tobacco user; Vitamin D deficiency; and Iron deficiency anemia on her problem list.     is allergic to sulfa antibiotics.  Ms. Wadlington had no medications administered during this visit.  Past Surgical History  Procedure Laterality Date  . Tumor removed    . Bladder stretching    . Cholecystectomy    . Tendon repair      right thumb  . Hemorroidectomy      Denies any headaches, dizziness, double vision, fevers, chills, night  sweats, nausea, vomiting, diarrhea, constipation, chest pain, heart palpitations, shortness of breath, blood in stool, black tarry stool, urinary pain, urinary burning, urinary frequency, hematuria.   PHYSICAL EXAMINATION  ECOG PERFORMANCE STATUS: 1 - Symptomatic but completely ambulatory  Filed Vitals:   03/21/15 1304  BP: 125/72  Pulse: 83  Temp: 99.3 F (37.4 C)    GENERAL:alert, no distress, well nourished, well developed, comfortable, cooperative and smiling, obese SKIN: skin color, texture, turgor are normal, no rashes or significant lesions HEAD: Normocephalic, No masses, lesions, tenderness or abnormalities EYES: normal, PERRLA, EOMI, Conjunctiva are pink and non-injected EARS: External ears normal OROPHARYNX:lips, buccal mucosa, and tongue normal and mucous membranes are moist  NECK: supple, no adenopathy, thyroid normal size, non-tender, without nodularity, trachea midline LYMPH:  not examined BREAST:not examined LUNGS: clear to auscultation  HEART: regular rate & rhythm, no murmurs and no gallops ABDOMEN:abdomen soft, non-tender and normal bowel sounds, obese BACK: Back symmetric, no curvature. EXTREMITIES:less then 2 second capillary refill, no joint deformities, effusion, or inflammation, no skin discoloration, no clubbing, no cyanosis  NEURO: alert & oriented x 3 with fluent speech, no focal motor/sensory deficits, gait normal   LABORATORY DATA: CBC    Component Value Date/Time   WBC 9.2 01/30/2015 0955   WBC 9.1 06/06/2014 1134   WBC 10.4* 04/09/2013 1652   RBC 4.30 01/30/2015 0955   RBC 3.93 06/06/2014 1134   RBC 4.2 04/09/2013 1652   HGB 14.1 01/30/2015 0955   HGB 9.4* 04/09/2013 1652   HCT 41.2 01/30/2015 0955  HCT 31.3* 04/09/2013 1652   PLT 331 01/30/2015 0955   MCV 95.8 01/30/2015 0955   MCV 75.1* 04/09/2013 1652   MCH 32.8 01/30/2015 0955   MCH 22.6* 06/06/2014 1134   MCH 22.5* 04/09/2013 1652   MCHC 34.2 01/30/2015 0955   MCHC 29.5*  06/06/2014 1134   MCHC 30.0* 04/09/2013 1652   RDW 12.5 01/30/2015 0955   RDW 18.2* 06/06/2014 1134   LYMPHSABS 1.9 01/30/2015 0955   LYMPHSABS 2.1 06/06/2014 1134   MONOABS 0.6 01/30/2015 0955   EOSABS 0.2 01/30/2015 0955   EOSABS 0.3 06/06/2014 1134   BASOSABS 0.0 01/30/2015 0955   BASOSABS 0.1 06/06/2014 1134      Chemistry      Component Value Date/Time   NA 137 12/16/2014 1249   NA 134* 02/09/2011 0224   K 4.0 12/16/2014 1249   CL 99 12/16/2014 1249   CO2 23 12/16/2014 1249   BUN 8 12/16/2014 1249   BUN 9 02/09/2011 0224   CREATININE 0.68 12/16/2014 1249      Component Value Date/Time   CALCIUM 9.1 12/16/2014 1249   ALKPHOS 77 12/16/2014 1249   AST 19 12/16/2014 1249   ALT 32 12/16/2014 1249   BILITOT 0.2 12/16/2014 1249   BILITOT 0.2 06/06/2014 1134        Lab Results  Component Value Date   IRON 74 01/30/2015   TIBC 400 01/30/2015   FERRITIN 25 01/30/2015    ASSESSMENT AND PLAN:  Iron deficiency anemia Iron deficiency anemia, secondary to heavy menstrual cycles with intolerance to PO iron replacement; requiring IV iron replacement.    Labs today: CBC diff, iron/TIBC, ferritin.  Labs are pending at this time.  Will update the patient as results become available.  She is have a partial hysterectomy by Dr. Earleen Newport ([place]) on 04/12/2015.    As a result of upcoming surgery, we will plan labs accordingly based upon no results available to me from updated labs today:  Labs ~ 2 weeks out from surgery: CBC diff, iron/TIBC, ferritin  Labs mid-Feb 2017: CBC diff, iron/TIBC, ferritin  Return in mid-Feb 2017 for follow-up.   THERAPY PLAN:  We will continue to monitor iron studies.  All questions were answered. The patient knows to call the clinic with any problems, questions or concerns. We can certainly see the patient much sooner if necessary.  Patient and plan discussed with Dr. Ancil Linsey and she is in agreement with the  aforementioned.   This note is electronically signed by: Robynn Pane 03/21/2015 1:43 PM

## 2015-03-19 NOTE — Assessment & Plan Note (Addendum)
Iron deficiency anemia, secondary to heavy menstrual cycles with intolerance to PO iron replacement; requiring IV iron replacement.    Labs today: CBC diff, iron/TIBC, ferritin.  Labs are pending at this time.  Will update the patient as results become available.  She is have a partial hysterectomy by Dr. Earleen Newport ([place]) on 04/12/2015.    As a result of upcoming surgery, we will plan labs accordingly based upon no results available to me from updated labs today:  Labs ~ 2 weeks out from surgery: CBC diff, iron/TIBC, ferritin  Labs mid-Feb 2017: CBC diff, iron/TIBC, ferritin  Return in mid-Feb 2017 for follow-up.

## 2015-03-21 ENCOUNTER — Encounter (HOSPITAL_COMMUNITY): Payer: Self-pay | Admitting: Oncology

## 2015-03-21 ENCOUNTER — Encounter (HOSPITAL_COMMUNITY): Payer: BLUE CROSS/BLUE SHIELD | Attending: Hematology & Oncology | Admitting: Oncology

## 2015-03-21 ENCOUNTER — Encounter (HOSPITAL_BASED_OUTPATIENT_CLINIC_OR_DEPARTMENT_OTHER): Payer: BLUE CROSS/BLUE SHIELD

## 2015-03-21 VITALS — BP 125/72 | HR 83 | Temp 99.3°F | Wt 253.4 lb

## 2015-03-21 DIAGNOSIS — D509 Iron deficiency anemia, unspecified: Secondary | ICD-10-CM

## 2015-03-21 DIAGNOSIS — N920 Excessive and frequent menstruation with regular cycle: Secondary | ICD-10-CM

## 2015-03-21 LAB — CBC WITH DIFFERENTIAL/PLATELET
Basophils Absolute: 0 10*3/uL (ref 0.0–0.1)
Basophils Relative: 0 %
EOS ABS: 0.3 10*3/uL (ref 0.0–0.7)
Eosinophils Relative: 3 %
HEMATOCRIT: 39.1 % (ref 36.0–46.0)
HEMOGLOBIN: 13.2 g/dL (ref 12.0–15.0)
LYMPHS ABS: 2.2 10*3/uL (ref 0.7–4.0)
Lymphocytes Relative: 20 %
MCH: 32.2 pg (ref 26.0–34.0)
MCHC: 33.8 g/dL (ref 30.0–36.0)
MCV: 95.4 fL (ref 78.0–100.0)
MONOS PCT: 6 %
Monocytes Absolute: 0.7 10*3/uL (ref 0.1–1.0)
NEUTROS ABS: 7.8 10*3/uL — AB (ref 1.7–7.7)
NEUTROS PCT: 71 %
Platelets: 325 10*3/uL (ref 150–400)
RBC: 4.1 MIL/uL (ref 3.87–5.11)
RDW: 13.2 % (ref 11.5–15.5)
WBC: 11 10*3/uL — AB (ref 4.0–10.5)

## 2015-03-21 LAB — IRON AND TIBC
Iron: 57 ug/dL (ref 28–170)
SATURATION RATIOS: 14 % (ref 10.4–31.8)
TIBC: 402 ug/dL (ref 250–450)
UIBC: 345 ug/dL

## 2015-03-21 LAB — FERRITIN: Ferritin: 35 ng/mL (ref 11–307)

## 2015-03-21 NOTE — Patient Instructions (Signed)
[place] at [facility] Discharge Instructions  RECOMMENDATIONS MADE BY THE CONSULTANT AND ANY TEST RESULTS WILL BE SENT TO YOUR REFERRING PHYSICIAN.  Exam and discussion by Robynn Pane, PA-C Will let you know if any issues with your labs from today. Report increased fatigue or shortness of breath  Follow-up: Labs week of December 19 and Mid February Office visit in February.  Thank you for choosing [place] at [facility] to provide your oncology and hematology care.  To afford each patient quality time with our provider, please arrive at least 15 minutes before your scheduled appointment time.    You need to re-schedule your appointment should you arrive 10 or more minutes late.  We strive to give you quality time with our providers, and arriving late affects you and other patients whose appointments are after yours.  Also, if you no show three or more times for appointments you may be dismissed from the clinic at the providers discretion.     Again, thank you for choosing [facility].  Our hope is that these requests will decrease the amount of time that you wait before being seen by our physicians.       _____________________________________________________________  Should you have questions after your visit to [facility], please contact our office at [phone] between the hours of 8:30 a.m. and 4:30 p.m.  Voicemails left after 4:30 p.m. will not be returned until the following business day.  For prescription refill requests, have your pharmacy contact our office.

## 2015-03-22 ENCOUNTER — Other Ambulatory Visit (HOSPITAL_COMMUNITY): Payer: Self-pay | Admitting: Oncology

## 2015-03-22 DIAGNOSIS — D509 Iron deficiency anemia, unspecified: Secondary | ICD-10-CM

## 2015-03-23 ENCOUNTER — Encounter: Payer: Self-pay | Admitting: Family

## 2015-03-23 ENCOUNTER — Ambulatory Visit (INDEPENDENT_AMBULATORY_CARE_PROVIDER_SITE_OTHER): Payer: BLUE CROSS/BLUE SHIELD | Admitting: Family

## 2015-03-23 VITALS — BP 136/88 | HR 87 | Temp 98.8°F | Ht 62.0 in | Wt 252.8 lb

## 2015-03-23 DIAGNOSIS — F411 Generalized anxiety disorder: Secondary | ICD-10-CM

## 2015-03-23 DIAGNOSIS — E8881 Metabolic syndrome: Secondary | ICD-10-CM

## 2015-03-23 DIAGNOSIS — I10 Essential (primary) hypertension: Secondary | ICD-10-CM

## 2015-03-23 DIAGNOSIS — F329 Major depressive disorder, single episode, unspecified: Secondary | ICD-10-CM | POA: Diagnosis not present

## 2015-03-23 DIAGNOSIS — F32A Depression, unspecified: Secondary | ICD-10-CM

## 2015-03-23 DIAGNOSIS — D509 Iron deficiency anemia, unspecified: Secondary | ICD-10-CM

## 2015-03-23 DIAGNOSIS — J449 Chronic obstructive pulmonary disease, unspecified: Secondary | ICD-10-CM | POA: Diagnosis not present

## 2015-03-23 DIAGNOSIS — K219 Gastro-esophageal reflux disease without esophagitis: Secondary | ICD-10-CM

## 2015-03-23 DIAGNOSIS — Z720 Tobacco use: Secondary | ICD-10-CM | POA: Diagnosis not present

## 2015-03-23 DIAGNOSIS — E559 Vitamin D deficiency, unspecified: Secondary | ICD-10-CM

## 2015-03-23 DIAGNOSIS — E785 Hyperlipidemia, unspecified: Secondary | ICD-10-CM

## 2015-03-23 DIAGNOSIS — E538 Deficiency of other specified B group vitamins: Secondary | ICD-10-CM | POA: Diagnosis not present

## 2015-03-23 NOTE — Progress Notes (Signed)
LABS DRAWN

## 2015-03-23 NOTE — Progress Notes (Signed)
Subjective:    Patient ID: Hayley Crawford, female    DOB: [birth year], 49 y.o.   MRN: [id number]  Pt presents to the office today for chronic follow up.  Hypertension This is a chronic problem. The current episode started more than 1 year ago. The problem has been resolved since onset. The problem is controlled. Associated symptoms include anxiety, malaise/fatigue and palpitations ("if i get really upset"). Pertinent negatives include no chest pain, headaches or shortness of breath. Peripheral edema: "trace amount in ankles" Risk factors for coronary artery disease include dyslipidemia, obesity, post-menopausal state, family history and smoking/tobacco exposure. Past treatments include diuretics and angiotensin blockers. The current treatment provides significant improvement. There is no history of kidney disease, CAD/MI, CVA, heart failure or a thyroid problem. Identifiable causes of hypertension include sleep apnea.  Hyperlipidemia This is a chronic problem. The current episode started more than 1 year ago. The problem is uncontrolled. Recent lipid tests were reviewed and are high. Exacerbating diseases include obesity. She has no history of diabetes or hypothyroidism. Factors aggravating her hyperlipidemia include smoking. Pertinent negatives include no chest pain or shortness of breath. She is currently on no antihyperlipidemic treatment. The current treatment provides no improvement of lipids. Compliance problems include adherence to diet.  Risk factors for coronary artery disease include dyslipidemia, family history, hypertension and a sedentary lifestyle.  Gastroesophageal Reflux She reports no chest pain, no coughing, no heartburn, no sore throat or no wheezing. This is a chronic problem. The current episode started more than 1 year ago. The problem occurs rarely. The symptoms are aggravated by certain foods. Pertinent negatives include no fatigue or muscle weakness. She has tried a PPI for the  symptoms. The treatment provided significant relief.  Anxiety Presents for follow-up visit. Onset was 1 to 6 months ago. Symptoms include excessive worry, nervous/anxious behavior and palpitations ("if i get really upset"). Patient reports no chest pain, decreased concentration, depressed mood, insomnia, panic or shortness of breath. Symptoms occur occasionally.   Her past medical history is significant for anemia and anxiety/panic attacks. There is no history of depression.  Anemia Presents for follow-up visit. Symptoms include malaise/fatigue and palpitations ("if i get really upset"). There has been no bruising/bleeding easily. Past treatments include changes in diet and parenteral iron supplements (PT is followed by hematolgists). There is no history of heart failure or hypothyroidism. Family history includes iron deficiency.      Review of Systems  Constitutional: Positive for malaise/fatigue. Negative for fatigue.  HENT: Negative.  Negative for sore throat.   Eyes: Negative.   Respiratory: Negative.  Negative for cough, shortness of breath and wheezing.   Cardiovascular: Positive for palpitations ("if i get really upset"). Negative for chest pain.  Gastrointestinal: Negative.  Negative for heartburn.  Endocrine: Negative.   Genitourinary: Negative.   Musculoskeletal: Negative.  Negative for muscle weakness.  Neurological: Negative.  Negative for headaches.  Hematological: Negative.  Does not bruise/bleed easily.  Psychiatric/Behavioral: Negative for decreased concentration. The patient is nervous/anxious. The patient does not have insomnia.   All other systems reviewed and are negative.      Objective:   Physical Exam  Constitutional: She is oriented to person, place, and time. She appears well-developed and well-nourished. No distress.  HENT:  Head: Normocephalic and atraumatic.  Right Ear: External ear normal.  Left Ear: External ear normal.  Nose: Nose normal.    Mouth/Throat: Oropharynx is clear and moist.  Eyes: Pupils are equal, round, and reactive to light.  Neck: Normal range of motion. Neck supple. No thyromegaly present.  Cardiovascular: Normal rate, regular rhythm, normal heart sounds and intact distal pulses.   No murmur heard. Pulmonary/Chest: Effort normal and breath sounds normal. No respiratory distress. She has no wheezes.  Abdominal: Soft. Bowel sounds are normal. She exhibits no distension. There is no tenderness.  Musculoskeletal: Normal range of motion. She exhibits no edema or tenderness.  Neurological: She is alert and oriented to person, place, and time. She has normal reflexes. No cranial nerve deficit.  Skin: Skin is warm and dry.  Psychiatric: She has a normal mood and affect. Her behavior is normal. Judgment and thought content normal.  Vitals reviewed.   BP 136/88 mmHg  Pulse 87  Temp(Src) 98.8 F (37.1 C) (Oral)  Ht 5\' 2"  (1.575 m)  Wt 252 lb 12.8 oz (114.669 kg)  BMI 46.23 kg/m2       Assessment & Plan:  1. Essential hypertension  2. Chronic obstructive pulmonary disease, unspecified COPD type ([place])  3. Gastroesophageal reflux disease, esophagitis presence not specified  4. Vitamin B 12 deficiency  5. Depression  6. GAD (generalized anxiety disorder)  7. Hyperlipidemia  8. Tobacco user  9. Vitamin D deficiency  10. Iron deficiency anemia  11. Metabolic syndrome   Continue all meds- Keep appts with Hemologists  Pt states she wants to hold off on lab work until next visit! Health Maintenance reviewed Diet and exercise encouraged- Given information on low fat diet RTO 3 months   [name], [place]

## 2015-03-23 NOTE — Patient Instructions (Addendum)
Health Maintenance, Female Adopting a healthy lifestyle and getting preventive care can go a long way to promote health and wellness. Talk with your health care provider about what schedule of regular examinations is right for you. This is a good chance for you to check in with your provider about disease prevention and staying healthy. In between checkups, there are plenty of things you can do on your own. Experts have done a lot of research about which lifestyle changes and preventive measures are most likely to keep you healthy. Ask your health care provider for more information. WEIGHT AND DIET  Eat a healthy diet  Be sure to include plenty of vegetables, fruits, low-fat dairy products, and lean protein.  Do not eat a lot of foods high in solid fats, added sugars, or salt.  Get regular exercise. This is one of the most important things you can do for your health.  Most adults should exercise for at least 150 minutes each week. The exercise should increase your heart rate and make you sweat (moderate-intensity exercise).  Most adults should also do strengthening exercises at least twice a week. This is in addition to the moderate-intensity exercise.  Maintain a healthy weight  Body mass index (BMI) is a measurement that can be used to identify possible weight problems. It estimates body fat based on height and weight. Your health care provider can help determine your BMI and help you achieve or maintain a healthy weight.  For females 20 years of age and older:   A BMI below 18.5 is considered underweight.  A BMI of 18.5 to 24.9 is normal.  A BMI of 25 to 29.9 is considered overweight.  A BMI of 30 and above is considered obese.  Watch levels of cholesterol and blood lipids  You should start having your blood tested for lipids and cholesterol at 51 years of age, then have this test every 5 years.  You may need to have your cholesterol levels checked more often if:  Your lipid  or cholesterol levels are high.  You are older than 50 years of age.  You are at high risk for heart disease.  CANCER SCREENING   Lung Cancer  Lung cancer screening is recommended for adults 55-80 years old who are at high risk for lung cancer because of a history of smoking.  A yearly low-dose CT scan of the lungs is recommended for people who:  Currently smoke.  Have quit within the past 15 years.  Have at least a 30-pack-year history of smoking. A pack year is smoking an average of one pack of cigarettes a day for 1 year.  Yearly screening should continue until it has been 15 years since you quit.  Yearly screening should stop if you develop a health problem that would prevent you from having lung cancer treatment.  Breast Cancer  Practice breast self-awareness. This means understanding how your breasts normally appear and feel.  It also means doing regular breast self-exams. Let your health care provider know about any changes, no matter how small.  If you are in your 20s or 30s, you should have a clinical breast exam (CBE) by a health care provider every 1-3 years as part of a regular health exam.  If you are 40 or older, have a CBE every year. Also consider having a breast X-ray (mammogram) every year.  If you have a family history of breast cancer, talk to your health care provider about genetic screening.  If you   are at high risk for breast cancer, talk to your health care provider about having an MRI and a mammogram every year.  Breast cancer gene (BRCA) assessment is recommended for women who have family members with BRCA-related cancers. BRCA-related cancers include:  Breast.  Ovarian.  Tubal.  Peritoneal cancers.  Results of the assessment will determine the need for genetic counseling and BRCA1 and BRCA2 testing. Cervical Cancer Your health care provider may recommend that you be screened regularly for cancer of the pelvic organs (ovaries, uterus, and  vagina). This screening involves a pelvic examination, including checking for microscopic changes to the surface of your cervix (Pap test). You may be encouraged to have this screening done every 3 years, beginning at age 21.  For women ages 30-65, health care providers may recommend pelvic exams and Pap testing every 3 years, or they may recommend the Pap and pelvic exam, combined with testing for human papilloma virus (HPV), every 5 years. Some types of HPV increase your risk of cervical cancer. Testing for HPV may also be done on women of any age with unclear Pap test results.  Other health care providers may not recommend any screening for nonpregnant women who are considered low risk for pelvic cancer and who do not have symptoms. Ask your health care provider if a screening pelvic exam is right for you.  If you have had past treatment for cervical cancer or a condition that could lead to cancer, you need Pap tests and screening for cancer for at least 20 years after your treatment. If Pap tests have been discontinued, your risk factors (such as having a new sexual partner) need to be reassessed to determine if screening should resume. Some women have medical problems that increase the chance of getting cervical cancer. In these cases, your health care provider may recommend more frequent screening and Pap tests. Colorectal Cancer  This type of cancer can be detected and often prevented.  Routine colorectal cancer screening usually begins at 50 years of age and continues through 51 years of age.  Your health care provider may recommend screening at an earlier age if you have risk factors for colon cancer.  Your health care provider may also recommend using home test kits to check for hidden blood in the stool.  A small camera at the end of a tube can be used to examine your colon directly (sigmoidoscopy or colonoscopy). This is done to check for the earliest forms of colorectal  cancer.  Routine screening usually begins at age 50.  Direct examination of the colon should be repeated every 5-10 years through 51 years of age. However, you may need to be screened more often if early forms of precancerous polyps or small growths are found. Skin Cancer  Check your skin from head to toe regularly.  Tell your health care provider about any new moles or changes in moles, especially if there is a change in a mole's shape or color.  Also tell your health care provider if you have a mole that is larger than the size of a pencil eraser.  Always use sunscreen. Apply sunscreen liberally and repeatedly throughout the day.  Protect yourself by wearing long sleeves, pants, a wide-brimmed hat, and sunglasses whenever you are outside. HEART DISEASE, DIABETES, AND HIGH BLOOD PRESSURE   High blood pressure causes heart disease and increases the risk of stroke. High blood pressure is more likely to develop in:  People who have blood pressure in the high end   of the normal range (130-139/85-89 mm Hg).  People who are overweight or obese.  People who are African American.  If you are 38-23 years of age, have your blood pressure checked every 3-5 years. If you are 61 years of age or older, have your blood pressure checked every year. You should have your blood pressure measured twice--once when you are at a hospital or clinic, and once when you are not at a hospital or clinic. Record the average of the two measurements. To check your blood pressure when you are not at a hospital or clinic, you can use:  An automated blood pressure machine at a pharmacy.  A home blood pressure monitor.  If you are between 45 years and 39 years old, ask your health care provider if you should take aspirin to prevent strokes.  Have regular diabetes screenings. This involves taking a blood sample to check your fasting blood sugar level.  If you are at a normal weight and have a low risk for diabetes,  have this test once every three years after 51 years of age.  If you are overweight and have a high risk for diabetes, consider being tested at a younger age or more often. PREVENTING INFECTION  Hepatitis B  If you have a higher risk for hepatitis B, you should be screened for this virus. You are considered at high risk for hepatitis B if:  You were born in a country where hepatitis B is common. Ask your health care provider which countries are considered high risk.  Your parents were born in a high-risk country, and you have not been immunized against hepatitis B (hepatitis B vaccine).  You have HIV or AIDS.  You use needles to inject street drugs.  You live with someone who has hepatitis B.  You have had sex with someone who has hepatitis B.  You get hemodialysis treatment.  You take certain medicines for conditions, including cancer, organ transplantation, and autoimmune conditions. Hepatitis C  Blood testing is recommended for:  Everyone born from [birth year] through 1967.  Anyone with known risk factors for hepatitis C. Sexually transmitted infections (STIs)  You should be screened for sexually transmitted infections (STIs) including gonorrhea and chlamydia if:  You are sexually active and are younger than 51 years of age.  You are older than 51 years of age and your health care provider tells you that you are at risk for this type of infection.  Your sexual activity has changed since you were last screened and you are at an increased risk for chlamydia or gonorrhea. Ask your health care provider if you are at risk.  If you do not have HIV, but are at risk, it may be recommended that you take a prescription medicine daily to prevent HIV infection. This is called pre-exposure prophylaxis (PrEP). You are considered at risk if:  You are sexually active and do not regularly use condoms or know the HIV status of your partner(s).  You take drugs by injection.  You are sexually  active with a partner who has HIV. Talk with your health care provider about whether you are at high risk of being infected with HIV. If you choose to begin PrEP, you should first be tested for HIV. You should then be tested every 3 months for as long as you are taking PrEP.  PREGNANCY   If you are premenopausal and you may become pregnant, ask your health care provider about preconception counseling.  If you may  at risk if:    You are sexually active and do not regularly use condoms or know the HIV status of your partner(s).    You take drugs by injection.    You are sexually  active with a partner who has HIV.  Talk with your health care provider about whether you are at high risk of being infected with HIV. If you choose to begin PrEP, you should first be tested for HIV. You should then be tested every 3 months for as long as you are taking PrEP.   PREGNANCY   · If you are premenopausal and you may become pregnant, ask your health care provider about preconception counseling.  · If you may become pregnant, take 400 to 800 micrograms (mcg) of folic acid every day.  · If you want to prevent pregnancy, talk to your health care provider about birth control (contraception).  OSTEOPOROSIS AND MENOPAUSE   · Osteoporosis is a disease in which the bones lose minerals and strength with aging. This can result in serious bone fractures. Your risk for osteoporosis can be identified using a bone density scan.  · If you are 65 years of age or older, or if you are at risk for osteoporosis and fractures, ask your health care provider if you should be screened.  · Ask your health care provider whether you should take a calcium or vitamin D supplement to lower your risk for osteoporosis.  · Menopause may have certain physical symptoms and risks.  · Hormone replacement therapy may reduce some of these symptoms and risks.  Talk to your health care provider about whether hormone replacement therapy is right for you.   HOME CARE INSTRUCTIONS   · Schedule regular health, dental, and eye exams.  · Stay current with your immunizations.    · Do not use any tobacco products including cigarettes, chewing tobacco, or electronic cigarettes.  · If you are pregnant, do not drink alcohol.  · If you are breastfeeding, limit how much and how often you drink alcohol.  · Limit alcohol intake to no more than 1 drink per day for nonpregnant women. One drink equals 12 ounces of beer, 5 ounces of wine, or 1½ ounces of hard liquor.  · Do not use street drugs.  · Do not share needles.  · Ask your health care provider for help if  you need support or information about quitting drugs.  · Tell your health care provider if you often feel depressed.  · Tell your health care provider if you have ever been abused or do not feel safe at home.     This information is not intended to replace advice given to you by your health care provider. Make sure you discuss any questions you have with your health care provider.     Document Released: 11/05/2010 Document Revised: 05/13/2014 Document Reviewed: 03/24/2013  [employer]vier Interactive Patient Education ©2016 [employer]  Fat and Cholesterol Restricted Diet  High levels of fat and cholesterol in your blood may lead to various health problems, such as diseases of the heart, blood vessels, gallbladder, liver, and pancreas. Fats are concentrated sources of energy that come in various forms. Certain types of fat, including saturated fat, may be harmful in excess. Cholesterol is a substance needed by your body in small amounts. Your body makes all the cholesterol it needs. Excess cholesterol comes from the food you eat.  When you have high levels of cholesterol and saturated fat in your blood, health problems   can develop because the excess fat and cholesterol will gather along the walls of your blood vessels, causing them to narrow. Choosing the right foods will help you control your intake of fat and cholesterol. This will help keep the levels of these substances in your blood within normal limits and reduce your risk of disease.  WHAT IS MY PLAN?  Your health care provider recommends that you:  · Get no more than __________ % of the total calories in your daily diet from fat.  · Limit your intake of saturated fat to less than ______% of your total calories each day.  · Limit the amount of cholesterol in your diet to less than _________mg per day.  WHAT TYPES OF FAT SHOULD I CHOOSE?  · Choose healthy fats more often. Choose monounsaturated and polyunsaturated fats, such as olive and canola oil, flaxseeds,  walnuts, almonds, and seeds.  · Eat more omega-3 fats. Good choices include salmon, mackerel, sardines, tuna, flaxseed oil, and ground flaxseeds. Aim to eat fish at least two times a week.  · Limit saturated fats. Saturated fats are primarily found in animal products, such as meats, butter, and cream. Plant sources of saturated fats include palm oil, palm kernel oil, and coconut oil.  · Avoid foods with partially hydrogenated oils in them. These contain trans fats. Examples of foods that contain trans fats are stick margarine, some tub margarines, cookies, crackers, and other baked goods.  WHAT GENERAL GUIDELINES DO I NEED TO FOLLOW?  These guidelines for healthy eating will help you control your intake of fat and cholesterol:  · Check food labels carefully to identify foods with trans fats or high amounts of saturated fat.  · Fill one half of your plate with vegetables and green salads.  · Fill one fourth of your plate with whole grains. Look for the word "whole" as the first word in the ingredient list.  · Fill one fourth of your plate with lean protein foods.  · Limit fruit to two servings a day. Choose fruit instead of juice.  · Eat more foods that contain soluble fiber. Examples of foods that contain this type of fiber are apples, broccoli, carrots, beans, peas, and barley. Aim to get 20-30 g of fiber per day.  · Eat more home-cooked food and less restaurant, buffet, and fast food.  · Limit or avoid alcohol.  · Limit foods high in starch and sugar.  · Limit fried foods.  · Cook foods using methods other than frying. Baking, boiling, grilling, and broiling are all great options.  · Lose weight if you are overweight. Losing just 5-10% of your initial body weight can help your overall health and prevent diseases such as diabetes and heart disease.  WHAT FOODS CAN I EAT?  Grains  Whole grains, such as whole wheat or whole grain breads, crackers, cereals, and pasta. Unsweetened oatmeal, bulgur, barley, quinoa, or  brown rice. Corn or whole wheat flour tortillas.  Vegetables  Fresh or frozen vegetables (raw, steamed, roasted, or grilled). Green salads.  Fruits  All fresh, canned (in natural juice), or frozen fruits.  Meat and Other Protein Products  Ground beef (85% or leaner), grass-fed beef, or beef trimmed of fat. Skinless chicken or turkey. Ground chicken or turkey. Pork trimmed of fat. All fish and seafood. Eggs. Dried beans, peas, or lentils. Unsalted nuts or seeds. Unsalted canned or dry beans.  Dairy  Low-fat dairy products, such as skim or 1% milk, 2% or reduced-fat cheeses, low-fat   cereals. Cookies, pies, cakes, donuts, muffins, and ice cream. Fats and Oils Butter, stick  margarine, lard, shortening, ghee, or bacon fat. Coconut, palm kernel, or palm oils. Beverages Alcohol. Sweetened drinks (such as sodas, lemonade, and fruit drinks or punches). The items listed above may not be a complete list of foods and beverages to avoid. Contact your dietitian for more information.   This information is not intended to replace advice given to you by your health care provider. Make sure you discuss any questions you have with your health care provider.   Document Released: 04/22/2005 Document Revised: 05/13/2014 Document Reviewed: 07/21/2013 [employer] Interactive Patient Education [employer].

## 2015-04-12 ENCOUNTER — Telehealth (HOSPITAL_COMMUNITY): Payer: Self-pay | Admitting: Lab

## 2015-04-27 ENCOUNTER — Other Ambulatory Visit (HOSPITAL_COMMUNITY): Payer: BLUE CROSS/BLUE SHIELD

## 2015-06-06 ENCOUNTER — Other Ambulatory Visit: Payer: Self-pay | Admitting: Family

## 2015-06-09 ENCOUNTER — Ambulatory Visit: Payer: BLUE CROSS/BLUE SHIELD | Admitting: Family

## 2015-06-12 ENCOUNTER — Encounter: Payer: Self-pay | Admitting: Family

## 2015-06-22 ENCOUNTER — Other Ambulatory Visit: Payer: Self-pay | Admitting: Family Medicine

## 2015-06-22 ENCOUNTER — Other Ambulatory Visit (HOSPITAL_COMMUNITY): Payer: BLUE CROSS/BLUE SHIELD

## 2015-06-22 ENCOUNTER — Ambulatory Visit (HOSPITAL_COMMUNITY): Payer: BLUE CROSS/BLUE SHIELD | Admitting: Oncology

## 2015-06-22 DIAGNOSIS — Z1231 Encounter for screening mammogram for malignant neoplasm of breast: Secondary | ICD-10-CM

## 2015-06-29 ENCOUNTER — Ambulatory Visit (HOSPITAL_COMMUNITY)
Admission: RE | Admit: 2015-06-29 | Discharge: 2015-06-29 | Disposition: A | Discharge status: Home / Self Care | Payer: BLUE CROSS/BLUE SHIELD | Source: Ambulatory Visit | Attending: Family Medicine | Admitting: Family Medicine

## 2015-06-29 ENCOUNTER — Ambulatory Visit (INDEPENDENT_AMBULATORY_CARE_PROVIDER_SITE_OTHER): Payer: BLUE CROSS/BLUE SHIELD | Admitting: Family

## 2015-06-29 ENCOUNTER — Encounter: Payer: Self-pay | Admitting: Family

## 2015-06-29 VITALS — BP 124/79 | HR 91 | Temp 97.1°F | Ht 62.0 in | Wt 249.2 lb

## 2015-06-29 DIAGNOSIS — I10 Essential (primary) hypertension: Secondary | ICD-10-CM

## 2015-06-29 DIAGNOSIS — Z0000 Encounter for general adult medical examination without abnormal findings: Secondary | ICD-10-CM | POA: Diagnosis not present

## 2015-06-29 DIAGNOSIS — M5441 Lumbago with sciatica, right side: Secondary | ICD-10-CM

## 2015-06-29 DIAGNOSIS — D509 Iron deficiency anemia, unspecified: Secondary | ICD-10-CM

## 2015-06-29 DIAGNOSIS — F411 Generalized anxiety disorder: Secondary | ICD-10-CM

## 2015-06-29 DIAGNOSIS — Z1231 Encounter for screening mammogram for malignant neoplasm of breast: Secondary | ICD-10-CM | POA: Diagnosis present

## 2015-06-29 DIAGNOSIS — J449 Chronic obstructive pulmonary disease, unspecified: Secondary | ICD-10-CM

## 2015-06-29 DIAGNOSIS — F329 Major depressive disorder, single episode, unspecified: Secondary | ICD-10-CM

## 2015-06-29 DIAGNOSIS — Z720 Tobacco use: Secondary | ICD-10-CM

## 2015-06-29 DIAGNOSIS — E538 Deficiency of other specified B group vitamins: Secondary | ICD-10-CM

## 2015-06-29 DIAGNOSIS — K219 Gastro-esophageal reflux disease without esophagitis: Secondary | ICD-10-CM

## 2015-06-29 DIAGNOSIS — Z1159 Encounter for screening for other viral diseases: Secondary | ICD-10-CM

## 2015-06-29 DIAGNOSIS — F32A Depression, unspecified: Secondary | ICD-10-CM

## 2015-06-29 DIAGNOSIS — E8881 Metabolic syndrome: Secondary | ICD-10-CM

## 2015-06-29 DIAGNOSIS — E785 Hyperlipidemia, unspecified: Secondary | ICD-10-CM

## 2015-06-29 MED ORDER — OMEPRAZOLE 20 MG PO CPDR: 20.0000 mg | DELAYED_RELEASE_CAPSULE | Freq: Every day | ORAL | Status: DC

## 2015-06-29 MED ORDER — IBUPROFEN 600 MG PO TABS: 600.0000 mg | ORAL_TABLET | Freq: Three times a day (TID) | ORAL | Status: DC | PRN

## 2015-06-29 MED ORDER — LOSARTAN POTASSIUM-HCTZ 100-25 MG PO TABS: 1.0000 | ORAL_TABLET | Freq: Every day | ORAL | Status: DC

## 2015-06-29 MED ORDER — PREDNISONE 10 MG (21) PO TBPK: 10.0000 mg | ORAL_TABLET | Freq: Every day | ORAL | Status: DC

## 2015-06-29 MED ORDER — BUSPIRONE HCL 10 MG PO TABS: 10.0000 mg | ORAL_TABLET | Freq: Three times a day (TID) | ORAL | Status: DC

## 2015-06-29 NOTE — Progress Notes (Signed)
Subjective:    Patient ID: Hayley Crawford, female    DOB: 11/18/1965, 50 y.o.   MRN: [id number]  Pt presents to the office today for CPE and chronic follow up.  Hypertension This is a chronic problem. The current episode started more than 1 year ago. The problem has been resolved since onset. The problem is controlled. Associated symptoms include anxiety, malaise/fatigue, palpitations ("if i get really upset") and peripheral edema ("trace amount in ankles"). Pertinent negatives include no chest pain, headaches or shortness of breath. Risk factors for coronary artery disease include dyslipidemia, obesity, post-menopausal state, family history and smoking/tobacco exposure. Past treatments include diuretics and angiotensin blockers. The current treatment provides significant improvement. There is no history of kidney disease, CAD/MI, CVA, heart failure or a thyroid problem. Identifiable causes of hypertension include sleep apnea.  Hyperlipidemia This is a chronic problem. The current episode started more than 1 year ago. The problem is uncontrolled. Recent lipid tests were reviewed and are high. Exacerbating diseases include obesity. She has no history of diabetes or hypothyroidism. Factors aggravating her hyperlipidemia include smoking. Associated symptoms include leg pain. Pertinent negatives include no chest pain or shortness of breath. She is currently on no antihyperlipidemic treatment. The current treatment provides no improvement of lipids. Compliance problems include adherence to diet.  Risk factors for coronary artery disease include dyslipidemia, family history, hypertension and a sedentary lifestyle.  Gastroesophageal Reflux She reports no chest pain, no coughing, no heartburn, no sore throat or no wheezing. This is a chronic problem. The current episode started more than 1 year ago. The problem occurs rarely. The symptoms are aggravated by certain foods. Pertinent negatives include no fatigue or  muscle weakness. She has tried a PPI for the symptoms. The treatment provided significant relief.  Anxiety Presents for follow-up visit. Onset was 1 to 6 months ago. Symptoms include excessive worry, nervous/anxious behavior and palpitations ("if i get really upset"). Patient reports no chest pain, decreased concentration, depressed mood, insomnia, panic or shortness of breath. Symptoms occur occasionally.   Her past medical history is significant for anemia and anxiety/panic attacks. There is no history of depression.  Anemia Presents for follow-up visit. Symptoms include malaise/fatigue and palpitations ("if i get really upset"). There has been no bruising/bleeding easily. Past treatments include changes in diet (PT is followed by hematolgists). There is no history of heart failure or hypothyroidism. Family history includes iron deficiency.  Back Pain This is a new problem. The current episode started more than 1 year ago. The problem occurs intermittently ("of i walk alot"). The problem is unchanged. The pain is present in the gluteal. The quality of the pain is described as aching. The pain radiates to the right thigh and right knee. The pain is at a severity of 3/10. The pain is moderate. The symptoms are aggravated by bending and standing. Associated symptoms include leg pain and numbness. Pertinent negatives include no bladder incontinence, bowel incontinence, chest pain, headaches, tingling or weakness. She has tried ice and NSAIDs for the symptoms. The treatment provided mild relief.  COPD PT currently taking Breo only when "I get sick". Pt states her breathing is doing "good". I discussed that maintenance inhalers need to be taken every day. PT states she is smoking about a pack and half a day. PT states she is planning on quitting, but just hasn't yet.     Review of Systems  Constitutional: Positive for malaise/fatigue. Negative for fatigue.  HENT: Negative.  Negative for sore throat.  Eyes: Negative.   Respiratory: Negative.  Negative for cough, shortness of breath and wheezing.   Cardiovascular: Positive for palpitations ("if i get really upset"). Negative for chest pain.  Gastrointestinal: Negative.  Negative for heartburn and bowel incontinence.  Endocrine: Negative.   Genitourinary: Negative.  Negative for bladder incontinence.  Musculoskeletal: Positive for back pain. Negative for muscle weakness.  Neurological: Positive for numbness. Negative for tingling, weakness and headaches.  Hematological: Negative.  Does not bruise/bleed easily.  Psychiatric/Behavioral: Negative for decreased concentration. The patient is nervous/anxious. The patient does not have insomnia.   All other systems reviewed and are negative.      Objective:   Physical Exam  Constitutional: She is oriented to person, place, and time. She appears well-developed and well-nourished. No distress.  HENT:  Head: Normocephalic and atraumatic.  Right Ear: External ear normal.  Left Ear: External ear normal.  Nose: Nose normal.  Mouth/Throat: Oropharynx is clear and moist.  Eyes: Pupils are equal, round, and reactive to light.  Neck: Normal range of motion. Neck supple. No thyromegaly present.  Cardiovascular: Normal rate, regular rhythm, normal heart sounds and intact distal pulses.   No murmur heard. Pulmonary/Chest: Effort normal and breath sounds normal. No respiratory distress. She has no wheezes.  Abdominal: Soft. Bowel sounds are normal. She exhibits no distension. There is no tenderness.  Musculoskeletal: Normal range of motion. She exhibits no edema or tenderness.  Negative leg raise test  Neurological: She is alert and oriented to person, place, and time. She has normal reflexes. No cranial nerve deficit.  Skin: Skin is warm and dry.  Psychiatric: She has a normal mood and affect. Her behavior is normal. Judgment and thought content normal.  Vitals reviewed.   BP 124/79 mmHg  Pulse  91  Temp(Src) 97.1 F (36.2 C) (Oral)  Ht '5\' 2"'  (1.575 m)  Wt 249 lb 3.2 oz (113.036 kg)  BMI 45.57 kg/m2  LMP  (Approximate)       Assessment & Plan:  1. Essential hypertension - CMP14+EGFR - losartan-hydrochlorothiazide (HYZAAR) 100-25 MG tablet; Take 1 tablet by mouth daily.  Dispense: 90 tablet; Refill: 3  2. Chronic obstructive pulmonary disease, unspecified COPD type (HCC) - XBD53+GDJM  3. Metabolic syndrome - EQA83+MHDQ  4. Iron deficiency anemia - CMP14+EGFR - Anemia Profile B  5. Tobacco user - CMP14+EGFR  6. Hyperlipidemia - CMP14+EGFR - Lipid panel  7. Depression - CMP14+EGFR  8. GAD (generalized anxiety disorder) - CMP14+EGFR - busPIRone (BUSPAR) 10 MG tablet; Take 1 tablet (10 mg total) by mouth 3 (three) times daily.  Dispense: 90 tablet; Refill: 1  9. Vitamin B 12 deficiency - CMP14+EGFR - Anemia Profile B  10. Gastroesophageal reflux disease, esophagitis presence not specified - CMP14+EGFR - omeprazole (PRILOSEC) 20 MG capsule; Take 1 capsule (20 mg total) by mouth daily.  Dispense: 90 capsule; Refill: 2  11. Right-sided low back pain with right-sided sciatica - CMP14+EGFR - ibuprofen (ADVIL,MOTRIN) 600 MG tablet; Take 1 tablet (600 mg total) by mouth every 8 (eight) hours as needed.  Dispense: 90 tablet; Refill: 1 - predniSONE (STERAPRED UNI-PAK 21 TAB) 10 MG (21) TBPK tablet; Take 1 tablet (10 mg total) by mouth daily. As directed x 6 days  Dispense: 21 tablet; Refill: 0  12. Annual physical exam - CMP14+EGFR - Lipid panel - VITAMIN D 25 Hydroxy (Vit-D Deficiency, Fractures) - Thyroid Panel With TSH - Anemia Profile B - Hepatitis C antibody  13. Need for hepatitis C screening test - CMP14+EGFR -  Hepatitis C antibody   Continue all meds Labs pending Health Maintenance reviewed Diet and exercise encouraged RTO 3 months  [name], FNP

## 2015-06-29 NOTE — Patient Instructions (Signed)
Sciatica With Rehab The sciatic nerve runs from the back down the leg and is responsible for sensation and control of the muscles in the back (posterior) side of the thigh, lower leg, and foot. Sciatica is a condition that is characterized by inflammation of this nerve.  SYMPTOMS   Signs of nerve damage, including numbness and/or weakness along the posterior side of the lower extremity.  Pain in the back of the thigh that may also travel down the leg.  Pain that worsens when sitting for long periods of time.  Occasionally, pain in the back or buttock. CAUSES  Inflammation of the sciatic nerve is the cause of sciatica. The inflammation is due to something irritating the nerve. Common sources of irritation include:  Sitting for long periods of time.  Direct trauma to the nerve.  Arthritis of the spine.  Herniated or ruptured disk.  Slipping of the vertebrae (spondylolisthesis).  Pressure from soft tissues, such as muscles or ligament-like tissue (fascia). RISK INCREASES WITH:  Sports that place pressure or stress on the spine (football or weightlifting).  Poor strength and flexibility.  Failure to warm up properly before activity.  Family history of low back pain or disk disorders.  Previous back injury or surgery.  Poor body mechanics, especially when lifting, or poor posture. PREVENTION   Warm up and stretch properly before activity.  Maintain physical fitness:  Strength, flexibility, and endurance.  Cardiovascular fitness.  Learn and use proper technique, especially with posture and lifting. When possible, have coach correct improper technique.  Avoid activities that place stress on the spine. PROGNOSIS If treated properly, then sciatica usually resolves within 6 weeks. However, occasionally surgery is necessary.  RELATED COMPLICATIONS   Permanent nerve damage, including pain, numbness, tingle, or weakness.  Chronic back pain.  Risks of surgery: infection,  bleeding, nerve damage, or damage to surrounding tissues. TREATMENT Treatment initially involves resting from any activities that aggravate your symptoms. The use of ice and medication may help reduce pain and inflammation. The use of strengthening and stretching exercises may help reduce pain with activity. These exercises may be performed at home or with referral to a therapist. A therapist may recommend further treatments, such as transcutaneous electronic nerve stimulation (TENS) or ultrasound. Your caregiver may recommend corticosteroid injections to help reduce inflammation of the sciatic nerve. If symptoms persist despite non-surgical (conservative) treatment, then surgery may be recommended. MEDICATION  If pain medication is necessary, then nonsteroidal anti-inflammatory medications, such as aspirin and ibuprofen, or other minor pain relievers, such as acetaminophen, are often recommended.  Do not take pain medication for 7 days before surgery.  Prescription pain relievers may be given if deemed necessary by your caregiver. Use only as directed and only as much as you need.  Ointments applied to the skin may be helpful.  Corticosteroid injections may be given by your caregiver. These injections should be reserved for the most serious cases, because they may only be given a certain number of times. HEAT AND COLD  Cold treatment (icing) relieves pain and reduces inflammation. Cold treatment should be applied for 10 to 15 minutes every 2 to 3 hours for inflammation and pain and immediately after any activity that aggravates your symptoms. Use ice packs or massage the area with a piece of ice (ice massage).  Heat treatment may be used prior to performing the stretching and strengthening activities prescribed by your caregiver, physical therapist, or athletic trainer. Use a heat pack or soak the injury in warm water.   SEEK MEDICAL CARE IF:  Treatment seems to offer no benefit, or the condition  worsens.  Any medications produce adverse side effects. EXERCISES  RANGE OF MOTION (ROM) AND STRETCHING EXERCISES - Sciatica Most people with sciatic will find that their symptoms worsen with either excessive bending forward (flexion) or arching at the low back (extension). The exercises which will help resolve your symptoms will focus on the opposite motion. Your physician, physical therapist or athletic trainer will help you determine which exercises will be most helpful to resolve your low back pain. Do not complete any exercises without first consulting with your clinician. Discontinue any exercises which worsen your symptoms until you speak to your clinician. If you have pain, numbness or tingling which travels down into your buttocks, leg or foot, the goal of the therapy is for these symptoms to move closer to your back and eventually resolve. Occasionally, these leg symptoms will get better, but your low back pain may worsen; this is typically an indication of progress in your rehabilitation. Be certain to be very alert to any changes in your symptoms and the activities in which you participated in the 24 hours prior to the change. Sharing this information with your clinician will allow him/her to most efficiently treat your condition. These exercises may help you when beginning to rehabilitate your injury. Your symptoms may resolve with or without further involvement from your physician, physical therapist or athletic trainer. While completing these exercises, remember:   Restoring tissue flexibility helps normal motion to return to the joints. This allows healthier, less painful movement and activity.  An effective stretch should be held for at least 30 seconds.  A stretch should never be painful. You should only feel a gentle lengthening or release in the stretched tissue. FLEXION RANGE OF MOTION AND STRETCHING EXERCISES: STRETCH - Flexion, Single Knee to Chest   Lie on a firm bed or floor  with both legs extended in front of you.  Keeping one leg in contact with the floor, bring your opposite knee to your chest. Hold your leg in place by either grabbing behind your thigh or at your knee.  Pull until you feel a gentle stretch in your low back. Hold __________ seconds.  Slowly release your grasp and repeat the exercise with the opposite side. Repeat __________ times. Complete this exercise __________ times per day.  STRETCH - Flexion, Double Knee to Chest  Lie on a firm bed or floor with both legs extended in front of you.  Keeping one leg in contact with the floor, bring your opposite knee to your chest.  Tense your stomach muscles to support your back and then lift your other knee to your chest. Hold your legs in place by either grabbing behind your thighs or at your knees.  Pull both knees toward your chest until you feel a gentle stretch in your low back. Hold __________ seconds.  Tense your stomach muscles and slowly return one leg at a time to the floor. Repeat __________ times. Complete this exercise __________ times per day.  STRETCH - Low Trunk Rotation   Lie on a firm bed or floor. Keeping your legs in front of you, bend your knees so they are both pointed toward the ceiling and your feet are flat on the floor.  Extend your arms out to the side. This will stabilize your upper body by keeping your shoulders in contact with the floor.  Gently and slowly drop both knees together to one side until   you feel a gentle stretch in your low back. Hold for __________ seconds.  Tense your stomach muscles to support your low back as you bring your knees back to the starting position. Repeat the exercise to the other side. Repeat __________ times. Complete this exercise __________ times per day  EXTENSION RANGE OF MOTION AND FLEXIBILITY EXERCISES: STRETCH - Extension, Prone on Elbows  Lie on your stomach on the floor, a bed will be too soft. Place your palms about shoulder  width apart and at the height of your head.  Place your elbows under your shoulders. If this is too painful, stack pillows under your chest.  Allow your body to relax so that your hips drop lower and make contact more completely with the floor.  Hold this position for __________ seconds.  Slowly return to lying flat on the floor. Repeat __________ times. Complete this exercise __________ times per day.  RANGE OF MOTION - Extension, Prone Press Ups  Lie on your stomach on the floor, a bed will be too soft. Place your palms about shoulder width apart and at the height of your head.  Keeping your back as relaxed as possible, slowly straighten your elbows while keeping your hips on the floor. You may adjust the placement of your hands to maximize your comfort. As you gain motion, your hands will come more underneath your shoulders.  Hold this position __________ seconds.  Slowly return to lying flat on the floor. Repeat __________ times. Complete this exercise __________ times per day.  STRENGTHENING EXERCISES - Sciatica  These exercises may help you when beginning to rehabilitate your injury. These exercises should be done near your "sweet spot." This is the neutral, low-back arch, somewhere between fully rounded and fully arched, that is your least painful position. When performed in this safe range of motion, these exercises can be used for people who have either a flexion or extension based injury. These exercises may resolve your symptoms with or without further involvement from your physician, physical therapist or athletic trainer. While completing these exercises, remember:   Muscles can gain both the endurance and the strength needed for everyday activities through controlled exercises.  Complete these exercises as instructed by your physician, physical therapist or athletic trainer. Progress with the resistance and repetition exercises only as your caregiver advises.  You may  experience muscle soreness or fatigue, but the pain or discomfort you are trying to eliminate should never worsen during these exercises. If this pain does worsen, stop and make certain you are following the directions exactly. If the pain is still present after adjustments, discontinue the exercise until you can discuss the trouble with your clinician. STRENGTHENING - Deep Abdominals, Pelvic Tilt   Lie on a firm bed or floor. Keeping your legs in front of you, bend your knees so they are both pointed toward the ceiling and your feet are flat on the floor.  Tense your lower abdominal muscles to press your low back into the floor. This motion will rotate your pelvis so that your tail bone is scooping upwards rather than pointing at your feet or into the floor.  With a gentle tension and even breathing, hold this position for __________ seconds. Repeat __________ times. Complete this exercise __________ times per day.  STRENGTHENING - Abdominals, Crunches   Lie on a firm bed or floor. Keeping your legs in front of you, bend your knees so they are both pointed toward the ceiling and your feet are flat on the   floor. Cross your arms over your chest.  Slightly tip your chin down without bending your neck.  Tense your abdominals and slowly lift your trunk high enough to just clear your shoulder blades. Lifting higher can put excessive stress on the low back and does not further strengthen your abdominal muscles.  Control your return to the starting position. Repeat __________ times. Complete this exercise __________ times per day.  STRENGTHENING - Quadruped, Opposite UE/LE Lift  Assume a hands and knees position on a firm surface. Keep your hands under your shoulders and your knees under your hips. You may place padding under your knees for comfort.  Find your neutral spine and gently tense your abdominal muscles so that you can maintain this position. Your shoulders and hips should form a rectangle  that is parallel with the floor and is not twisted.  Keeping your trunk steady, lift your right hand no higher than your shoulder and then your left leg no higher than your hip. Make sure you are not holding your breath. Hold this position __________ seconds.  Continuing to keep your abdominal muscles tense and your back steady, slowly return to your starting position. Repeat with the opposite arm and leg. Repeat __________ times. Complete this exercise __________ times per day.  STRENGTHENING - Abdominals and Quadriceps, Straight Leg Raise   Lie on a firm bed or floor with both legs extended in front of you.  Keeping one leg in contact with the floor, bend the other knee so that your foot can rest flat on the floor.  Find your neutral spine, and tense your abdominal muscles to maintain your spinal position throughout the exercise.  Slowly lift your straight leg off the floor about 6 inches for a count of 15, making sure to not hold your breath.  Still keeping your neutral spine, slowly lower your leg all the way to the floor. Repeat this exercise with each leg __________ times. Complete this exercise __________ times per day. POSTURE AND BODY MECHANICS CONSIDERATIONS - Sciatica Keeping correct posture when sitting, standing or completing your activities will reduce the stress put on different body tissues, allowing injured tissues a chance to heal and limiting painful experiences. The following are general guidelines for improved posture. Your physician or physical therapist will provide you with any instructions specific to your needs. While reading these guidelines, remember:  The exercises prescribed by your provider will help you have the flexibility and strength to maintain correct postures.  The correct posture provides the optimal environment for your joints to work. All of your joints have less wear and tear when properly supported by a spine with good posture. This means you will  experience a healthier, less painful body.  Correct posture must be practiced with all of your activities, especially prolonged sitting and standing. Correct posture is as important when doing repetitive low-stress activities (typing) as it is when doing a single heavy-load activity (lifting). RESTING POSITIONS Consider which positions are most painful for you when choosing a resting position. If you have pain with flexion-based activities (sitting, bending, stooping, squatting), choose a position that allows you to rest in a less flexed posture. You would want to avoid curling into a fetal position on your side. If your pain worsens with extension-based activities (prolonged standing, working overhead), avoid resting in an extended position such as sleeping on your stomach. Most people will find more comfort when they rest with their spine in a more neutral position, neither too rounded nor too   arched. Lying on a non-sagging bed on your side with a pillow between your knees, or on your back with a pillow under your knees will often provide some relief. Keep in mind, being in any one position for a prolonged period of time, no matter how correct your posture, can still lead to stiffness. PROPER SITTING POSTURE In order to minimize stress and discomfort on your spine, you must sit with correct posture Sitting with good posture should be effortless for a healthy body. Returning to good posture is a gradual process. Many people can work toward this most comfortably by using various supports until they have the flexibility and strength to maintain this posture on their own. When sitting with proper posture, your ears will fall over your shoulders and your shoulders will fall over your hips. You should use the back of the chair to support your upper back. Your low back will be in a neutral position, just slightly arched. You may place a small pillow or folded towel at the base of your low back for support.  When  working at a desk, create an environment that supports good, upright posture. Without extra support, muscles fatigue and lead to excessive strain on joints and other tissues. Keep these recommendations in mind: CHAIR:   A chair should be able to slide under your desk when your back makes contact with the back of the chair. This allows you to work closely.  The chair's height should allow your eyes to be level with the upper part of your monitor and your hands to be slightly lower than your elbows. BODY POSITION  Your feet should make contact with the floor. If this is not possible, use a foot rest.  Keep your ears over your shoulders. This will reduce stress on your neck and low back. INCORRECT SITTING POSTURES   If you are feeling tired and unable to assume a healthy sitting posture, do not slouch or slump. This puts excessive strain on your back tissues, causing more damage and pain. Healthier options include:  Using more support, like a lumbar pillow.  Switching tasks to something that requires you to be upright or walking.  Talking a brief walk.  Lying down to rest in a neutral-spine position. PROLONGED STANDING WHILE SLIGHTLY LEANING FORWARD  When completing a task that requires you to lean forward while standing in one place for a long time, place either foot up on a stationary 2-4 inch high object to help maintain the best posture. When both feet are on the ground, the low back tends to lose its slight inward curve. If this curve flattens (or becomes too large), then the back and your other joints will experience too much stress, fatigue more quickly and can cause pain.  CORRECT STANDING POSTURES Proper standing posture should be assumed with all daily activities, even if they only take a few moments, like when brushing your teeth. As in sitting, your ears should fall over your shoulders and your shoulders should fall over your hips. You should keep a slight tension in your abdominal  muscles to brace your spine. Your tailbone should point down to the ground, not behind your body, resulting in an over-extended swayback posture.  INCORRECT STANDING POSTURES  Common incorrect standing postures include a forward head, locked knees and/or an excessive swayback. WALKING Walk with an upright posture. Your ears, shoulders and hips should all line-up. PROLONGED ACTIVITY IN A FLEXED POSITION When completing a task that requires you to bend forward   at your waist or lean over a low surface, try to find a way to stabilize 3 of 4 of your limbs. You can place a hand or elbow on your thigh or rest a knee on the surface you are reaching across. This will provide you more stability so that your muscles do not fatigue as quickly. By keeping your knees relaxed, or slightly bent, you will also reduce stress across your low back. CORRECT LIFTING TECHNIQUES DO :   Assume a wide stance. This will provide you more stability and the opportunity to get as close as possible to the object which you are lifting.  Tense your abdominals to brace your spine; then bend at the knees and hips. Keeping your back locked in a neutral-spine position, lift using your leg muscles. Lift with your legs, keeping your back straight.  Test the weight of unknown objects before attempting to lift them.  Try to keep your elbows locked down at your sides in order get the best strength from your shoulders when carrying an object.  Always ask for help when lifting heavy or awkward objects. INCORRECT LIFTING TECHNIQUES DO NOT:   Lock your knees when lifting, even if it is a small object.  Bend and twist. Pivot at your feet or move your feet when needing to change directions.  Assume that you cannot safely pick up a paperclip without proper posture.   This information is not intended to replace advice given to you by your health care provider. Make sure you discuss any questions you have with your health care provider.     Document Released: 04/22/2005 Document Revised: 09/06/2014 Document Reviewed: 08/04/2008 Elsevier Interactive Patient Education 2016 [employer]  

## 2015-06-30 LAB — CMP14+EGFR
ALBUMIN: 4 g/dL (ref 3.5–5.5)
ALK PHOS: 103 IU/L (ref 39–117)
ALT: 20 IU/L (ref 0–32)
AST: 15 IU/L (ref 0–40)
Albumin/Globulin Ratio: 1.3 (ref 1.1–2.5)
BUN / CREAT RATIO: 10 (ref 9–23)
BUN: 7 mg/dL (ref 6–24)
Bilirubin Total: <0.2 mg/dL (ref 0.0–1.2)
CALCIUM: 9.6 mg/dL (ref 8.7–10.2)
CO2: 25 mmol/L (ref 18–29)
CREATININE: 0.69 mg/dL (ref 0.57–1.00)
Chloride: 101 mmol/L (ref 96–106)
GFR, EST AFRICAN AMERICAN: 116 mL/min/{1.73_m2} (ref >59)
GFR, EST NON AFRICAN AMERICAN: 100 mL/min/{1.73_m2} (ref >59)
GLOBULIN, TOTAL: 3.1 g/dL (ref 1.5–4.5)
Glucose: 78 mg/dL (ref 65–99)
Potassium: 4.1 mmol/L (ref 3.5–5.2)
SODIUM: 140 mmol/L (ref 134–144)
TOTAL PROTEIN: 7.1 g/dL (ref 6.0–8.5)

## 2015-06-30 LAB — ANEMIA PROFILE B
BASOS: 0 %
Basophils Absolute: 0 10*3/uL (ref 0.0–0.2)
EOS (ABSOLUTE): 0.2 10*3/uL (ref 0.0–0.4)
Eos: 2 %
Ferritin: 20 ng/mL (ref 15–150)
Folate: 8.4 ng/mL (ref >3.0)
Hematocrit: 37.7 % (ref 34.0–46.6)
Hemoglobin: 12.6 g/dL (ref 11.1–15.9)
IMMATURE GRANS (ABS): 0 10*3/uL (ref 0.0–0.1)
Immature Granulocytes: 0 %
Iron Saturation: 20 % (ref 15–55)
Iron: 80 ug/dL (ref 27–159)
LYMPHS ABS: 2.2 10*3/uL (ref 0.7–3.1)
LYMPHS: 24 %
MCH: 29.7 pg (ref 26.6–33.0)
MCHC: 33.4 g/dL (ref 31.5–35.7)
MCV: 89 fL (ref 79–97)
MONOS ABS: 0.6 10*3/uL (ref 0.1–0.9)
Monocytes: 6 %
NEUTROS ABS: 6.4 10*3/uL (ref 1.4–7.0)
Neutrophils: 68 %
PLATELETS: 353 10*3/uL (ref 150–379)
RBC: 4.24 x10E6/uL (ref 3.77–5.28)
RDW: 15.1 % (ref 12.3–15.4)
Retic Ct Pct: 1.8 % (ref 0.6–2.6)
Total Iron Binding Capacity: 400 ug/dL (ref 250–450)
UIBC: 320 ug/dL (ref 131–425)
Vitamin B-12: 230 pg/mL (ref 211–946)
WBC: 9.5 10*3/uL (ref 3.4–10.8)

## 2015-06-30 LAB — THYROID PANEL WITH TSH
Free Thyroxine Index: 1.7 (ref 1.2–4.9)
T3 Uptake Ratio: 25 % (ref 24–39)
T4 TOTAL: 6.9 ug/dL (ref 4.5–12.0)
TSH: 2.41 u[IU]/mL (ref 0.450–4.500)

## 2015-06-30 LAB — LIPID PANEL
CHOL/HDL RATIO: 4.8 ratio — AB (ref 0.0–4.4)
Cholesterol, Total: 189 mg/dL (ref 100–199)
HDL: 39 mg/dL — ABNORMAL LOW (ref >39)
LDL CALC: 88 mg/dL (ref 0–99)
Triglycerides: 311 mg/dL — ABNORMAL HIGH (ref 0–149)
VLDL Cholesterol Cal: 62 mg/dL — ABNORMAL HIGH (ref 5–40)

## 2015-06-30 LAB — VITAMIN D 25 HYDROXY (VIT D DEFICIENCY, FRACTURES): VIT D 25 HYDROXY: 11 ng/mL — AB (ref 30.0–100.0)

## 2015-06-30 LAB — HEPATITIS C ANTIBODY

## 2015-07-05 NOTE — Progress Notes (Signed)
Patient aware.

## 2015-07-13 ENCOUNTER — Ambulatory Visit (HOSPITAL_COMMUNITY)
Admission: RE | Admit: 2015-07-13 | Discharge: 2015-07-13 | Disposition: A | Discharge status: Home / Self Care | Payer: BLUE CROSS/BLUE SHIELD | Source: Ambulatory Visit | Attending: Family | Admitting: Family

## 2015-07-13 DIAGNOSIS — M5441 Lumbago with sciatica, right side: Secondary | ICD-10-CM

## 2015-07-13 MED ORDER — SODIUM CHLORIDE 0.9 % IV SOLN
INTRAVENOUS | Status: AC
  Filled 2015-07-13: qty 50

## 2015-07-26 ENCOUNTER — Telehealth: Payer: Self-pay | Admitting: Family

## 2015-07-26 ENCOUNTER — Ambulatory Visit (INDEPENDENT_AMBULATORY_CARE_PROVIDER_SITE_OTHER): Payer: BLUE CROSS/BLUE SHIELD | Admitting: Family Medicine

## 2015-07-26 ENCOUNTER — Encounter: Payer: Self-pay | Admitting: Family Medicine

## 2015-07-26 VITALS — BP 123/77 | HR 81 | Temp 97.9°F | Ht 62.0 in | Wt 245.0 lb

## 2015-07-26 DIAGNOSIS — J209 Acute bronchitis, unspecified: Secondary | ICD-10-CM | POA: Diagnosis not present

## 2015-07-26 MED ORDER — FLUTICASONE FUROATE-VILANTEROL 200-25 MCG/INH IN AEPB: 1.0000 | INHALATION_SPRAY | Freq: Every day | RESPIRATORY_TRACT | Status: DC

## 2015-07-26 MED ORDER — HYDROCODONE-HOMATROPINE 5-1.5 MG/5ML PO SYRP: 5.0000 mL | ORAL_SOLUTION | Freq: Three times a day (TID) | ORAL | Status: DC | PRN

## 2015-07-26 MED ORDER — AZITHROMYCIN 250 MG PO TABS: ORAL_TABLET | ORAL | Status: DC

## 2015-07-26 NOTE — Telephone Encounter (Signed)
Patient given an appointment today for at 3:30pm.

## 2015-07-26 NOTE — Progress Notes (Signed)
Subjective:    Patient ID: Hayley Crawford, female    DOB: 1965-11-12, 50 y.o.   MRN: [id number]  HPI 50 year old female with cough congestion wheezing chills and fever. Last week she felt as if she had a sinus infection but it seemed to improve but then these new symptoms began over the weekend. She denies any myalgias or sore throat.    Review of Systems  HENT: Positive for congestion.   Respiratory: Positive for cough.   Cardiovascular: Negative.   Gastrointestinal: Negative.   Genitourinary: Negative.   Neurological: Negative.   Psychiatric/Behavioral: Negative.        Patient Active Problem List   Diagnosis Date Noted  . Metabolic syndrome 123456  . Vitamin D deficiency 06/07/2014  . Iron deficiency anemia 06/07/2014  . Tobacco user 06/11/2013  . COPD (chronic obstructive pulmonary disease) ([place])   . Aches 05/24/2013  . Hypertension   . GAD (generalized anxiety disorder) 08/27/2012  . Depression 08/27/2012  . GERD (gastroesophageal reflux disease) 08/27/2012  . Hyperlipidemia 08/27/2012  . Vitamin B 12 deficiency 08/27/2012   Outpatient Encounter Prescriptions as of 07/26/2015  Medication Sig  . albuterol (PROVENTIL HFA;VENTOLIN HFA) 108 (90 BASE) MCG/ACT inhaler Inhale 2 puffs into the lungs every 6 (six) hours as needed for wheezing or shortness of breath.  Marland Kitchen albuterol (PROVENTIL) (2.5 MG/3ML) 0.083% nebulizer solution Take 3 mLs (2.5 mg total) by nebulization every 6 (six) hours as needed for wheezing or shortness of breath.  Marland Kitchen atorvastatin (LIPITOR) 40 MG tablet Take 1 tablet (40 mg total) by mouth daily.  . busPIRone (BUSPAR) 10 MG tablet Take 1 tablet (10 mg total) by mouth 3 (three) times daily.  . cyclobenzaprine (FLEXERIL) 10 MG tablet Take 10 mg by mouth 3 (three) times daily as needed for muscle spasms.  . Fluticasone Furoate-Vilanterol (BREO ELLIPTA) 100-25 MCG/INH AEPB Inhale 1 Inhaler into the lungs 1 day or 1 dose.  . ibuprofen (ADVIL,MOTRIN) 600 MG  tablet Take 1 tablet (600 mg total) by mouth every 8 (eight) hours as needed.  Marland Kitchen losartan-hydrochlorothiazide (HYZAAR) 100-25 MG tablet Take 1 tablet by mouth daily.  Marland Kitchen omeprazole (PRILOSEC) 20 MG capsule Take 1 capsule (20 mg total) by mouth daily.  . promethazine (PHENERGAN) 12.5 MG tablet Take 1 tablet (12.5 mg total) by mouth every 8 (eight) hours as needed for nausea or vomiting.  . varenicline (CHANTIX STARTING MONTH PAK) 0.5 MG X 11 & 1 MG X 42 tablet Take one 0.5 mg tablet by mouth once daily for 3 days, then increase to one 0.5 mg tablet twice daily for 4 days, then increase to one 1 mg tablet twice daily.  . Vitamin D, Ergocalciferol, (DRISDOL) 50000 UNITS CAPS capsule Take 1 capsule (50,000 Units total) by mouth every 7 (seven) days.  Marland Kitchen azithromycin (ZITHROMAX) 250 MG tablet 2 tabs today, 1 tablet next 4 days  . fluticasone furoate-vilanterol (BREO ELLIPTA) 200-25 MCG/INH AEPB Inhale 1 puff into the lungs daily.  Marland Kitchen HYDROcodone-homatropine (HYCODAN) 5-1.5 MG/5ML syrup Take 5 mLs by mouth every 8 (eight) hours as needed for cough.  . [DISCONTINUED] predniSONE (STERAPRED UNI-PAK 21 TAB) 10 MG (21) TBPK tablet Take 1 tablet (10 mg total) by mouth daily. As directed x 6 days   No facility-administered encounter medications on file as of 07/26/2015.    Objective:   Physical Exam  Constitutional: She is oriented to person, place, and time. She appears well-developed and well-nourished.  HENT:  Mouth/Throat: Oropharynx is clear and moist.  Cardiovascular: Normal rate, regular rhythm and normal heart sounds.   Pulmonary/Chest: Effort normal. She has wheezes.  Neurological: She is alert and oriented to person, place, and time.          Assessment & Plan:  1. Acute bronchitis, unspecified organism Asian has no history of asthma/suspect the wheezing is related to bronchitis. It may have followed sinus infection last week according to her history. Will treat with a Z-Pak Mucinex DM for  daytime Hycodan for nighttime rest and cough suppression. Drink lots of fluids to get plenty of rest let us know if symptoms do not resolve  Wardell Honour MD

## 2015-09-29 ENCOUNTER — Ambulatory Visit: Payer: BLUE CROSS/BLUE SHIELD | Admitting: Family

## 2015-09-29 ENCOUNTER — Ambulatory Visit (INDEPENDENT_AMBULATORY_CARE_PROVIDER_SITE_OTHER): Payer: BLUE CROSS/BLUE SHIELD | Admitting: Family

## 2015-09-29 ENCOUNTER — Encounter: Payer: Self-pay | Admitting: Family

## 2015-09-29 VITALS — BP 119/72 | HR 85 | Temp 98.4°F | Ht 62.0 in | Wt 239.0 lb

## 2015-09-29 DIAGNOSIS — K219 Gastro-esophageal reflux disease without esophagitis: Secondary | ICD-10-CM

## 2015-09-29 DIAGNOSIS — E559 Vitamin D deficiency, unspecified: Secondary | ICD-10-CM | POA: Diagnosis not present

## 2015-09-29 DIAGNOSIS — E8881 Metabolic syndrome: Secondary | ICD-10-CM

## 2015-09-29 DIAGNOSIS — I10 Essential (primary) hypertension: Secondary | ICD-10-CM | POA: Diagnosis not present

## 2015-09-29 DIAGNOSIS — E538 Deficiency of other specified B group vitamins: Secondary | ICD-10-CM | POA: Diagnosis not present

## 2015-09-29 DIAGNOSIS — F32A Depression, unspecified: Secondary | ICD-10-CM

## 2015-09-29 DIAGNOSIS — J449 Chronic obstructive pulmonary disease, unspecified: Secondary | ICD-10-CM

## 2015-09-29 DIAGNOSIS — F411 Generalized anxiety disorder: Secondary | ICD-10-CM | POA: Diagnosis not present

## 2015-09-29 DIAGNOSIS — D509 Iron deficiency anemia, unspecified: Secondary | ICD-10-CM

## 2015-09-29 DIAGNOSIS — E785 Hyperlipidemia, unspecified: Secondary | ICD-10-CM

## 2015-09-29 DIAGNOSIS — Z720 Tobacco use: Secondary | ICD-10-CM

## 2015-09-29 DIAGNOSIS — Z6841 Body Mass Index (BMI) 40.0 and over, adult: Secondary | ICD-10-CM

## 2015-09-29 DIAGNOSIS — F329 Major depressive disorder, single episode, unspecified: Secondary | ICD-10-CM | POA: Diagnosis not present

## 2015-09-29 NOTE — Patient Instructions (Signed)
Eczema Eczema, also called atopic dermatitis, is a skin disorder that causes inflammation of the skin. It causes a red rash and dry, scaly skin. The skin becomes very itchy. Eczema is generally worse during the cooler winter months and often improves with the warmth of summer. Eczema usually starts showing signs in infancy. Some children outgrow eczema, but it may last through adulthood.  CAUSES  The exact cause of eczema is not known, but it appears to run in families. People with eczema often have a family history of eczema, allergies, asthma, or hay fever. Eczema is not contagious. Flare-ups of the condition may be caused by:   Contact with something you are sensitive or allergic to.   Stress. SIGNS AND SYMPTOMS  Dry, scaly skin.   Red, itchy rash.   Itchiness. This may occur before the skin rash and may be very intense.  DIAGNOSIS  The diagnosis of eczema is usually made based on symptoms and medical history. TREATMENT  Eczema cannot be cured, but symptoms usually can be controlled with treatment and other strategies. A treatment plan might include:  Controlling the itching and scratching.   Use over-the-counter antihistamines as directed for itching. This is especially useful at night when the itching tends to be worse.   Use over-the-counter steroid creams as directed for itching.   Avoid scratching. Scratching makes the rash and itching worse. It may also result in a skin infection (impetigo) due to a break in the skin caused by scratching.   Keeping the skin well moisturized with creams every day. This will seal in moisture and help prevent dryness. Lotions that contain alcohol and water should be avoided because they can dry the skin.   Limiting exposure to things that you are sensitive or allergic to (allergens).   Recognizing situations that cause stress.   Developing a plan to manage stress.  HOME CARE INSTRUCTIONS   Only take over-the-counter or  prescription medicines as directed by your health care provider.   Do not use anything on the skin without checking with your health care provider.   Keep baths or showers short (5 minutes) in warm (not hot) water. Use mild cleansers for bathing. These should be unscented. You may add nonperfumed bath oil to the bath water. It is best to avoid soap and bubble bath.   Immediately after a bath or shower, when the skin is still damp, apply a moisturizing ointment to the entire body. This ointment should be a petroleum ointment. This will seal in moisture and help prevent dryness. The thicker the ointment, the better. These should be unscented.   Keep fingernails cut short. Children with eczema may need to wear soft gloves or mittens at night after applying an ointment.   Dress in clothes made of cotton or cotton blends. Dress lightly, because heat increases itching.   A child with eczema should stay away from anyone with fever blisters or cold sores. The virus that causes fever blisters (herpes simplex) can cause a serious skin infection in children with eczema. SEEK MEDICAL CARE IF:   Your itching interferes with sleep.   Your rash gets worse or is not better within 1 week after starting treatment.   You see pus or soft yellow scabs in the rash area.   You have a fever.   You have a rash flare-up after contact with someone who has fever blisters.    This information is not intended to replace advice given to you by your health care   provider. Make sure you discuss any questions you have with your health care provider.   Document Released: 04/19/2000 Document Revised: 02/10/2013 Document Reviewed: 11/23/2012 Elsevier Interactive Patient Education 2016 Elsevier Inc.  

## 2015-09-29 NOTE — Progress Notes (Signed)
Subjective:    Patient ID: Hayley Crawford, female    DOB: [birth year], 50 y.o.   MRN: [id number]  Pt presents to the office today for chronic follow up.  Hypertension This is a chronic problem. The current episode started more than 1 year ago. The problem has been resolved since onset. The problem is controlled. Associated symptoms include anxiety and malaise/fatigue. Pertinent negatives include no blurred vision, chest pain, headaches, palpitations or shortness of breath. Peripheral edema: "trace amount in ankles" Risk factors for coronary artery disease include dyslipidemia, obesity, post-menopausal state, family history and smoking/tobacco exposure. Past treatments include diuretics and angiotensin blockers. The current treatment provides significant improvement. There is no history of kidney disease, CAD/MI, CVA, heart failure or a thyroid problem. Identifiable causes of hypertension include sleep apnea.  Anxiety Presents for follow-up visit. Onset was 1 to 6 months ago. Symptoms include excessive worry and nervous/anxious behavior. Patient reports no chest pain, decreased concentration, depressed mood, insomnia, palpitations, panic or shortness of breath. Symptoms occur occasionally.   Her past medical history is significant for anemia and anxiety/panic attacks. There is no history of depression. Past treatments include non-benzodiazephine anxiolytics. The treatment provided moderate relief. Compliance with prior treatments has been good.  Gastroesophageal Reflux She reports no chest pain, no coughing, no heartburn, no sore throat or no wheezing. This is a chronic problem. The current episode started more than 1 year ago. The problem occurs rarely. The symptoms are aggravated by certain foods. Pertinent negatives include no fatigue or muscle weakness. She has tried a PPI for the symptoms. The treatment provided significant relief.  Hyperlipidemia This is a chronic problem. The current episode  started more than 1 year ago. The problem is controlled. Recent lipid tests were reviewed and are normal. Exacerbating diseases include obesity. She has no history of diabetes or hypothyroidism. Factors aggravating her hyperlipidemia include smoking. Pertinent negatives include no chest pain or shortness of breath. Current antihyperlipidemic treatment includes statins. The current treatment provides no improvement of lipids. Compliance problems include adherence to diet.  Risk factors for coronary artery disease include dyslipidemia, family history, hypertension and a sedentary lifestyle.  Anemia Presents for follow-up visit. Symptoms include malaise/fatigue. There has been no bruising/bleeding easily or palpitations. Past treatments include changes in diet. There is no history of heart failure or hypothyroidism. Family history includes iron deficiency.  COPD PT currently taking Breo daily and use albuterol as needed ("I usually take it before I going walking") . PT currently smoking 1 1/2 packs a day. PT states she is trying to "cut back"    Review of Systems  Constitutional: Positive for malaise/fatigue. Negative for fatigue.  HENT: Negative.  Negative for sore throat.   Eyes: Negative.  Negative for blurred vision.  Respiratory: Negative.  Negative for cough, shortness of breath and wheezing.   Cardiovascular: Negative for chest pain and palpitations.  Gastrointestinal: Negative.  Negative for heartburn.  Endocrine: Negative.   Genitourinary: Negative.   Musculoskeletal: Negative.  Negative for muscle weakness.  Neurological: Negative.  Negative for headaches.  Hematological: Negative.  Does not bruise/bleed easily.  Psychiatric/Behavioral: Negative for decreased concentration. The patient is nervous/anxious. The patient does not have insomnia.   All other systems reviewed and are negative.      Objective:   Physical Exam  Constitutional: She is oriented to person, place, and time. She  appears well-developed and well-nourished. No distress.  HENT:  Head: Normocephalic and atraumatic.  Right Ear: External ear normal.  Left  Ear: External ear normal.  Nose: Nose normal.  Mouth/Throat: Oropharynx is clear and moist.  Eyes: Pupils are equal, round, and reactive to light.  Neck: Normal range of motion. Neck supple. No thyromegaly present.  Cardiovascular: Normal rate, regular rhythm, normal heart sounds and intact distal pulses.   No murmur heard. Pulmonary/Chest: Effort normal and breath sounds normal. No respiratory distress. She has no wheezes.  Abdominal: Soft. Bowel sounds are normal. She exhibits no distension. There is no tenderness.  Musculoskeletal: Normal range of motion. She exhibits no edema or tenderness.  Neurological: She is alert and oriented to person, place, and time. She has normal reflexes. No cranial nerve deficit.  Skin: Skin is warm and dry.  Psychiatric: She has a normal mood and affect. Her behavior is normal. Judgment and thought content normal.  Vitals reviewed.   BP 119/72 mmHg  Pulse 85  Temp(Src) 98.4 F (36.9 C) (Oral)  Ht 5\' 2"  (1.575 m)  Wt 239 lb (108.41 kg)  BMI 43.70 kg/m2  LMP  (Approximate)       Assessment & Plan:  1. Essential hypertension  2. Chronic obstructive pulmonary disease, unspecified COPD type ([place])  3. Gastroesophageal reflux disease, esophagitis presence not specified  4. Vitamin B 12 deficien  5. GAD (generalized anxiety disorder)   6. Depression  7. Hyperlipidemia  8. Tobacco user  9. Vitamin D deficiency  10. Iron deficiency anemia   11. Metabolic syndrome  12. Morbid obesity with BMI of 40.0-44.9, adult ([place])   Continue all meds PT would like to hold off on lab work until next visit Health Maintenance reviewed Diet and exercise encouraged RTO 3 months  [name], FNP

## 2016-01-17 ENCOUNTER — Encounter: Payer: Self-pay | Admitting: Physician Assistant

## 2016-01-17 ENCOUNTER — Ambulatory Visit (INDEPENDENT_AMBULATORY_CARE_PROVIDER_SITE_OTHER): Payer: BLUE CROSS/BLUE SHIELD | Admitting: Physician Assistant

## 2016-01-17 VITALS — BP 122/81 | HR 93 | Temp 99.3°F | Ht 62.0 in | Wt 239.2 lb

## 2016-01-17 DIAGNOSIS — S29012A Strain of muscle and tendon of back wall of thorax, initial encounter: Secondary | ICD-10-CM | POA: Diagnosis not present

## 2016-01-17 DIAGNOSIS — S233XXA Sprain of ligaments of thoracic spine, initial encounter: Secondary | ICD-10-CM

## 2016-01-17 MED ORDER — CYCLOBENZAPRINE HCL 10 MG PO TABS: 10.0000 mg | ORAL_TABLET | Freq: Three times a day (TID) | ORAL | 1 refills | Status: DC | PRN

## 2016-01-17 NOTE — Patient Instructions (Signed)
Thoracic Strain A thoracic strain, which is sometimes called a mid-back strain, is an injury to the muscles or tendons that attach to the upper part of your back behind your chest. This type of injury occurs when a muscle is overstretched or overloaded.  Thoracic strains can range from mild to severe. Mild strains may involve stretching a muscle or tendon without tearing it. These injuries may heal in 1-2 weeks. More severe strains involve tearing of muscle fibers or tendons. These will cause more pain and may take 6-8 weeks to heal. CAUSES This condition may be caused by:  An injury in which a sudden force is placed on the muscle.  Exercising without properly warming up.  Overuse of the muscle.  Improper form during certain movements.  Other injuries that surround or cause stress on the mid-back, causing a strain on the muscles. In some cases, the cause may not be known. RISK FACTORS This injury is more common in:  Athletes.  People with obesity. SYMPTOMS The main symptom of this condition is pain, especially with movement. Other symptoms include:  Bruising.  Swelling.  Spasm. DIAGNOSIS This condition may be diagnosed with a physical exam. X-rays may be taken to check for a fracture. TREATMENT This condition may be treated with:  Resting and icing the injured area.  Physical therapy. This will involve doing stretching and strengthening exercises.  Medicines for pain and inflammation. HOME CARE INSTRUCTIONS  Rest as needed. Follow instructions from your health care provider about any restrictions on activity.  If directed, apply ice to the injured area:  Put ice in a plastic bag.  Place a towel between your skin and the bag.  Leave the ice on for 20 minutes, 2-3 times per day.  Take over-the-counter and prescription medicines only as told by your health care provider.  Begin doing exercises as told by your health care provider or physical therapist.  Always  warm up properly before physical activity or sports.  Bend your knees before you lift heavy objects.  Keep all follow-up visits as told by your health care provider. This is important. SEEK MEDICAL CARE IF:  Your pain is not helped by medicine.  Your pain, bruising, or swelling is getting worse.  You have a fever. SEEK IMMEDIATE MEDICAL CARE IF:  You have shortness of breath.  You have chest pain.  You develop numbness or weakness in your legs.  You have involuntary loss of urine (urinary incontinence).   This information is not intended to replace advice given to you by your health care provider. Make sure you discuss any questions you have with your health care provider.   Document Released: 07/13/2003 Document Revised: 01/11/2015 Document Reviewed: 06/16/2014 Elsevier Interactive Patient Education 2016 Elsevier Inc.  

## 2016-01-17 NOTE — Progress Notes (Signed)
BP 122/81 (BP Location: Right Arm, Patient Position: Sitting, Cuff Size: Large)   Pulse 93   Temp 99.3 F (37.4 C) (Oral)   Ht 5\' 2"  (1.575 m)   Wt 239 lb 3.2 oz (108.5 kg)   LMP  (Approximate)   BMI 43.75 kg/m    Subjective:    Patient ID: Hayley Crawford, female    DOB: [birth year], 50 y.o.   MRN: [id number]  HPI: Hayley Crawford is a 50 y.o. female presenting on 01/17/2016 for Back Pain (Middle of back ) This patient states that she has had a significant amount of back pain in the lower thoracic area more pronounced on the left side than the right. She denies any weakness or numbness. The pain is increased with movement and lifting. She does work in a [employer]. She has to lift boxes on her own in the 60+ pound range. The heaviest boxes will be is around 130 pounds. She does get help with the very heavy once. She states that she does have to use hand trucks and moves things around on shelves. She has a coworker that has been out she has been doing more lifting than normal. At home she had some Flexeril and Tylenol 3 from a surgery. She has been taking it without complete relief. She states she has not been taking her meloxicam which she does have some at home. I have asked her to make sure she has meloxicam 7.5 mg at home and if she does to take 2 daily for about a week. She does need a refill on the Flexeril. She has not been trying any type of heat or stretching. Given her instructions on 15 minutes of heat and then work on stretching of this area. We will have her out of work for the next 3 days. There are no other complaints at this time.  Relevant past medical, surgical, family and social history reviewed and updated as indicated. Interim medical history since our last visit reviewed. Allergies and medications reviewed and updated. DATA REVIEWED: CHART IN EPIC  Review of Systems  Constitutional: Negative.  Negative for activity change, fatigue and fever.  Eyes: Negative.   Respiratory:  Negative.  Negative for cough.   Cardiovascular: Negative.  Negative for chest pain.  Gastrointestinal: Negative.  Negative for abdominal pain.  Genitourinary: Negative.  Negative for dysuria.  Musculoskeletal: Positive for back pain and myalgias.  Skin: Negative.   Neurological: Negative.     Per HPI unless specifically indicated above     Medication List       Accurate as of 01/17/16  9:43 AM. Always use your most recent med list.          acetaminophen-codeine 300-30 MG tablet Commonly known as:  TYLENOL #3 Take by mouth every 4 (four) hours as needed for moderate pain.   albuterol (2.5 MG/3ML) 0.083% nebulizer solution Commonly known as:  PROVENTIL Take 3 mLs (2.5 mg total) by nebulization every 6 (six) hours as needed for wheezing or shortness of breath.   albuterol 108 (90 Base) MCG/ACT inhaler Commonly known as:  PROVENTIL HFA;VENTOLIN HFA Inhale 2 puffs into the lungs every 6 (six) hours as needed for wheezing or shortness of breath.   busPIRone 10 MG tablet Commonly known as:  BUSPAR Take 1 tablet (10 mg total) by mouth 3 (three) times daily.   cyclobenzaprine 10 MG tablet Commonly known as:  FLEXERIL Take 1 tablet (10 mg total) by mouth 3 (three) times daily  as needed for muscle spasms.   fluticasone furoate-vilanterol 200-25 MCG/INH Aepb Commonly known as:  BREO ELLIPTA Inhale 1 puff into the lungs daily.   ibuprofen 600 MG tablet Commonly known as:  ADVIL,MOTRIN Take 1 tablet (600 mg total) by mouth every 8 (eight) hours as needed.   losartan-hydrochlorothiazide 100-25 MG tablet Commonly known as:  HYZAAR Take 1 tablet by mouth daily.   omeprazole 20 MG capsule Commonly known as:  PRILOSEC Take 1 capsule (20 mg total) by mouth daily.   promethazine 12.5 MG tablet Commonly known as:  PHENERGAN Take 1 tablet (12.5 mg total) by mouth every 8 (eight) hours as needed for nausea or vomiting.   Vitamin D (Ergocalciferol) 50000 units Caps  capsule Commonly known as:  DRISDOL Take 1 capsule (50,000 Units total) by mouth every 7 (seven) days.          Objective:    BP 122/81 (BP Location: Right Arm, Patient Position: Sitting, Cuff Size: Large)   Pulse 93   Temp 99.3 F (37.4 C) (Oral)   Ht 5\' 2"  (1.575 m)   Wt 239 lb 3.2 oz (108.5 kg)   LMP  (Approximate)   BMI 43.75 kg/m   Allergies  Allergen Reactions  . Sulfa Antibiotics Swelling    "think it caused my throat to swell"    Wt Readings from Last 3 Encounters:  01/17/16 239 lb 3.2 oz (108.5 kg)  09/29/15 239 lb (108.4 kg)  07/26/15 245 lb (111.1 kg)    Physical Exam  Constitutional: She is oriented to person, place, and time. She appears well-developed and well-nourished.  HENT:  Head: Normocephalic and atraumatic.  Eyes: Conjunctivae and EOM are normal. Pupils are equal, round, and reactive to light.  Cardiovascular: Normal rate, regular rhythm, normal heart sounds and intact distal pulses.   Pulmonary/Chest: Effort normal and breath sounds normal.  Abdominal: Soft. Bowel sounds are normal.  Musculoskeletal:       Thoracic back: She exhibits decreased range of motion, tenderness, swelling, pain and spasm.       Back:  Neurological: She is alert and oriented to person, place, and time. She has normal reflexes.  Skin: Skin is warm and dry. No rash noted.  Psychiatric: She has a normal mood and affect. Her behavior is normal. Judgment and thought content normal.  Nursing note and vitals reviewed.      Assessment & Plan:   1. Thoracic sprain and strain, initial encounter Mobic 15 mg daily, has at home. Refill flexeril 10 mg 1 TID spasms Heat 15 minutes and stretch Note for out of work 9/13-9/15  Continue all other maintenance medications as listed above.  Follow up plan: PRN worsening of symptoms    Educational handout given for thoracic strain.  Terald Sleeper PA-C [facility] [address]  [place], [place]  [address] [phone]   01/17/2016, 9:43 AM

## 2016-02-08 ENCOUNTER — Other Ambulatory Visit: Payer: Self-pay | Admitting: Family

## 2016-02-08 DIAGNOSIS — M5441 Lumbago with sciatica, right side: Secondary | ICD-10-CM

## 2016-04-19 ENCOUNTER — Other Ambulatory Visit: Payer: Self-pay | Admitting: Family

## 2016-04-19 DIAGNOSIS — M5441 Lumbago with sciatica, right side: Secondary | ICD-10-CM

## 2016-05-17 ENCOUNTER — Other Ambulatory Visit: Payer: Self-pay | Admitting: Family

## 2016-05-17 DIAGNOSIS — K219 Gastro-esophageal reflux disease without esophagitis: Secondary | ICD-10-CM

## 2016-05-27 ENCOUNTER — Encounter: Payer: Self-pay | Admitting: Pediatrics

## 2016-05-27 ENCOUNTER — Ambulatory Visit (INDEPENDENT_AMBULATORY_CARE_PROVIDER_SITE_OTHER): Payer: BLUE CROSS/BLUE SHIELD | Admitting: Pediatrics

## 2016-05-27 ENCOUNTER — Ambulatory Visit (INDEPENDENT_AMBULATORY_CARE_PROVIDER_SITE_OTHER): Payer: BLUE CROSS/BLUE SHIELD

## 2016-05-27 VITALS — BP 150/86 | HR 70 | Temp 97.0°F | Ht 62.0 in | Wt 245.0 lb

## 2016-05-27 DIAGNOSIS — J069 Acute upper respiratory infection, unspecified: Secondary | ICD-10-CM | POA: Diagnosis not present

## 2016-05-27 DIAGNOSIS — I10 Essential (primary) hypertension: Secondary | ICD-10-CM

## 2016-05-27 DIAGNOSIS — M545 Low back pain: Secondary | ICD-10-CM

## 2016-05-27 NOTE — Patient Instructions (Addendum)
Netipot with distilled water 2-3 times a day to clear out sinuses Or Normal saline nasal spray Flonase steroid nasal spray Antihistamine daily such as cetirizine Lots of fluids   Back Exercises Introduction If you have pain in your back, do these exercises 2-3 times each day or as told by your doctor. When the pain goes away, do the exercises once each day, but repeat the steps more times for each exercise (do more repetitions). If you do not have pain in your back, do these exercises once each day or as told by your doctor. Exercises Single Knee to Chest  Do these steps 3-5 times in a row for each leg: 1. Lie on your back on a firm bed or the floor with your legs stretched out. 2. Bring one knee to your chest. 3. Hold your knee to your chest by grabbing your knee or thigh. 4. Pull on your knee until you feel a gentle stretch in your lower back. 5. Keep doing the stretch for 10-30 seconds. 6. Slowly let go of your leg and straighten it. Pelvic Tilt  Do these steps 5-10 times in a row: 1. Lie on your back on a firm bed or the floor with your legs stretched out. 2. Bend your knees so they point up to the ceiling. Your feet should be flat on the floor. 3. Tighten your lower belly (abdomen) muscles to press your lower back against the floor. This will make your tailbone point up to the ceiling instead of pointing down to your feet or the floor. 4. Stay in this position for 5-10 seconds while you gently tighten your muscles and breathe evenly. Cat-Cow  Do these steps until your lower back bends more easily: 1. Get on your hands and knees on a firm surface. Keep your hands under your shoulders, and keep your knees under your hips. You may put padding under your knees. 2. Let your head hang down, and make your tailbone point down to the floor so your lower back is round like the back of a cat. 3. Stay in this position for 5 seconds. 4. Slowly lift your head and make your tailbone point up to  the ceiling so your back hangs low (sags) like the back of a cow. 5. Stay in this position for 5 seconds. Press-Ups  Do these steps 5-10 times in a row: 1. Lie on your belly (face-down) on the floor. 2. Place your hands near your head, about shoulder-width apart. 3. While you keep your back relaxed and keep your hips on the floor, slowly straighten your arms to raise the top half of your body and lift your shoulders. Do not use your back muscles. To make yourself more comfortable, you may change where you place your hands. 4. Stay in this position for 5 seconds. 5. Slowly return to lying flat on the floor. Bridges  Do these steps 10 times in a row: 1. Lie on your back on a firm surface. 2. Bend your knees so they point up to the ceiling. Your feet should be flat on the floor. 3. Tighten your butt muscles and lift your butt off of the floor until your waist is almost as high as your knees. If you do not feel the muscles working in your butt and the back of your thighs, slide your feet 1-2 inches farther away from your butt. 4. Stay in this position for 3-5 seconds. 5. Slowly lower your butt to the floor, and let your butt  muscles relax. If this exercise is too easy, try doing it with your arms crossed over your chest. Belly Crunches  Do these steps 5-10 times in a row: 1. Lie on your back on a firm bed or the floor with your legs stretched out. 2. Bend your knees so they point up to the ceiling. Your feet should be flat on the floor. 3. Cross your arms over your chest. 4. Tip your chin a little bit toward your chest but do not bend your neck. 5. Tighten your belly muscles and slowly raise your chest just enough to lift your shoulder blades a tiny bit off of the floor. 6. Slowly lower your chest and your head to the floor. Back Lifts  Do these steps 5-10 times in a row: 1. Lie on your belly (face-down) with your arms at your sides, and rest your forehead on the floor. 2. Tighten the muscles  in your legs and your butt. 3. Slowly lift your chest off of the floor while you keep your hips on the floor. Keep the back of your head in line with the curve in your back. Look at the floor while you do this. 4. Stay in this position for 3-5 seconds. 5. Slowly lower your chest and your face to the floor. Contact a doctor if:  Your back pain gets a lot worse when you do an exercise.  Your back pain does not lessen 2 hours after you exercise. If you have any of these problems, stop doing the exercises. Do not do them again unless your doctor says it is okay. Get help right away if:  You have sudden, very bad back pain. If this happens, stop doing the exercises. Do not do them again unless your doctor says it is okay. This information is not intended to replace advice given to you by your health care provider. Make sure you discuss any questions you have with your health care provider. Document Released: 05/25/2010 Document Revised: 09/28/2015 Document Reviewed: 06/16/2014  2017 Elsevier

## 2016-05-27 NOTE — Progress Notes (Signed)
  Subjective:   Patient ID: Hayley Crawford, female    DOB: June 14, 1965, 51 y.o.   MRN: [id number] CC: Back Pain (fall on 05/23/16); dry cough; and Hoarse  HPI: Hayley Crawford is a 51 y.o. female presenting for Back Pain (fall on 05/23/16); dry cough; and Hoarse  Has been sick for past 2 days Ears have been hurting Coughing some Some sinus pressure No fevers Appetite is fine, drinking fluids well Has been taking mucinex  Thinks that is why her BP is up today Doesn't check at home, can check at work Taking meds regularly No CP, no SOB  Back hurts all the time since slipping on the ice, fell on R side from standing, tripped on dog bowl under the snow, but now L side hurts, L arm hurts as well Sitting up makes it worse Has sciatica sometimes, not now Feels like a constant dull ache Hurt more the next day, able to get up and walk after incident Has been taking muscle relaxers Taking ibuprofen Not used heating pad  Relevant past medical, surgical, family and social history reviewed. Allergies and medications reviewed and updated. History  Smoking Status  . Current Every Day Smoker  Smokeless Tobacco  . Never Used   ROS: Per HPI   Objective:    BP (!) 150/86   Pulse 70   Temp 97 F (36.1 C) (Oral)   Ht 5\' 2"  (1.575 m)   Wt 245 lb (111.1 kg)   LMP 05/31/2014   BMI 44.81 kg/m   Wt Readings from Last 3 Encounters:  05/27/16 245 lb (111.1 kg)  01/17/16 239 lb 3.2 oz (108.5 kg)  09/29/15 239 lb (108.4 kg)    Gen: NAD, alert, cooperative with exam, NCAT EYES: EOMI, no conjunctival injection, or no icterus ENT:  TMs dull gray b/l, OP without erythema, no ttp over sinuses LYMPH: no cervical LAD CV: NRRR, normal S1/S2, no murmur, distal pulses 2+ b/l Resp: CTABL, no wheezes, normal WOB Abd: +BS, soft, NTND. no guarding or organomegaly Ext: No edema, warm Neuro: Alert and oriented, strength equal b/l UE and LE, sensation intact b/l LE, patellar reflex 2+ b/l MSK: no TTP over  spine, TTP mid back paraspinal muscles. Can flex spine, twist spine with minimal pain Skin: no bruising over L side  Lower back xray: Preliminary read by Assunta Found, MD:  Normal vertebral bodies, no acute fracture  Assessment & Plan:  Hayley Crawford was seen today for back pain, dry cough and hoarse.  Diagnoses and all orders for this visit:  Low back pain, unspecified back pain laterality, unspecified chronicity, with sciatica presence unspecified No acute fracture, will f/u final read No red flag symptoms NSAIDs, rest, gentle back exercises Can take flexeril as needed, do not drive after taking -     DG Lumbar Spine 2-3 Views; Future  Acute URI Discussed symptomatic care Sick for two days, no indication for antibiotics  Essential hypertension Elevated today Check at work Let me know if remains elevated Cont meds  Follow up plan: Return if symptoms worsen or fail to improve. Assunta Found, MD [place]

## 2016-06-18 ENCOUNTER — Other Ambulatory Visit: Payer: Self-pay | Admitting: Family Medicine

## 2016-06-18 DIAGNOSIS — Z1231 Encounter for screening mammogram for malignant neoplasm of breast: Secondary | ICD-10-CM

## 2016-07-04 ENCOUNTER — Ambulatory Visit (HOSPITAL_COMMUNITY)
Admission: RE | Admit: 2016-07-04 | Discharge: 2016-07-04 | Disposition: A | Discharge status: Home / Self Care | Payer: BLUE CROSS/BLUE SHIELD | Source: Ambulatory Visit | Attending: Family Medicine | Admitting: Family Medicine

## 2016-07-04 DIAGNOSIS — Z1231 Encounter for screening mammogram for malignant neoplasm of breast: Secondary | ICD-10-CM | POA: Insufficient documentation

## 2016-07-05 ENCOUNTER — Encounter: Payer: Self-pay | Admitting: Family

## 2016-07-05 ENCOUNTER — Ambulatory Visit (INDEPENDENT_AMBULATORY_CARE_PROVIDER_SITE_OTHER): Payer: BLUE CROSS/BLUE SHIELD | Admitting: Family

## 2016-07-05 VITALS — BP 116/80 | HR 81 | Temp 98.0°F | Ht 62.0 in | Wt 243.4 lb

## 2016-07-05 DIAGNOSIS — I10 Essential (primary) hypertension: Secondary | ICD-10-CM | POA: Diagnosis not present

## 2016-07-05 DIAGNOSIS — Z720 Tobacco use: Secondary | ICD-10-CM

## 2016-07-05 DIAGNOSIS — Z0000 Encounter for general adult medical examination without abnormal findings: Secondary | ICD-10-CM

## 2016-07-05 DIAGNOSIS — J449 Chronic obstructive pulmonary disease, unspecified: Secondary | ICD-10-CM

## 2016-07-05 DIAGNOSIS — E559 Vitamin D deficiency, unspecified: Secondary | ICD-10-CM | POA: Diagnosis not present

## 2016-07-05 DIAGNOSIS — K219 Gastro-esophageal reflux disease without esophagitis: Secondary | ICD-10-CM

## 2016-07-05 DIAGNOSIS — J441 Chronic obstructive pulmonary disease with (acute) exacerbation: Secondary | ICD-10-CM

## 2016-07-05 DIAGNOSIS — D509 Iron deficiency anemia, unspecified: Secondary | ICD-10-CM

## 2016-07-05 DIAGNOSIS — F411 Generalized anxiety disorder: Secondary | ICD-10-CM

## 2016-07-05 DIAGNOSIS — E785 Hyperlipidemia, unspecified: Secondary | ICD-10-CM

## 2016-07-05 DIAGNOSIS — E538 Deficiency of other specified B group vitamins: Secondary | ICD-10-CM | POA: Diagnosis not present

## 2016-07-05 DIAGNOSIS — F331 Major depressive disorder, recurrent, moderate: Secondary | ICD-10-CM | POA: Diagnosis not present

## 2016-07-05 DIAGNOSIS — Z6841 Body Mass Index (BMI) 40.0 and over, adult: Secondary | ICD-10-CM

## 2016-07-05 DIAGNOSIS — E8881 Metabolic syndrome: Secondary | ICD-10-CM

## 2016-07-05 DIAGNOSIS — L719 Rosacea, unspecified: Secondary | ICD-10-CM

## 2016-07-05 MED ORDER — OMEPRAZOLE 20 MG PO CPDR: 20.0000 mg | DELAYED_RELEASE_CAPSULE | Freq: Every day | ORAL | 2 refills | Status: DC

## 2016-07-05 MED ORDER — BUDESONIDE-FORMOTEROL FUMARATE 80-4.5 MCG/ACT IN AERO: 2.0000 | INHALATION_SPRAY | Freq: Two times a day (BID) | RESPIRATORY_TRACT | 3 refills | Status: DC

## 2016-07-05 MED ORDER — BUSPIRONE HCL 10 MG PO TABS: 10.0000 mg | ORAL_TABLET | Freq: Three times a day (TID) | ORAL | 1 refills | Status: DC | PRN

## 2016-07-05 MED ORDER — ALBUTEROL SULFATE HFA 108 (90 BASE) MCG/ACT IN AERS: 2.0000 | INHALATION_SPRAY | Freq: Four times a day (QID) | RESPIRATORY_TRACT | 2 refills | Status: DC | PRN

## 2016-07-05 MED ORDER — METRONIDAZOLE 0.75 % EX CREA: TOPICAL_CREAM | Freq: Two times a day (BID) | CUTANEOUS | 1 refills | Status: DC

## 2016-07-05 MED ORDER — LOSARTAN POTASSIUM-HCTZ 100-25 MG PO TABS: 1.0000 | ORAL_TABLET | Freq: Every day | ORAL | 3 refills | Status: DC

## 2016-07-05 NOTE — Progress Notes (Signed)
Subjective:    Patient ID: Hayley Crawford, female    DOB: 04-02-1966, 51 y.o.   MRN: [id number]  Pt presents to the office today for CPE without pap.  Hypertension  This is a chronic problem. The current episode started more than 1 year ago. The problem has been resolved since onset. The problem is controlled. Associated symptoms include anxiety. Pertinent negatives include no blurred vision, chest pain, headaches, malaise/fatigue, palpitations, peripheral edema or shortness of breath. Risk factors for coronary artery disease include dyslipidemia, obesity, post-menopausal state, family history and smoking/tobacco exposure. Past treatments include diuretics and angiotensin blockers. The current treatment provides significant improvement. There is no history of kidney disease, CAD/MI, CVA or heart failure. Identifiable causes of hypertension include sleep apnea. There is no history of a thyroid problem.  Anxiety  Presents for follow-up visit. Onset was 1 to 6 months ago. Symptoms include excessive worry and nervous/anxious behavior. Patient reports no chest pain, decreased concentration, depressed mood, insomnia, palpitations, panic or shortness of breath. Symptoms occur occasionally.   Her past medical history is significant for anemia and anxiety/panic attacks. There is no history of depression. Past treatments include non-benzodiazephine anxiolytics. The treatment provided moderate relief. Compliance with prior treatments has been good.  Gastroesophageal Reflux  She reports no chest pain, no coughing, no heartburn, no sore throat or no wheezing. This is a chronic problem. The current episode started more than 1 year ago. The problem occurs rarely. The symptoms are aggravated by certain foods. Pertinent negatives include no fatigue or muscle weakness. She has tried a PPI for the symptoms. The treatment provided significant relief.  Hyperlipidemia  This is a chronic problem. The current episode started  more than 1 year ago. The problem is controlled. Recent lipid tests were reviewed and are normal. Exacerbating diseases include obesity. She has no history of diabetes or hypothyroidism. Factors aggravating her hyperlipidemia include smoking. Pertinent negatives include no chest pain or shortness of breath. Current antihyperlipidemic treatment includes statins. The current treatment provides no improvement of lipids. Compliance problems include adherence to diet.  Risk factors for coronary artery disease include dyslipidemia, family history, hypertension and a sedentary lifestyle.  Anemia  Presents for follow-up visit. There has been no bruising/bleeding easily, light-headedness, malaise/fatigue or palpitations. Past treatments include changes in diet. There is no history of heart failure or hypothyroidism. Family history includes iron deficiency.  COPD PT currently taking Breo as needed (because I can't afford it) and use albuterol as needed ("I usually take it before I going walking") . PT currently smoking 1 1/2 packs a day. PT states she is trying to "cut back" Metabolic Syndrome PT states she is exercising on a exercise bike for 6 mins two times a day, walking her dog daily. Does not watch what she eats.    Review of Systems  Constitutional: Negative for fatigue and malaise/fatigue.  HENT: Negative.  Negative for sore throat.   Eyes: Negative.  Negative for blurred vision.  Respiratory: Negative.  Negative for cough, shortness of breath and wheezing.   Cardiovascular: Negative for chest pain and palpitations.  Gastrointestinal: Negative.  Negative for heartburn.  Endocrine: Negative.   Genitourinary: Negative.   Musculoskeletal: Negative.  Negative for muscle weakness.  Neurological: Negative.  Negative for light-headedness and headaches.  Hematological: Negative.  Does not bruise/bleed easily.  Psychiatric/Behavioral: Negative for decreased concentration. The patient is nervous/anxious.  The patient does not have insomnia.   All other systems reviewed and are negative.  Objective:   Physical Exam  Constitutional: She is oriented to person, place, and time. She appears well-developed and well-nourished. No distress.  Morbid obese   HENT:  Head: Normocephalic and atraumatic.  Right Ear: External ear normal.  Left Ear: External ear normal.  Nose: Nose normal.  Mouth/Throat: Oropharynx is clear and moist.  erythemas present on bilateral cheeks, chin, and forehead  Eyes: Pupils are equal, round, and reactive to light.  Neck: Normal range of motion. Neck supple. No thyromegaly present.  Cardiovascular: Normal rate, regular rhythm, normal heart sounds and intact distal pulses.   No murmur heard. Pulmonary/Chest: Effort normal and breath sounds normal. No respiratory distress. She has no wheezes.  Abdominal: Soft. Bowel sounds are normal. She exhibits no distension. There is no tenderness.  Musculoskeletal: Normal range of motion. She exhibits no edema or tenderness.  Neurological: She is alert and oriented to person, place, and time.  Skin: Skin is warm and dry.  Psychiatric: She has a normal mood and affect. Her behavior is normal. Judgment and thought content normal.  Vitals reviewed.   BP 116/80   Pulse 81   Temp 98 F (36.7 C) (Oral)   Ht '5\' 2"'$  (1.575 m)   Wt 243 lb 6.4 oz (110.4 kg)   LMP 05/31/2014   BMI 44.52 kg/m        Assessment & Plan:  1. Essential hypertension - CMP14+EGFR - losartan-hydrochlorothiazide (HYZAAR) 100-25 MG tablet; Take 1 tablet by mouth daily.  Dispense: 90 tablet; Refill: 3  2. Chronic obstructive pulmonary disease, unspecified COPD type ([place]) -Breo changed to symbicort related to price - CMP14+EGFR - budesonide-formoterol (SYMBICORT) 80-4.5 MCG/ACT inhaler; Inhale 2 puffs into the lungs 2 (two) times daily.  Dispense: 1 Inhaler; Refill: 3 - albuterol (PROVENTIL HFA;VENTOLIN HFA) 108 (90 Base) MCG/ACT inhaler; Inhale 2  puffs into the lungs every 6 (six) hours as needed for wheezing or shortness of breath.  Dispense: 1 Inhaler; Refill: 2  3. Gastroesophageal reflux disease, esophagitis presence not specified - CMP14+EGFR - omeprazole (PRILOSEC) 20 MG capsule; Take 1 capsule (20 mg total) by mouth daily.  Dispense: 90 capsule; Refill: 2  4. Moderate episode of recurrent major depressive disorder (HCC) - CMP14+EGFR  5. Vitamin D deficiency - CMP14+EGFR - VITAMIN D 25 Hydroxy (Vit-D Deficiency, Fractures)  6. Vitamin B 12 deficiency - CMP14+EGFR  7. Tobacco user - CMP14+EGFR  8. Morbid obesity with BMI of 40.0-44.9, adult (HCC) - GYJ85+UDJS  9. Metabolic syndrome - HFW26+VZCH - Lipid panel  10. Iron deficiency anemia, unspecified iron deficiency anemia type - CMP14+EGFR - Anemia Profile B  11. GAD (generalized anxiety disorder) - CMP14+EGFR - busPIRone (BUSPAR) 10 MG tablet; Take 1 tablet (10 mg total) by mouth 3 (three) times daily as needed.  Dispense: 90 tablet; Refill: 1  12. Hyperlipidemia, unspecified hyperlipidemia type - CMP14+EGFR - Lipid panel  13. COPD exacerbation (HCC) - CMP14+EGFR - albuterol (PROVENTIL HFA;VENTOLIN HFA) 108 (90 Base) MCG/ACT inhaler; Inhale 2 puffs into the lungs every 6 (six) hours as needed for wheezing or shortness of breath.  Dispense: 1 Inhaler; Refill: 2  14. Annual physical exam - CMP14+EGFR - Lipid panel - Thyroid Panel With TSH - VITAMIN D 25 Hydroxy (Vit-D Deficiency, Fractures) - Anemia Profile B  15. Rosacea -Pt given rx of metrocream to see if helps - metroNIDAZOLE (METROCREAM) 0.75 % cream; Apply topically 2 (two) times daily.  Dispense: 45 g; Refill: 1   Continue all meds Labs pending Health Maintenance reviewed Diet  and exercise encouraged RTO 6 months   [name], FNP

## 2016-07-05 NOTE — Patient Instructions (Signed)
Rosacea Rosacea is a long-term (chronic) condition that affects the skin of the face, including the cheeks, nose, brow, and chin. This condition can also affect the eyes. Rosacea causes blood vessels near the surface of the skin to enlarge, which results in redness. What are the causes? The cause of this condition is not known. Certain triggers can make rosacea worse, including:  Hot baths.  Exercise.  Sunlight.  Very hot or cold temperatures.  Hot or spicy foods and drinks.  Drinking alcohol.  Stress.  Taking blood pressure medicine.  Long-term use of topical steroids on the face. What increases the risk? This condition is more likely to develop in:  People who are older than 53 years of age.  Women.  People who have light-colored skin (light complexion).  People who have a family history of rosacea. What are the signs or symptoms? Symptoms of this condition include:  Redness of the face.  Red bumps or pimples on the face.  A red, enlarged nose.  Blushing easily.  Red lines on the skin.  Irritated or burning feeling in the eyes.  Swollen eyelids. How is this diagnosed? This condition is diagnosed with a medical history and physical exam. How is this treated? There is no cure for this condition, but treatment can help to control your symptoms. Your health care provider may recommend that you see a skin specialist (dermatologist). Treatment may include:  Antibiotic medicines that are applied to the skin or taken as a pill.  Laser treatment to improve the appearance of the skin.  Surgery. This is rare. Your health care provider will also recommend the best way to take care of your skin. Even after your skin improves, you will likely need to continue treatment to prevent your rosacea from coming back. Follow these instructions at home: Skin Care  Take care of your skin as told by your health care provider. You may be told to do these things:  Wash your skin  gently two or more times each day.  Use mild soap.  Use a sunscreen or sunblock with SPF 30 or greater.  Use gentle cosmetics that are meant for sensitive skin.  Shave with an electric shaver instead of a blade. Lifestyle   Try to keep track of what foods trigger this condition. Avoid any triggers. These may include:  Spicy foods.  Seafood.  Cheese.  Hot liquids.  Nuts.  Chocolate.  Iodized salt.  Do not drink alcohol.  Avoid extremely cold or hot temperatures.  Try to reduce your stress. If you need help, talk with your health care provider.  When you exercise, do these things to stay cool:  Limit your sun exposure.  Use a fan.  Do shorter and more frequent intervals of exercise. General instructions   Keep all follow-up visits as told by your health care provider. This is important.  Take over-the-counter and prescription medicines only as told by your health care provider.  If your eyelids are affected, apply warm compresses to them. Do this as told by your health care provider.  If you were prescribed an antibiotic medicine, apply or take it as told by your health care provider. Do not stop using the antibiotic even if your condition improves. Contact a health care provider if:  Your symptoms get worse.  Your symptoms do not improve after two months of treatment.  You have new symptoms.  You have any changes in vision or you have problems with your eyes, such as redness or   itching.  You feel depressed.  You lose your appetite.  You have trouble concentrating. This information is not intended to replace advice given to you by your health care provider. Make sure you discuss any questions you have with your health care provider. Document Released: 05/30/2004 Document Revised: 09/28/2015 Document Reviewed: 06/29/2014 Elsevier Interactive Patient Education  2017 [employer]  

## 2016-07-06 LAB — CMP14+EGFR
ALK PHOS: 94 IU/L (ref 39–117)
ALT: 35 IU/L — ABNORMAL HIGH (ref 0–32)
AST: 21 IU/L (ref 0–40)
Albumin/Globulin Ratio: 1.3 (ref 1.2–2.2)
Albumin: 4 g/dL (ref 3.5–5.5)
BILIRUBIN TOTAL: 0.2 mg/dL (ref 0.0–1.2)
BUN/Creatinine Ratio: 18 (ref 9–23)
BUN: 12 mg/dL (ref 6–24)
CHLORIDE: 99 mmol/L (ref 96–106)
CO2: 22 mmol/L (ref 18–29)
Calcium: 9.5 mg/dL (ref 8.7–10.2)
Creatinine, Ser: 0.68 mg/dL (ref 0.57–1.00)
GFR calc non Af Amer: 100 mL/min/{1.73_m2} (ref >59)
GFR, EST AFRICAN AMERICAN: 115 mL/min/{1.73_m2} (ref >59)
GLUCOSE: 82 mg/dL (ref 65–99)
Globulin, Total: 3 g/dL (ref 1.5–4.5)
POTASSIUM: 3.9 mmol/L (ref 3.5–5.2)
Sodium: 140 mmol/L (ref 134–144)
TOTAL PROTEIN: 7 g/dL (ref 6.0–8.5)

## 2016-07-06 LAB — ANEMIA PROFILE B
BASOS: 0 %
Basophils Absolute: 0 10*3/uL (ref 0.0–0.2)
EOS (ABSOLUTE): 0.3 10*3/uL (ref 0.0–0.4)
Eos: 3 %
FERRITIN: 30 ng/mL (ref 15–150)
Folate: 8.8 ng/mL (ref >3.0)
HEMATOCRIT: 39.8 % (ref 34.0–46.6)
HEMOGLOBIN: 13.4 g/dL (ref 11.1–15.9)
IMMATURE GRANS (ABS): 0 10*3/uL (ref 0.0–0.1)
IRON: 60 ug/dL (ref 27–159)
Immature Granulocytes: 0 %
Iron Saturation: 15 % (ref 15–55)
LYMPHS: 23 %
Lymphocytes Absolute: 2.1 10*3/uL (ref 0.7–3.1)
MCH: 31.6 pg (ref 26.6–33.0)
MCHC: 33.7 g/dL (ref 31.5–35.7)
MCV: 94 fL (ref 79–97)
MONOCYTES: 7 %
Monocytes Absolute: 0.7 10*3/uL (ref 0.1–0.9)
NEUTROS ABS: 6 10*3/uL (ref 1.4–7.0)
Neutrophils: 67 %
Platelets: 327 10*3/uL (ref 150–379)
RBC: 4.24 x10E6/uL (ref 3.77–5.28)
RDW: 14.7 % (ref 12.3–15.4)
RETIC CT PCT: 2.3 % (ref 0.6–2.6)
TIBC: 406 ug/dL (ref 250–450)
UIBC: 346 ug/dL (ref 131–425)
VITAMIN B 12: 249 pg/mL (ref 232–1245)
WBC: 9.2 10*3/uL (ref 3.4–10.8)

## 2016-07-06 LAB — LIPID PANEL
Chol/HDL Ratio: 4.6 ratio units — ABNORMAL HIGH (ref 0.0–4.4)
Cholesterol, Total: 217 mg/dL — ABNORMAL HIGH (ref 100–199)
HDL: 47 mg/dL (ref >39)
LDL Calculated: 141 mg/dL — ABNORMAL HIGH (ref 0–99)
TRIGLYCERIDES: 146 mg/dL (ref 0–149)
VLDL CHOLESTEROL CAL: 29 mg/dL (ref 5–40)

## 2016-07-06 LAB — THYROID PANEL WITH TSH
FREE THYROXINE INDEX: 1.7 (ref 1.2–4.9)
T3 Uptake Ratio: 25 % (ref 24–39)
T4, Total: 6.7 ug/dL (ref 4.5–12.0)
TSH: 2.02 u[IU]/mL (ref 0.450–4.500)

## 2016-07-06 LAB — VITAMIN D 25 HYDROXY (VIT D DEFICIENCY, FRACTURES): VIT D 25 HYDROXY: 14.7 ng/mL — AB (ref 30.0–100.0)

## 2016-07-08 ENCOUNTER — Other Ambulatory Visit: Payer: Self-pay | Admitting: Family

## 2016-07-08 MED ORDER — VITAMIN D (ERGOCALCIFEROL) 1.25 MG (50000 UNIT) PO CAPS: 50000.0000 [IU] | ORAL_CAPSULE | ORAL | 3 refills | Status: DC

## 2016-07-08 MED ORDER — SIMVASTATIN 20 MG PO TABS: 20.0000 mg | ORAL_TABLET | Freq: Every day | ORAL | 3 refills | Status: DC

## 2016-07-13 ENCOUNTER — Other Ambulatory Visit: Payer: Self-pay | Admitting: Family

## 2016-07-13 DIAGNOSIS — M5441 Lumbago with sciatica, right side: Secondary | ICD-10-CM

## 2016-10-02 ENCOUNTER — Other Ambulatory Visit: Payer: Self-pay | Admitting: Family

## 2016-10-02 DIAGNOSIS — M5441 Lumbago with sciatica, right side: Secondary | ICD-10-CM

## 2016-10-02 NOTE — Telephone Encounter (Signed)
Seen [facility] 07/05/16- please address refill

## 2016-12-03 ENCOUNTER — Ambulatory Visit (INDEPENDENT_AMBULATORY_CARE_PROVIDER_SITE_OTHER): Payer: Worker's Compensation | Admitting: Family

## 2016-12-03 ENCOUNTER — Encounter: Payer: Self-pay | Admitting: Family

## 2016-12-03 VITALS — BP 154/100 | HR 78 | Temp 97.6°F | Ht 62.0 in | Wt 243.0 lb

## 2016-12-03 DIAGNOSIS — M7582 Other shoulder lesions, left shoulder: Secondary | ICD-10-CM

## 2016-12-03 DIAGNOSIS — M25512 Pain in left shoulder: Secondary | ICD-10-CM

## 2016-12-03 MED ORDER — PREDNISONE 10 MG (21) PO TBPK: ORAL_TABLET | ORAL | 0 refills | Status: DC

## 2016-12-03 MED ORDER — DICLOFENAC SODIUM 1 % TD GEL: 2.0000 g | Freq: Four times a day (QID) | TRANSDERMAL | 1 refills | Status: DC

## 2016-12-03 NOTE — Progress Notes (Signed)
   Subjective:    Patient ID: Hayley Crawford, female    DOB: 12/25/1965, 51 y.o.   MRN: [id number]  HPI Pt presents to the office today for Workers Comp injury, Hayley Crawford, that occurred 12/02/16. Pt states she was lifting a box (approx 117 lbs) from an elevated area and felt pain in left shoulder while "dropping the box".  Pt states the pain is constant, cramping of 5 out 10. States moving makes the pain worse. She has tried motrin with no relief. States it was hard for her to sleep related to tenderness in her shoulder.    Review of Systems  Musculoskeletal: Positive for arthralgias.  All other systems reviewed and are negative.      Objective:   Physical Exam  Constitutional: She is oriented to person, place, and time. She appears well-developed and well-nourished.  Neck: Normal range of motion. Neck supple. No tracheal deviation present. No thyromegaly present.  Cardiovascular: Normal rate, regular rhythm, normal heart sounds and intact distal pulses.   Pulmonary/Chest: Effort normal and breath sounds normal. No respiratory distress. She has no wheezes.  Abdominal: Soft. Bowel sounds are normal.  Musculoskeletal: She exhibits tenderness.  Full ROM shoulder, but has tenderness with abduction and extension    Neurological: She is alert and oriented to person, place, and time.     BP (!) 154/100   Pulse 78   Temp 97.6 F (36.4 C) (Oral)   Ht 5\' 2"  (1.575 m)   Wt 243 lb (110.2 kg)   LMP 05/31/2014   BMI 44.45 kg/m       Assessment & Plan:  1. Tendinitis of left rotator cuff Rest Ice  Continue Motrin as needed PT requests not to be on light duty this week, states she will avoid lifting heavy items or ask for help. Will write for her to be off today and then return to regular duties tomorrow. RTO Prn  - diclofenac sodium (VOLTAREN) 1 % GEL; Apply 2 g topically 4 (four) times daily.  Dispense: 100 g; Refill: 1 - predniSONE (STERAPRED UNI-PAK 21 TAB) 10 MG (21) TBPK  tablet; Use as directed  Dispense: 21 tablet; Refill: 0  [name], FNP

## 2016-12-03 NOTE — Patient Instructions (Signed)
Rotator Cuff Tendinitis Rotator cuff tendinitis is inflammation of the tough, cord-like bands that connect muscle to bone (tendons) in the rotator cuff. The rotator cuff includes all of the muscles and tendons that connect the arm to the shoulder. The rotator cuff holds the head of the upper arm bone (humerus) in the cup (fossa) of the shoulder blade (scapula). This condition can lead to a long-lasting (chronic) tear. The tear may be partial or complete. What are the causes? This condition is usually caused by overusing the rotator cuff. What increases the risk? This condition is more likely to develop in athletes and workers who frequently use their shoulder or reach over their heads. This can include activities such as:  Tennis.  Baseball or softball.  Swimming.  Construction work.  Painting.  What are the signs or symptoms? Symptoms of this condition include:  Pain spreading (radiating) from the shoulder to the upper arm.  Swelling and tenderness in front of the shoulder.  Pain when reaching, pulling, or lifting the arm above the head.  Pain when lowering the arm from above the head.  Minor pain in the shoulder when resting.  Increased pain in the shoulder at night.  Difficulty placing the arm behind the back.  How is this diagnosed? This condition is diagnosed with a medical history and physical exam. Tests may also be done, including:  X-rays.  MRI.  Ultrasounds.  CT or MR arthrogram. During this test, a contrast material is injected and then images are taken.  How is this treated? Treatment for this condition depends on the severity of the condition. In less severe cases, treatment may include:  Rest. This may be done with a sling that holds the shoulder still (immobilization). Your health care provider may also recommend avoiding activities that involve lifting your arm over your head.  Icing the shoulder.  Anti-inflammatory medicines, such as aspirin or  ibuprofen.  In more severe cases, treatment may include:  Physical therapy.  Steroid injections.  Surgery.  Follow these instructions at home: If you have a sling:  Wear the sling as told by your health care provider. Remove it only as told by your health care provider.  Loosen the sling if your fingers tingle, become numb, or turn cold and blue.  Keep the sling clean.  If the sling is not waterproof, do not let it get wet. Remove it, if allowed, or cover it with a watertight covering when you take a bath or shower. Managing pain, stiffness, and swelling  If directed, put ice on the injured area. ? If you have a removable sling, remove it as told by your health care provider. ? Put ice in a plastic bag. ? Place a towel between your skin and the bag. ? Leave the ice on for 20 minutes, 2-3 times a day.  Move your fingers often to avoid stiffness and to lessen swelling.  Raise (elevate) the injured area above the level of your heart while you are lying down.  Find a comfortable sleeping position or sleep on a recliner, if available. Driving  Do not drive or use heavy machinery while taking prescription pain medicine.  Ask your health care provider when it is safe to drive if you have a sling on your arm. Activity  Rest your shoulder as told by your health care provider.  Return to your normal activities as told by your health care provider. Ask your health care provider what activities are safe for you.  Do any   exercises or stretches as told by your health care provider.  If you do repetitive overhead tasks, take small breaks in between and include stretching exercises as told by your health care provider. General instructions  Do not use any products that contain nicotine or tobacco, such as cigarettes and e-cigarettes. These can delay healing. If you need help quitting, ask your health care provider.  Take over-the-counter and prescription medicines only as told by  your health care provider.  Keep all follow-up visits as told by your health care provider. This is important. Contact a health care provider if:  Your pain gets worse.  You have new pain in your arm, hands, or fingers.  Your pain is not relieved with medicine or does not get better after 6 weeks of treatment.  You have cracking sensations when moving your shoulder in certain directions.  You hear a snapping sound after using your shoulder, followed by severe pain and weakness. Get help right away if:  Your arm, hand, or fingers are numb or tingling.  Your arm, hand, or fingers are swollen or painful or they turn white or blue. Summary  Rotator cuff tendinitis is inflammation of the tough, cord-like bands that connect muscle to bone (tendons) in the rotator cuff.  This condition is usually caused by overusing the rotator cuff, which includes all of the muscles and tendons that connect the arm to the shoulder.  This condition is more likely to develop in athletes and workers who frequently use their shoulder or reach over their heads.  Treatment generally includes rest, anti-inflammatory medicines, and icing. In some cases, physical therapy and steroid injections may be needed. In severe cases, surgery may be needed. This information is not intended to replace advice given to you by your health care provider. Make sure you discuss any questions you have with your health care provider. Document Released: 07/13/2003 Document Revised: 04/08/2016 Document Reviewed: 04/08/2016 Elsevier Interactive Patient Education  2017 [employer] Inc.  

## 2016-12-06 ENCOUNTER — Other Ambulatory Visit: Payer: Self-pay | Admitting: Family

## 2016-12-06 DIAGNOSIS — M5441 Lumbago with sciatica, right side: Secondary | ICD-10-CM

## 2017-01-14 ENCOUNTER — Ambulatory Visit (INDEPENDENT_AMBULATORY_CARE_PROVIDER_SITE_OTHER): Payer: BLUE CROSS/BLUE SHIELD | Admitting: Family Medicine

## 2017-01-14 VITALS — BP 139/83 | HR 90 | Temp 98.0°F | Ht 62.0 in | Wt 244.0 lb

## 2017-01-14 DIAGNOSIS — J019 Acute sinusitis, unspecified: Secondary | ICD-10-CM

## 2017-01-14 DIAGNOSIS — L049 Acute lymphadenitis, unspecified: Secondary | ICD-10-CM

## 2017-01-14 DIAGNOSIS — B9689 Other specified bacterial agents as the cause of diseases classified elsewhere: Secondary | ICD-10-CM | POA: Diagnosis not present

## 2017-01-14 MED ORDER — AMOXICILLIN-POT CLAVULANATE 875-125 MG PO TABS: 1.0000 | ORAL_TABLET | Freq: Two times a day (BID) | ORAL | 0 refills | Status: DC

## 2017-01-14 NOTE — Patient Instructions (Signed)
If okay that you continue the nasal spray, Tessalon Perles for cough, and ibuprofen as needed for headache or pain. I prescribed you all, and 10 to take by mouth twice a day for the next 10 days. This medication sometimes can cause GI upset, including bloating and diarrhea. Consider eating yogurt or taking a probiotic while on this medication to reduce the symptoms. If you have fevers, chills, worsening symptoms after 48 hours on antibiotics please seek immediate medical attention.  Sinusitis, Adult Sinusitis is soreness and inflammation of your sinuses. Sinuses are hollow spaces in the bones around your face. Your sinuses are located:  Around your eyes.  In the middle of your forehead.  Behind your nose.  In your cheekbones.  Your sinuses and nasal passages are lined with a stringy fluid (mucus). Mucus normally drains out of your sinuses. When your nasal tissues become inflamed or swollen, the mucus can become trapped or blocked so air cannot flow through your sinuses. This allows bacteria, viruses, and funguses to grow, which leads to infection. Sinusitis can develop quickly and last for 7?10 days (acute) or for more than 12 weeks (chronic). Sinusitis often develops after a cold. What are the causes? This condition is caused by anything that creates swelling in the sinuses or stops mucus from draining, including:  Allergies.  Asthma.  Bacterial or viral infection.  Abnormally shaped bones between the nasal passages.  Nasal growths that contain mucus (nasal polyps).  Narrow sinus openings.  Pollutants, such as chemicals or irritants in the air.  A foreign object stuck in the nose.  A fungal infection. This is rare.  What increases the risk? The following factors may make you more likely to develop this condition:  Having allergies or asthma.  Having had a recent cold or respiratory tract infection.  Having structural deformities or blockages in your nose or  sinuses.  Having a weak immune system.  Doing a lot of swimming or diving.  Overusing nasal sprays.  Smoking.  What are the signs or symptoms? The main symptoms of this condition are pain and a feeling of pressure around the affected sinuses. Other symptoms include:  Upper toothache.  Earache.  Headache.  Bad breath.  Decreased sense of smell and taste.  A cough that may get worse at night.  Fatigue.  Fever.  Thick drainage from your nose. The drainage is often green and it may contain pus (purulent).  Stuffy nose or congestion.  Postnasal drip. This is when extra mucus collects in the throat or back of the nose.  Swelling and warmth over the affected sinuses.  Sore throat.  Sensitivity to light.  How is this diagnosed? This condition is diagnosed based on symptoms, a medical history, and a physical exam. To find out if your condition is acute or chronic, your health care provider may:  Look in your nose for signs of nasal polyps.  Tap over the affected sinus to check for signs of infection.  View the inside of your sinuses using an imaging device that has a light attached (endoscope).  If your health care provider suspects that you have chronic sinusitis, you may also:  Be tested for allergies.  Have a sample of mucus taken from your nose (nasal culture) and checked for bacteria.  Have a mucus sample examined to see if your sinusitis is related to an allergy.  If your sinusitis does not respond to treatment and it lasts longer than 8 weeks, you may have an MRI or  CT scan to check your sinuses. These scans also help to determine how severe your infection is. In rare cases, a bone biopsy may be done to rule out more serious types of fungal sinus disease. How is this treated? Treatment for sinusitis depends on the cause and whether your condition is chronic or acute. If a virus is causing your sinusitis, your symptoms will go away on their own within 10  days. You may be given medicines to relieve your symptoms, including:  Topical nasal decongestants. They shrink swollen nasal passages and let mucus drain from your sinuses.  Antihistamines. These drugs block inflammation that is triggered by allergies. This can help to ease swelling in your nose and sinuses.  Topical nasal corticosteroids. These are nasal sprays that ease inflammation and swelling in your nose and sinuses.  Nasal saline washes. These rinses can help to get rid of thick mucus in your nose.  If your condition is caused by bacteria, you will be given an antibiotic medicine. If your condition is caused by a fungus, you will be given an antifungal medicine. Surgery may be needed to correct underlying conditions, such as narrow nasal passages. Surgery may also be needed to remove polyps. Follow these instructions at home: Medicines  Take, use, or apply over-the-counter and prescription medicines only as told by your health care provider. These may include nasal sprays.  If you were prescribed an antibiotic medicine, take it as told by your health care provider. Do not stop taking the antibiotic even if you start to feel better. Hydrate and Humidify  Drink enough water to keep your urine clear or pale yellow. Staying hydrated will help to thin your mucus.  Use a cool mist humidifier to keep the humidity level in your home above 50%.  Inhale steam for 10-15 minutes, 3-4 times a day or as told by your health care provider. You can do this in the bathroom while a hot shower is running.  Limit your exposure to cool or dry air. Rest  Rest as much as possible.  Sleep with your head raised (elevated).  Make sure to get enough sleep each night. General instructions  Apply a warm, moist washcloth to your face 3-4 times a day or as told by your health care provider. This will help with discomfort.  Wash your hands often with soap and water to reduce your exposure to viruses and  other germs. If soap and water are not available, use hand sanitizer.  Do not smoke. Avoid being around people who are smoking (secondhand smoke).  Keep all follow-up visits as told by your health care provider. This is important. Contact a health care provider if:  You have a fever.  Your symptoms get worse.  Your symptoms do not improve within 10 days. Get help right away if:  You have a severe headache.  You have persistent vomiting.  You have pain or swelling around your face or eyes.  You have vision problems.  You develop confusion.  Your neck is stiff.  You have trouble breathing. This information is not intended to replace advice given to you by your health care provider. Make sure you discuss any questions you have with your health care provider. Document Released: 04/22/2005 Document Revised: 12/17/2015 Document Reviewed: 02/15/2015 Elsevier Interactive Patient Education  2017 [employer].

## 2017-01-14 NOTE — Progress Notes (Signed)
Subjective: CC: URI symptoms PCP: Sharion Balloon, FNP Hayley Crawford is a 51 y.o. female presenting to clinic today for:  1. URI symptoms Patient reports acute onset of nonproductive cough, rhinorrhea, ear fullness and subjective fevers one week ago. She notes that symptoms actually had improved but then got worse this weekend. She reports headache, facial pain over the maxillary sinuses, and swollen lymph nodes. She has been hydrating and tolerating by mouth intake without difficulty. She's been using a nasal spray, Tessalon Perles, ibuprofen, and an over-the-counter cough and cold medication to help with symptoms. She denies recent travel, sick contacts, chest pain, shortness of breath, wheeze, hemoptysis, or nasal purulence.  Allergies  Allergen Reactions  . Sulfa Antibiotics Swelling    "think it caused my throat to swell"   Past Medical History:  Diagnosis Date  . B12 deficiency   . COPD (chronic obstructive pulmonary disease) ([place])   . Hyperlipidemia   . Hypertension   . Uterine fibroid    Family History  Problem Relation Age of Onset  . Diabetes Other   . Cancer Mother        lung  . Brain cancer Mother        tumor   Social Hx: active smoker.Current medications reviewed.   ROS: Per HPI  Objective: Office vital signs reviewed. BP 139/83   Pulse 90   Temp 98 F (36.7 C) (Oral)   Ht 5\' 2"  (1.575 m)   Wt 244 lb (110.7 kg)   LMP 05/31/2014   BMI 44.63 kg/m   Physical Examination:  General: Awake, alert, obese, No acute distress HEENT: +TTP to maxillary sinuses    Neck: No masses palpated. +enlarged, tender anterior cervical lymph nodes bilaterally L>R.    Ears: Tympanic membranes intact, slight bulging of bilateral TMs. Left external auditory canal with swelling but no exudate. Her right TM is remarkable for a small hemorrhage within the membrane near the 12:00 position. No bleeding within the ear canal no perforation.    Eyes: no ocular discharge, sclera  white    Nose: nasal turbinates moist, clear nasal discharge    Throat: moist mucus membranes, mild o/p erythema, no tonsillar exudate.  Airway is patent Cardio: regular rate and rhythm, S1S2 heard, no murmurs appreciated Pulm: Globally decreased breath sounds with prolonged expiratory phase; no wheezes, rhonchi or rales; normal work of breathing on room air  Assessment/ Plan: 51 y.o. female   1. Acute bacterial sinusitis Clinically consistent with ABS. She has facial tenderness on exam. No nasal purulence appreciated.  We will treat with Augmentin twice a day for the next 10 days. I advised her that she may continue with nasal spray, Tessalon Perles as needed for cough, and ibuprofen as needed for fever or pain. I did discuss with her I anticipate symptoms should start improving within the next 2 days. Return precautions were reviewed, including red flag symptoms and reasons to seek emergent medical attention. Patient to follow-up as needed. - amoxicillin-clavulanate (AUGMENTIN) 875-125 MG tablet; Take 1 tablet by mouth 2 (two) times daily.  Dispense: 20 tablet; Refill: 0  2. Acute lymphadenitis I suspect this is secondary to upper respiratory bacterial infection. I did discuss with patient that if pain or swelling were to persist outside of active infection, she should be reevaluated. She voiced good understanding.   Meds ordered this encounter  Medications  . amoxicillin-clavulanate (AUGMENTIN) 875-125 MG tablet    Sig: Take 1 tablet by mouth 2 (two) times daily.  Dispense:  20 tablet    Refill:  Martin, DO [place] [phone]

## 2017-01-15 ENCOUNTER — Encounter: Payer: Self-pay | Admitting: Family Medicine

## 2017-01-15 ENCOUNTER — Telehealth: Payer: Self-pay | Admitting: Family Medicine

## 2017-01-15 ENCOUNTER — Other Ambulatory Visit: Payer: Self-pay | Admitting: Family Medicine

## 2017-01-15 ENCOUNTER — Telehealth: Payer: Self-pay | Admitting: *Deleted

## 2017-01-15 DIAGNOSIS — J0100 Acute maxillary sinusitis, unspecified: Secondary | ICD-10-CM

## 2017-01-15 MED ORDER — PROMETHAZINE HCL 25 MG PO TABS: 12.5000 mg | ORAL_TABLET | Freq: Three times a day (TID) | ORAL | 0 refills | Status: DC | PRN

## 2017-01-15 MED ORDER — DOXYCYCLINE HYCLATE 100 MG PO TABS: 100.0000 mg | ORAL_TABLET | Freq: Two times a day (BID) | ORAL | 0 refills | Status: DC

## 2017-01-15 NOTE — Telephone Encounter (Signed)
Pt notified of recommendation and new RX Letter to front for pt pick up

## 2017-01-15 NOTE — Telephone Encounter (Signed)
Promethazine sent to pharmacy 

## 2017-01-15 NOTE — Progress Notes (Signed)
Patient not tolerating Augmentin. We'll replace with doxycycline 100 mg twice a day for the next 10 days.

## 2017-01-15 NOTE — Telephone Encounter (Signed)
Advise her to stop Augmentin.  I have prescribed Doxycycline instead.  Please inform her that she needs to eat with this medication as well.  Work note in chart for her to pick up.

## 2017-01-15 NOTE — Telephone Encounter (Signed)
Please review and advise.

## 2017-04-01 ENCOUNTER — Emergency Department (HOSPITAL_COMMUNITY): Payer: BLUE CROSS/BLUE SHIELD

## 2017-04-01 ENCOUNTER — Emergency Department (HOSPITAL_COMMUNITY)
Admission: EM | Admit: 2017-04-01 | Discharge: 2017-04-01 | Disposition: A | Discharge status: Home / Self Care | Payer: BLUE CROSS/BLUE SHIELD | Attending: Emergency Medicine | Admitting: Emergency Medicine

## 2017-04-01 ENCOUNTER — Encounter (HOSPITAL_COMMUNITY): Payer: Self-pay | Admitting: Emergency Medicine

## 2017-04-01 DIAGNOSIS — R079 Chest pain, unspecified: Secondary | ICD-10-CM

## 2017-04-01 DIAGNOSIS — I10 Essential (primary) hypertension: Secondary | ICD-10-CM | POA: Insufficient documentation

## 2017-04-01 DIAGNOSIS — J449 Chronic obstructive pulmonary disease, unspecified: Secondary | ICD-10-CM | POA: Insufficient documentation

## 2017-04-01 DIAGNOSIS — Z79899 Other long term (current) drug therapy: Secondary | ICD-10-CM | POA: Insufficient documentation

## 2017-04-01 DIAGNOSIS — H538 Other visual disturbances: Secondary | ICD-10-CM | POA: Diagnosis not present

## 2017-04-01 DIAGNOSIS — Z6841 Body Mass Index (BMI) 40.0 and over, adult: Secondary | ICD-10-CM | POA: Diagnosis not present

## 2017-04-01 DIAGNOSIS — R0602 Shortness of breath: Secondary | ICD-10-CM | POA: Diagnosis not present

## 2017-04-01 DIAGNOSIS — F17210 Nicotine dependence, cigarettes, uncomplicated: Secondary | ICD-10-CM | POA: Diagnosis not present

## 2017-04-01 DIAGNOSIS — R0789 Other chest pain: Secondary | ICD-10-CM | POA: Diagnosis not present

## 2017-04-01 LAB — BASIC METABOLIC PANEL
ANION GAP: 9 (ref 5–15)
BUN: 10 mg/dL (ref 6–20)
CALCIUM: 9.7 mg/dL (ref 8.9–10.3)
CO2: 27 mmol/L (ref 22–32)
Chloride: 105 mmol/L (ref 101–111)
Creatinine, Ser: 0.72 mg/dL (ref 0.44–1.00)
Glucose, Bld: 95 mg/dL (ref 65–99)
Potassium: 3.6 mmol/L (ref 3.5–5.1)
SODIUM: 141 mmol/L (ref 135–145)

## 2017-04-01 LAB — I-STAT TROPONIN, ED
TROPONIN I, POC: 0 ng/mL (ref 0.00–0.08)
TROPONIN I, POC: 0 ng/mL (ref 0.00–0.08)

## 2017-04-01 LAB — CBC
HCT: 39.9 % (ref 36.0–46.0)
HEMOGLOBIN: 13.4 g/dL (ref 12.0–15.0)
MCH: 32.7 pg (ref 26.0–34.0)
MCHC: 33.6 g/dL (ref 30.0–36.0)
MCV: 97.3 fL (ref 78.0–100.0)
PLATELETS: 268 10*3/uL (ref 150–400)
RBC: 4.1 MIL/uL (ref 3.87–5.11)
RDW: 12.9 % (ref 11.5–15.5)
WBC: 7.8 10*3/uL (ref 4.0–10.5)

## 2017-04-01 LAB — I-STAT BETA HCG BLOOD, ED (MC, WL, AP ONLY): I-stat hCG, quantitative: <5 m[IU]/mL (ref <5)

## 2017-04-01 MED ORDER — ASPIRIN 81 MG PO CHEW: 324.0000 mg | CHEWABLE_TABLET | Freq: Once | ORAL | Status: DC

## 2017-04-01 MED ORDER — IPRATROPIUM-ALBUTEROL 0.5-2.5 (3) MG/3ML IN SOLN
3.0000 mL | Freq: Once | RESPIRATORY_TRACT | Status: AC
  Administered 2017-04-01: 3 mL via RESPIRATORY_TRACT
  Filled 2017-04-01: qty 3

## 2017-04-01 NOTE — Discharge Instructions (Addendum)
You have been seen today for chest pain.  Your lab results were reassuring, however, you will need close follow up with a cardiologist.  You have an appointment with cardiologist Dr. Bronson Ing at 8:40 AM on Friday, November 30. Return to the ED immediately should symptoms return.

## 2017-04-01 NOTE — ED Notes (Signed)
Patient transported to x-ray. ?

## 2017-04-01 NOTE — ED Provider Notes (Signed)
[facility]GENCY DEPARTMENT Provider Note   CSN: [id number] Arrival date & time: 04/01/17  1140     History   Chief Complaint Chief Complaint  Patient presents with  . Chest Pain    HPI Hayley Crawford is a 51 y.o. female.  HPI   Hayley Crawford is a 51 y.o. female, with a history of COPD, HTN, and hyperlipidemia, presenting to the ED with chest pain beginning around 8 AM this morning.  Left-sided chest pain, aching, radiating to left shoulder, currently 2/10, constant.  Accompanied by lightheadedness and SOB during exertion. No change in chest pain with deep breathing.   Was seen at [facility], received albuterol nebulizer, was sent here to ED due to concern for EKG abnormalities. Symptoms improved following nebulizer treatment. Received 1 SL NTG with EMS during transport to ED from [place].  Noncompliant with inhalers at home.  Took her daily 81 mg aspirin this morning.  Was then given 324 mg aspirin at the urgent care.   Denies fever/chills, acute cough, N/V/D, abdominal pain, diaphoresis, peripheral edema, or any other complaints.  No history of DVT/PE, recent surgeries, trauma, hormonal therapy.  Past Medical History:  Diagnosis Date  . B12 deficiency   . COPD (chronic obstructive pulmonary disease) ([place])   . Hyperlipidemia   . Hypertension   . Uterine fibroid     Patient Active Problem List   Diagnosis Date Noted  . Morbid obesity with BMI of 40.0-44.9, adult ([place]) 09/29/2015  . Metabolic syndrome 28/31/5176  . Vitamin D deficiency 06/07/2014  . Iron deficiency anemia 06/07/2014  . Tobacco user 06/11/2013  . COPD (chronic obstructive pulmonary disease) ([place])   . Aches 05/24/2013  . Hypertension   . GAD (generalized anxiety disorder) 08/27/2012  . Depression 08/27/2012  . GERD (gastroesophageal reflux disease) 08/27/2012  . Hyperlipidemia 08/27/2012  . Vitamin B 12 deficiency 08/27/2012    Past Surgical History:  Procedure Laterality Date  .  ABDOMINAL HYSTERECTOMY     Partial  . bladder stretching    . CHOLECYSTECTOMY    . HEMORROIDECTOMY    . TENDON REPAIR     right thumb  . tumor removed      OB History    No data available       Home Medications    Prior to Admission medications   Medication Sig Start Date End Date Taking? Authorizing Provider  albuterol (PROVENTIL HFA;VENTOLIN HFA) 108 (90 Base) MCG/ACT inhaler Inhale 2 puffs into the lungs every 6 (six) hours as needed for wheezing or shortness of breath. 07/05/16   [name] A, FNP  albuterol (PROVENTIL) (2.5 MG/3ML) 0.083% nebulizer solution Take 3 mLs (2.5 mg total) by nebulization every 6 (six) hours as needed for wheezing or shortness of breath. 06/06/14   Sharion Balloon, FNP  budesonide-formoterol (SYMBICORT) 80-4.5 MCG/ACT inhaler Inhale 2 puffs into the lungs 2 (two) times daily. 07/05/16   Sharion Balloon, FNP  busPIRone (BUSPAR) 10 MG tablet Take 1 tablet (10 mg total) by mouth 3 (three) times daily as needed. 07/05/16   Sharion Balloon, FNP  cyclobenzaprine (FLEXERIL) 10 MG tablet Take 1 tablet (10 mg total) by mouth 3 (three) times daily as needed for muscle spasms. Patient not taking: Reported on 07/05/2016 01/17/16   Terald Sleeper, PA-C  diclofenac sodium (VOLTAREN) 1 % GEL Apply 2 g topically 4 (four) times daily. 12/03/16   Sharion Balloon, FNP  doxycycline (VIBRA-TABS) 100 MG tablet Take 1  tablet (100 mg total) by mouth 2 (two) times daily. 01/15/17   Ronnie Doss M, DO  ibuprofen (ADVIL,MOTRIN) 600 MG tablet TAKE 1 TABLET BY MOUTH EVERY 8 HOURS AS NEEDED 12/06/16   [name] A, FNP  losartan-hydrochlorothiazide (HYZAAR) 100-25 MG tablet Take 1 tablet by mouth daily. 07/05/16   Sharion Balloon, FNP  omeprazole (PRILOSEC) 20 MG capsule Take 1 capsule (20 mg total) by mouth daily. 07/05/16   Sharion Balloon, FNP  promethazine (PHENERGAN) 25 MG tablet Take 0.5-1 tablets (12.5-25 mg total) by mouth every 8 (eight) hours as needed for nausea or  vomiting. 01/15/17   Janora Norlander, DO  Vitamin D, Ergocalciferol, (DRISDOL) 50000 units CAPS capsule Take 1 capsule (50,000 Units total) by mouth every 7 (seven) days. 07/08/16   Sharion Balloon, FNP    Family History Family History  Problem Relation Age of Onset  . Diabetes Other   . Cancer Mother        lung  . Brain cancer Mother        tumor    Social History Social History   Tobacco Use  . Smoking status: Current Every Day Smoker    Packs/day: 1.50    Types: Cigarettes  . Smokeless tobacco: Never Used  Substance Use Topics  . Alcohol use: Yes  . Drug use: No     Allergies   Sulfa antibiotics   Review of Systems Review of Systems  Constitutional: Negative for chills, diaphoresis and fever.  Respiratory: Positive for shortness of breath. Negative for cough.   Cardiovascular: Positive for chest pain. Negative for leg swelling.  Gastrointestinal: Negative for abdominal pain, diarrhea, nausea and vomiting.  Musculoskeletal: Negative for back pain.  Neurological: Positive for light-headedness. Negative for dizziness, weakness and headaches.  All other systems reviewed and are negative.    Physical Exam Updated Vital Signs BP 135/81 (BP Location: Right Arm)   Pulse 80   Temp 98.3 F (36.8 C) (Oral)   Resp (!) 25   LMP 05/31/2014   SpO2 98%   Physical Exam  Constitutional: She appears well-developed and well-nourished. No distress.  HENT:  Head: Normocephalic and atraumatic.  Eyes: Conjunctivae are normal.  Neck: Neck supple.  Cardiovascular: Normal rate, regular rhythm, normal heart sounds and intact distal pulses.  Pulmonary/Chest: Effort normal. No respiratory distress. She has decreased breath sounds.  Patient speaks in full sentences without noted difficulty.  No increased work of breathing.  Abdominal: Soft. There is no tenderness. There is no guarding.  Musculoskeletal: She exhibits no edema or tenderness.  Lower extremities without erythema,  increased warmth, tenderness, or unilateral edema.  Lymphadenopathy:    She has no cervical adenopathy.  Neurological: She is alert.  Skin: Skin is warm and dry. Capillary refill takes less than 2 seconds. She is not diaphoretic.  Psychiatric: She has a normal mood and affect. Her behavior is normal.  Nursing note and vitals reviewed.    ED Treatments / Results  Labs (all labs ordered are listed, but only abnormal results are displayed) Labs Reviewed  BASIC METABOLIC PANEL  CBC  I-STAT TROPONIN, ED  I-STAT BETA HCG BLOOD, ED ([facility], [facility], [facility] ONLY)  I-STAT TROPONIN, ED    EKG  EKG Interpretation  Date/Time:  Tuesday April 01 2017 11:44:30 EST Ventricular Rate:  80 PR Interval:    QRS Duration: 99 QT Interval:  392 QTC Calculation: 453 R Axis:   48 Text Interpretation:  Sinus rhythm Short PR interval Low  voltage, precordial leads No previous tracing Confirmed by Lajean Saver [phone]) on 04/01/2017 12:30:45 PM       Radiology Dg Chest 2 View  Result Date: 04/01/2017 CLINICAL DATA:  Shortness of breath. EXAM: CHEST  2 VIEW COMPARISON:  07/08/2013 . FINDINGS: Mediastinum and hilar structures normal. Mild cardiomegaly. No pulmonary venous congestion. No pleural effusion or pneumothorax. IMPRESSION: No acute cardiopulmonary disease . Electronically Signed   By: Marcello Moores  Register   On: 04/01/2017 12:28    Procedures Procedures (including critical care time)  Medications Ordered in ED Medications  ipratropium-albuterol (DUONEB) 0.5-2.5 (3) MG/3ML nebulizer solution 3 mL (3 mLs Nebulization Given 04/01/17 1330)     Initial Impression / Assessment and Plan / ED Course  I have reviewed the triage vital signs and the nursing notes.  Pertinent labs & imaging results that were available during my care of the patient were reviewed by me and considered in my medical decision making (see chart for details).  Clinical Course as of Apr 01 1550  Tue Apr 01, 2017  1500 Patient  states her pain has resolved. Denies SOB or other symptoms. Lung sounds improved.  Patient shows no increased work of breathing or other signs of distress.  [SJ]  4259 Set up an appointment for patient at [facility] at [facility] on Friday, November 30 at 8:40 AM.  I confirmed with the patient that she would be able to keep this appointment.  [SJ]    Clinical Course User Index [SJ] Joy, Shawn C, PA-C     Patient presents with chest pain beginning today. HEART score is 4, indicating moderate risk for a cardiac event. Wells criteria score is 0, indicating low risk for PE.  Patient is nontoxic appearing, afebrile, not tachycardic, not tachypneic, not hypotensive, maintains SPO2 of 98% on room air, and is in no apparent distress.  Patient was adamant about avoiding admission. She agreed to allow trended troponins and close cardiology follow up.  Delta troponins negative.  Patient pain-free and symptom-free at discharge.  Ambulated without onset of chest pain, breath, or other symptoms or signs of distress.  Maintained SPO2 98% on room air while ambulating.  Patient to follow-up with cardiology.  Strict return precautions discussed.  Patient voices understanding of all instructions and is comfortable discharge.  Findings and plan of care discussed with Lajean Saver, MD. Dr. Ashok Cordia personally evaluated and examined this patient.  Vitals:   04/01/17 1225 04/01/17 1230 04/01/17 1245 04/01/17 1300  BP: 135/81 136/88 139/89 135/89  Pulse: 80 78 75 77  Resp: (!) 25 19 18  (!) 21  Temp:      TempSrc:      SpO2: 98% 96% 98% 97%   Vitals:   04/01/17 1400 04/01/17 1415 04/01/17 1430 04/01/17 1445  BP: 110/89 138/80 136/76 (!) 148/75  Pulse: 88 81 78 78  Resp: (!) 23 (!) 24 (!) 21 18  Temp:      TempSrc:      SpO2: 95% 93%  92%     Final Clinical Impressions(s) / ED Diagnoses   Final diagnoses:  Chest pain, unspecified type    ED Discharge Orders    None       Layla Maw 04/01/17  1551    Lajean Saver, MD 04/02/17 [phone]

## 2017-04-01 NOTE — ED Notes (Signed)
Patient ambulatory to restroom and back with steady gait. Denies dizziness.

## 2017-04-01 NOTE — ED Notes (Signed)
MD at bedside. 

## 2017-04-01 NOTE — ED Triage Notes (Addendum)
Per [facility]: PT to ED from [facility] c/o L sided CP and SOB onset this morning, hx HTN and COPD, not on O2 at home. Patient denies fevers/chills. Patient also reports cold-like symptoms starting last week. UC sent her over here for possible elevation, no elevation with EMS EKG. NSR with occasional PVCs. Pt received 324 ASA at [facility] and 1 SL NTG PTA for CP and elevated BP (162/75). Pt reports CP is a dull ache rated 3/10. Denies SOB at rest. Denies dizziness or lightheadedness.

## 2017-04-01 NOTE — ED Notes (Signed)
Pts oxygen saturation remained at 98% throughout ambulation

## 2017-04-04 ENCOUNTER — Encounter: Payer: Self-pay | Admitting: *Deleted

## 2017-04-04 ENCOUNTER — Ambulatory Visit: Payer: BLUE CROSS/BLUE SHIELD | Admitting: Cardiovascular Disease

## 2017-04-04 ENCOUNTER — Encounter: Payer: Self-pay | Admitting: Cardiovascular Disease

## 2017-04-04 ENCOUNTER — Other Ambulatory Visit: Payer: Self-pay

## 2017-04-04 ENCOUNTER — Telehealth: Payer: Self-pay | Admitting: Cardiovascular Disease

## 2017-04-04 VITALS — BP 124/80 | HR 91 | Ht 62.0 in | Wt 245.0 lb

## 2017-04-04 DIAGNOSIS — Z9289 Personal history of other medical treatment: Secondary | ICD-10-CM

## 2017-04-04 DIAGNOSIS — Z9989 Dependence on other enabling machines and devices: Secondary | ICD-10-CM | POA: Diagnosis not present

## 2017-04-04 DIAGNOSIS — Z720 Tobacco use: Secondary | ICD-10-CM | POA: Diagnosis not present

## 2017-04-04 DIAGNOSIS — G4733 Obstructive sleep apnea (adult) (pediatric): Secondary | ICD-10-CM | POA: Diagnosis not present

## 2017-04-04 DIAGNOSIS — R079 Chest pain, unspecified: Secondary | ICD-10-CM

## 2017-04-04 DIAGNOSIS — I10 Essential (primary) hypertension: Secondary | ICD-10-CM

## 2017-04-04 MED ORDER — NITROGLYCERIN 0.4 MG SL SUBL: 0.4000 mg | SUBLINGUAL_TABLET | SUBLINGUAL | 3 refills | Status: DC | PRN

## 2017-04-04 NOTE — Telephone Encounter (Signed)
Pre-cert Verification for the following procedure   Lexiscan scheduled for 04-25-17 at [facility]

## 2017-04-04 NOTE — Patient Instructions (Signed)
Medication Instructions:   Begin Nitroglycerin as needed for severe chest pain only.  Continue all other medications.    Labwork: none  Testing/Procedures:  Your physician has requested that you have a lexiscan myoview. For further information please visit [email or web address]. Please follow instruction sheet, as given.  Office will contact with results via phone or letter.    Follow-Up: 6 weeks   Any Other Special Instructions Will Be Listed Below (If Applicable).  If you need a refill on your cardiac medications before your next appointment, please call your pharmacy.

## 2017-04-04 NOTE — Progress Notes (Signed)
CARDIOLOGY CONSULT NOTE  Patient ID: Hayley Crawford MRN: [id number] DOB/AGE: 1965/12/19 51 y.o.  Admit date: (Not on file) Primary Physician: Sharion Balloon, FNP Referring Physician: Sharion Balloon, FNP  Reason for Consultation: Chest pain  HPI: Hayley Crawford is a 51 y.o. female who is being seen today for the evaluation of chest pain at the request of Sharion Balloon, FNP.   Past medical history includes hypertension, tobacco abuse, and COPD.  She was evaluated in the ED for chest pain at [facility] on 04/01/17.  I personally reviewed all relevant documentation, labs, and studies.  Basic metabolic panel, CBC, and troponins were normal.  Chest x-ray showed no acute cardiopulmonary disease.  ECG which I personally reviewed demonstrated sinus rhythm with no acute ischemic abnormalities.  She has been having chest pain on and off for the past year.  It occurs about once per week and she rates it at a 4/10.  Severe episodes occur about twice a year and can be incapacitating.  They are primarily located in the upper left side of her chest with radiation down the left arm and associated left arm numbness.  She usually takes a baby aspirin with some relief.  Primarily occurs when she is sitting.  She denies associated shortness of breath, nausea, and vomiting.  His past Tuesday she woke up with a headache.  She then developed lower back pain and then about 8:30 AM she developed chest pain.  She then had some dizziness and blurriness of vision.  She has been smoking 1.5 packs of cigarettes daily for the past 35 years.  She uses CPAP for obstructive sleep apnea.    Allergies  Allergen Reactions  . Sulfa Antibiotics Swelling    "think it caused my throat to swell"    Current Outpatient Medications  Medication Sig Dispense Refill  . albuterol (PROVENTIL HFA;VENTOLIN HFA) 108 (90 Base) MCG/ACT inhaler Inhale 2 puffs into the lungs every 6 (six) hours as needed for wheezing  or shortness of breath. 1 Inhaler 2  . albuterol (PROVENTIL) (2.5 MG/3ML) 0.083% nebulizer solution Take 3 mLs (2.5 mg total) by nebulization every 6 (six) hours as needed for wheezing or shortness of breath. 150 mL 11  . budesonide-formoterol (SYMBICORT) 80-4.5 MCG/ACT inhaler Inhale 2 puffs into the lungs 2 (two) times daily. 1 Inhaler 3  . busPIRone (BUSPAR) 10 MG tablet Take 1 tablet (10 mg total) by mouth 3 (three) times daily as needed. 90 tablet 1  . cyclobenzaprine (FLEXERIL) 10 MG tablet Take 1 tablet (10 mg total) by mouth 3 (three) times daily as needed for muscle spasms. 60 tablet 1  . diclofenac sodium (VOLTAREN) 1 % GEL Apply 2 g topically 4 (four) times daily. 100 g 1  . doxycycline (VIBRA-TABS) 100 MG tablet Take 1 tablet (100 mg total) by mouth 2 (two) times daily. 20 tablet 0  . ibuprofen (ADVIL,MOTRIN) 600 MG tablet TAKE 1 TABLET BY MOUTH EVERY 8 HOURS AS NEEDED 90 tablet 2  . losartan-hydrochlorothiazide (HYZAAR) 100-25 MG tablet Take 1 tablet by mouth daily. 90 tablet 3  . omeprazole (PRILOSEC) 20 MG capsule Take 1 capsule (20 mg total) by mouth daily. 90 capsule 2  . promethazine (PHENERGAN) 25 MG tablet Take 0.5-1 tablets (12.5-25 mg total) by mouth every 8 (eight) hours as needed for nausea or vomiting. 10 tablet 0  . Vitamin D, Ergocalciferol, (DRISDOL) 50000 units CAPS capsule Take 1 capsule (50,000 Units total) by  mouth every 7 (seven) days. 12 capsule 3   No current facility-administered medications for this visit.     Past Medical History:  Diagnosis Date  . B12 deficiency   . COPD (chronic obstructive pulmonary disease) ([place])   . Hyperlipidemia   . Hypertension   . Uterine fibroid     Past Surgical History:  Procedure Laterality Date  . ABDOMINAL HYSTERECTOMY     Partial  . bladder stretching    . CHOLECYSTECTOMY    . HEMORROIDECTOMY    . TENDON REPAIR     right thumb  . tumor removed      Social History   Socioeconomic History  . Marital status:  Divorced    Spouse name: Not on file  . Number of children: Not on file  . Years of education: Not on file  . Highest education level: Not on file  Social Needs  . Financial resource strain: Not on file  . Food insecurity - worry: Not on file  . Food insecurity - inability: Not on file  . Transportation needs - medical: Not on file  . Transportation needs - non-medical: Not on file  Occupational History  . Not on file  Tobacco Use  . Smoking status: Current Every Day Smoker    Packs/day: 1.50    Types: Cigarettes  . Smokeless tobacco: Never Used  Substance and Sexual Activity  . Alcohol use: Yes  . Drug use: No  . Sexual activity: No  Other Topics Concern  . Not on file  Social History Narrative  . Not on file     No family history of premature CAD in 1st degree relatives.  Current Meds  Medication Sig  . albuterol (PROVENTIL HFA;VENTOLIN HFA) 108 (90 Base) MCG/ACT inhaler Inhale 2 puffs into the lungs every 6 (six) hours as needed for wheezing or shortness of breath.  Marland Kitchen albuterol (PROVENTIL) (2.5 MG/3ML) 0.083% nebulizer solution Take 3 mLs (2.5 mg total) by nebulization every 6 (six) hours as needed for wheezing or shortness of breath.  . budesonide-formoterol (SYMBICORT) 80-4.5 MCG/ACT inhaler Inhale 2 puffs into the lungs 2 (two) times daily.  . busPIRone (BUSPAR) 10 MG tablet Take 1 tablet (10 mg total) by mouth 3 (three) times daily as needed.  . cyclobenzaprine (FLEXERIL) 10 MG tablet Take 1 tablet (10 mg total) by mouth 3 (three) times daily as needed for muscle spasms.  . diclofenac sodium (VOLTAREN) 1 % GEL Apply 2 g topically 4 (four) times daily.  Marland Kitchen doxycycline (VIBRA-TABS) 100 MG tablet Take 1 tablet (100 mg total) by mouth 2 (two) times daily.  Marland Kitchen ibuprofen (ADVIL,MOTRIN) 600 MG tablet TAKE 1 TABLET BY MOUTH EVERY 8 HOURS AS NEEDED  . losartan-hydrochlorothiazide (HYZAAR) 100-25 MG tablet Take 1 tablet by mouth daily.  Marland Kitchen omeprazole (PRILOSEC) 20 MG capsule Take  1 capsule (20 mg total) by mouth daily.  . promethazine (PHENERGAN) 25 MG tablet Take 0.5-1 tablets (12.5-25 mg total) by mouth every 8 (eight) hours as needed for nausea or vomiting.  . Vitamin D, Ergocalciferol, (DRISDOL) 50000 units CAPS capsule Take 1 capsule (50,000 Units total) by mouth every 7 (seven) days.      Review of systems complete and found to be negative unless listed above in HPI    Physical exam Blood pressure 124/80, pulse 91, height 5\' 2"  (1.575 m), weight 245 lb (111.1 kg), last menstrual period 05/31/2014, SpO2 97 %. General: NAD Neck: No JVD, no thyromegaly or thyroid nodule.  Lungs: Clear  to auscultation bilaterally with normal respiratory effort. CV: Nondisplaced PMI. Regular rate and rhythm, normal S1/S2, no S3/S4, no murmur.  No peripheral edema.  No carotid bruit.    Abdomen: Soft, nontender, no distention, protuberant.  Skin: Intact without lesions or rashes.  Neurologic: Alert and oriented x 3.  Psych: Normal affect. Extremities: No clubbing or cyanosis.  HEENT: Normal.   ECG: Most recent ECG reviewed.   Labs: Lab Results  Component Value Date/Time   K 3.6 04/01/2017 11:45 AM   BUN 10 04/01/2017 11:45 AM   BUN 12 07/05/2016 03:44 PM   CREATININE 0.72 04/01/2017 11:45 AM   ALT 35 (H) 07/05/2016 03:44 PM   TSH 2.020 07/05/2016 03:44 PM   HGB 13.4 04/01/2017 11:45 AM   HGB 13.4 07/05/2016 03:44 PM     Lipids: Lab Results  Component Value Date/Time   LDLCALC 141 (H) 07/05/2016 03:44 PM   LDLCALC 109 (H) 04/09/2013 03:39 PM   CHOL 217 (H) 07/05/2016 03:44 PM   TRIG 146 07/05/2016 03:44 PM   TRIG 211 (H) 04/09/2013 03:39 PM   HDL 47 07/05/2016 03:44 PM   HDL 43 04/09/2013 03:39 PM        ASSESSMENT AND PLAN:  1.  Chest pain: There are both typical and atypical features for ischemic heart disease.  Risk factors include obesity, hypertension, and tobacco abuse. I will proceed with a nuclear myocardial perfusion imaging study to evaluate for  ischemic heart disease (Lexiscan Myoview). I will also prescribe sublingual nitroglycerin.  2.  Hypertension: Controlled.  No changes to therapy.  3.  Tobacco abuse disorder  4.  Obstructive sleep apnea: Uses CPAP.     Disposition: Follow up in 6 weeks   Signed: Kate Sable, M.D., F.A.C.C.  04/04/2017, 8:49 AM

## 2017-04-25 ENCOUNTER — Encounter (HOSPITAL_BASED_OUTPATIENT_CLINIC_OR_DEPARTMENT_OTHER)
Admission: RE | Admit: 2017-04-25 | Discharge: 2017-04-25 | Disposition: A | Discharge status: Home / Self Care | Payer: BLUE CROSS/BLUE SHIELD | Source: Ambulatory Visit | Attending: Cardiovascular Disease | Admitting: Cardiovascular Disease

## 2017-04-25 ENCOUNTER — Encounter (HOSPITAL_COMMUNITY)
Admission: RE | Admit: 2017-04-25 | Discharge: 2017-04-25 | Disposition: A | Discharge status: Home / Self Care | Payer: BLUE CROSS/BLUE SHIELD | Source: Ambulatory Visit | Attending: Cardiovascular Disease | Admitting: Cardiovascular Disease

## 2017-04-25 ENCOUNTER — Encounter (HOSPITAL_COMMUNITY): Payer: BLUE CROSS/BLUE SHIELD

## 2017-04-25 ENCOUNTER — Other Ambulatory Visit (HOSPITAL_COMMUNITY): Payer: BLUE CROSS/BLUE SHIELD

## 2017-04-25 ENCOUNTER — Encounter (HOSPITAL_COMMUNITY): Payer: Self-pay

## 2017-04-25 DIAGNOSIS — R079 Chest pain, unspecified: Secondary | ICD-10-CM | POA: Insufficient documentation

## 2017-04-25 LAB — NM MYOCAR MULTI W/SPECT W/WALL MOTION / EF
CHL CUP NUCLEAR SDS: 1
CHL CUP NUCLEAR SRS: 1
CHL CUP RESTING HR STRESS: 77 {beats}/min
LHR: 0.68
LV dias vol: 62 mL (ref 46–106)
LV sys vol: 10 mL
NUC STRESS TID: 1.42
Peak HR: 108 {beats}/min
SSS: 2

## 2017-04-25 MED ORDER — TECHNETIUM TC 99M TETROFOSMIN IV KIT
30.0000 | PACK | Freq: Once | INTRAVENOUS | Status: AC | PRN
Start: 2017-04-25 — End: 2017-04-25
  Administered 2017-04-25: 26 via INTRAVENOUS

## 2017-04-25 MED ORDER — REGADENOSON 0.4 MG/5ML IV SOLN
INTRAVENOUS | Status: AC
  Administered 2017-04-25: 0.4 mg via INTRAVENOUS
  Filled 2017-04-25: qty 5

## 2017-04-25 MED ORDER — SODIUM CHLORIDE 0.9% FLUSH
INTRAVENOUS | Status: AC
  Filled 2017-04-25: qty 180

## 2017-04-25 MED ORDER — TECHNETIUM TC 99M TETROFOSMIN IV KIT
10.0000 | PACK | Freq: Once | INTRAVENOUS | Status: AC | PRN
  Administered 2017-04-25: 8.5 via INTRAVENOUS

## 2017-04-25 MED ORDER — SODIUM CHLORIDE 0.9% FLUSH
INTRAVENOUS | Status: AC
  Administered 2017-04-25: 10 mL via INTRAVENOUS
  Filled 2017-04-25: qty 10

## 2017-05-16 ENCOUNTER — Encounter: Payer: Self-pay | Admitting: Cardiovascular Disease

## 2017-05-16 ENCOUNTER — Ambulatory Visit: Payer: BLUE CROSS/BLUE SHIELD | Admitting: Cardiovascular Disease

## 2017-05-16 VITALS — BP 142/78 | HR 81 | Ht 62.0 in | Wt 251.0 lb

## 2017-05-16 DIAGNOSIS — G4733 Obstructive sleep apnea (adult) (pediatric): Secondary | ICD-10-CM | POA: Diagnosis not present

## 2017-05-16 DIAGNOSIS — Z720 Tobacco use: Secondary | ICD-10-CM

## 2017-05-16 DIAGNOSIS — I10 Essential (primary) hypertension: Secondary | ICD-10-CM

## 2017-05-16 DIAGNOSIS — R079 Chest pain, unspecified: Secondary | ICD-10-CM | POA: Diagnosis not present

## 2017-05-16 DIAGNOSIS — Z9989 Dependence on other enabling machines and devices: Secondary | ICD-10-CM

## 2017-05-16 NOTE — Progress Notes (Signed)
SUBJECTIVE: The patient returns for follow-up after undergoing cardiovascular testing performed for the evaluation of chest pain.  Nuclear stress test on 04/25/17 was normal, LVEF 83%.  She has had 2 episodes of chest pain since her last visit with me.  Neither required nitroglycerin.  She was upset both times and then had chest pain.  The second episode occurred while she was at work and she took a buspirone and calm down and felt much better.     Review of Systems: As per "subjective", otherwise negative.  Allergies  Allergen Reactions  . Sulfa Antibiotics Swelling    "think it caused my throat to swell"    Current Outpatient Medications  Medication Sig Dispense Refill  . albuterol (PROVENTIL HFA;VENTOLIN HFA) 108 (90 Base) MCG/ACT inhaler Inhale 2 puffs into the lungs every 6 (six) hours as needed for wheezing or shortness of breath. 1 Inhaler 2  . albuterol (PROVENTIL) (2.5 MG/3ML) 0.083% nebulizer solution Take 3 mLs (2.5 mg total) by nebulization every 6 (six) hours as needed for wheezing or shortness of breath. 150 mL 11  . budesonide-formoterol (SYMBICORT) 80-4.5 MCG/ACT inhaler Inhale 2 puffs into the lungs 2 (two) times daily. 1 Inhaler 3  . busPIRone (BUSPAR) 10 MG tablet Take 1 tablet (10 mg total) by mouth 3 (three) times daily as needed. 90 tablet 1  . cyclobenzaprine (FLEXERIL) 10 MG tablet Take 1 tablet (10 mg total) by mouth 3 (three) times daily as needed for muscle spasms. 60 tablet 1  . diclofenac sodium (VOLTAREN) 1 % GEL Apply 2 g topically 4 (four) times daily. 100 g 1  . ibuprofen (ADVIL,MOTRIN) 600 MG tablet TAKE 1 TABLET BY MOUTH EVERY 8 HOURS AS NEEDED 90 tablet 2  . losartan-hydrochlorothiazide (HYZAAR) 100-25 MG tablet Take 1 tablet by mouth daily. 90 tablet 3  . nitroGLYCERIN (NITROSTAT) 0.4 MG SL tablet Place 1 tablet (0.4 mg total) under the tongue every 5 (five) minutes as needed for chest pain. 25 tablet 3  . omeprazole (PRILOSEC) 20 MG capsule  Take 1 capsule (20 mg total) by mouth daily. 90 capsule 2  . promethazine (PHENERGAN) 25 MG tablet Take 0.5-1 tablets (12.5-25 mg total) by mouth every 8 (eight) hours as needed for nausea or vomiting. 10 tablet 0  . Vitamin D, Ergocalciferol, (DRISDOL) 50000 units CAPS capsule Take 1 capsule (50,000 Units total) by mouth every 7 (seven) days. 12 capsule 3   No current facility-administered medications for this visit.     Past Medical History:  Diagnosis Date  . B12 deficiency   . COPD (chronic obstructive pulmonary disease) ([place])   . Hyperlipidemia   . Hypertension   . Uterine fibroid     Past Surgical History:  Procedure Laterality Date  . ABDOMINAL HYSTERECTOMY     Partial  . bladder stretching    . CHOLECYSTECTOMY    . HEMORROIDECTOMY    . TENDON REPAIR     right thumb  . tumor removed      Social History   Socioeconomic History  . Marital status: Single    Spouse name: Not on file  . Number of children: Not on file  . Years of education: Not on file  . Highest education level: Not on file  Social Needs  . Financial resource strain: Not on file  . Food insecurity - worry: Not on file  . Food insecurity - inability: Not on file  . Transportation needs - medical: Not on file  .  Transportation needs - non-medical: Not on file  Occupational History  . Not on file  Tobacco Use  . Smoking status: Current Every Day Smoker    Packs/day: 1.50    Types: Cigarettes  . Smokeless tobacco: Never Used  Substance and Sexual Activity  . Alcohol use: Yes  . Drug use: No  . Sexual activity: No  Other Topics Concern  . Not on file  Social History Narrative  . Not on file     Vitals:   05/16/17 1546  BP: (!) 142/78  Pulse: 81  SpO2: 98%  Weight: 251 lb (113.9 kg)  Height: 5\' 2"  (1.575 m)    Wt Readings from Last 3 Encounters:  05/16/17 251 lb (113.9 kg)  04/04/17 245 lb (111.1 kg)  01/14/17 244 lb (110.7 kg)     PHYSICAL EXAM General: NAD HEENT:  Normal. Neck: No JVD, no thyromegaly. Lungs: Clear to auscultation bilaterally with normal respiratory effort. CV: Regular rate and rhythm, normal S1/S2, no S3/S4, no murmur. No pretibial or periankle edema.  No carotid bruit.   Abdomen: Soft, nontender, no distention.  Neurologic: Alert and oriented.  Psych: Normal affect. Skin: Normal. Musculoskeletal: No gross deformities.    ECG: Most recent ECG reviewed.   Labs: Lab Results  Component Value Date/Time   K 3.6 04/01/2017 11:45 AM   BUN 10 04/01/2017 11:45 AM   BUN 12 07/05/2016 03:44 PM   CREATININE 0.72 04/01/2017 11:45 AM   ALT 35 (H) 07/05/2016 03:44 PM   TSH 2.020 07/05/2016 03:44 PM   HGB 13.4 04/01/2017 11:45 AM   HGB 13.4 07/05/2016 03:44 PM     Lipids: Lab Results  Component Value Date/Time   LDLCALC 141 (H) 07/05/2016 03:44 PM   LDLCALC 109 (H) 04/09/2013 03:39 PM   CHOL 217 (H) 07/05/2016 03:44 PM   TRIG 146 07/05/2016 03:44 PM   TRIG 211 (H) 04/09/2013 03:39 PM   HDL 47 07/05/2016 03:44 PM   HDL 43 04/09/2013 03:39 PM       ASSESSMENT AND PLAN: 1.  Chest pain: Normal nuclear stress test as noted above.  Symptoms may be related to anxiety and stress as she did experience symptom relief with buspirone. No further cardiac testing is indicated at this time.  2.  Hypertension: Mildly elevated.  No changes to therapy at this time.  3.  Tobacco abuse disorder  4.  Obstructive sleep apnea: Uses CPAP.      Disposition: Follow up as needed   Kate Sable, M.D., F.A.C.C.

## 2017-05-16 NOTE — Patient Instructions (Signed)
Medication Instructions:  Continue all other medications.    Labwork: none  Testing/Procedures: none  Follow-Up: As needed.  Any Other Special Instructions Will Be Listed Below (If Applicable).  If you need a refill on your cardiac medications before your next appointment, please call your pharmacy.

## 2017-05-28 ENCOUNTER — Other Ambulatory Visit: Payer: Self-pay | Admitting: Family Medicine

## 2017-05-28 DIAGNOSIS — Z1231 Encounter for screening mammogram for malignant neoplasm of breast: Secondary | ICD-10-CM

## 2017-07-08 ENCOUNTER — Encounter: Payer: Self-pay | Admitting: Family

## 2017-07-09 ENCOUNTER — Ambulatory Visit (HOSPITAL_COMMUNITY): Payer: BLUE CROSS/BLUE SHIELD

## 2017-07-11 ENCOUNTER — Ambulatory Visit (INDEPENDENT_AMBULATORY_CARE_PROVIDER_SITE_OTHER): Payer: BLUE CROSS/BLUE SHIELD | Admitting: Family

## 2017-07-11 ENCOUNTER — Encounter: Payer: Self-pay | Admitting: Family

## 2017-07-11 ENCOUNTER — Encounter (HOSPITAL_COMMUNITY): Payer: Self-pay

## 2017-07-11 ENCOUNTER — Ambulatory Visit (HOSPITAL_COMMUNITY)
Admission: RE | Admit: 2017-07-11 | Discharge: 2017-07-11 | Disposition: A | Discharge status: Home / Self Care | Payer: BLUE CROSS/BLUE SHIELD | Source: Ambulatory Visit | Attending: Family Medicine | Admitting: Family Medicine

## 2017-07-11 VITALS — BP 120/72 | HR 92 | Temp 98.2°F | Ht 62.0 in | Wt 252.4 lb

## 2017-07-11 DIAGNOSIS — D509 Iron deficiency anemia, unspecified: Secondary | ICD-10-CM | POA: Diagnosis not present

## 2017-07-11 DIAGNOSIS — E8881 Metabolic syndrome: Secondary | ICD-10-CM

## 2017-07-11 DIAGNOSIS — Z6841 Body Mass Index (BMI) 40.0 and over, adult: Secondary | ICD-10-CM

## 2017-07-11 DIAGNOSIS — M1711 Unilateral primary osteoarthritis, right knee: Secondary | ICD-10-CM

## 2017-07-11 DIAGNOSIS — I10 Essential (primary) hypertension: Secondary | ICD-10-CM | POA: Diagnosis not present

## 2017-07-11 DIAGNOSIS — J449 Chronic obstructive pulmonary disease, unspecified: Secondary | ICD-10-CM

## 2017-07-11 DIAGNOSIS — E559 Vitamin D deficiency, unspecified: Secondary | ICD-10-CM

## 2017-07-11 DIAGNOSIS — Z0000 Encounter for general adult medical examination without abnormal findings: Secondary | ICD-10-CM

## 2017-07-11 DIAGNOSIS — Z1231 Encounter for screening mammogram for malignant neoplasm of breast: Secondary | ICD-10-CM

## 2017-07-11 DIAGNOSIS — Z720 Tobacco use: Secondary | ICD-10-CM

## 2017-07-11 DIAGNOSIS — E538 Deficiency of other specified B group vitamins: Secondary | ICD-10-CM

## 2017-07-11 DIAGNOSIS — K219 Gastro-esophageal reflux disease without esophagitis: Secondary | ICD-10-CM

## 2017-07-11 DIAGNOSIS — E785 Hyperlipidemia, unspecified: Secondary | ICD-10-CM

## 2017-07-11 DIAGNOSIS — F411 Generalized anxiety disorder: Secondary | ICD-10-CM

## 2017-07-11 DIAGNOSIS — F331 Major depressive disorder, recurrent, moderate: Secondary | ICD-10-CM

## 2017-07-11 MED ORDER — PROMETHAZINE HCL 25 MG PO TABS: 12.5000 mg | ORAL_TABLET | Freq: Three times a day (TID) | ORAL | 0 refills | Status: DC | PRN

## 2017-07-11 MED ORDER — CYCLOBENZAPRINE HCL 10 MG PO TABS: 10.0000 mg | ORAL_TABLET | Freq: Three times a day (TID) | ORAL | 1 refills | Status: DC | PRN

## 2017-07-11 MED ORDER — MELOXICAM 7.5 MG PO TABS: 7.5000 mg | ORAL_TABLET | Freq: Every day | ORAL | 1 refills | Status: DC

## 2017-07-11 NOTE — Patient Instructions (Signed)

## 2017-07-11 NOTE — Progress Notes (Signed)
Subjective:    Patient ID: Hayley Crawford, female    DOB: December 29, 1965, 52 y.o.   MRN: [id number]  Pt presents to the office today for CPE without pap.  Hypertension  This is a chronic problem. The current episode started more than 1 year ago. The problem has been resolved since onset. The problem is controlled. Associated symptoms include anxiety and malaise/fatigue. Pertinent negatives include no peripheral edema or shortness of breath. Risk factors for coronary artery disease include dyslipidemia, obesity, sedentary lifestyle, smoking/tobacco exposure and family history. The current treatment provides moderate improvement. There is no history of CAD/MI, CVA or heart failure.  Depression         This is a chronic problem.  The current episode started more than 1 year ago.   The onset quality is gradual.   The problem occurs intermittently.  The problem has been waxing and waning since onset.  Associated symptoms include irritable, decreased interest and sad.  Associated symptoms include no helplessness and no hopelessness.  Compliance with treatment is good.  Previous treatment provided moderate relief.  Past medical history includes anxiety.   Anxiety  Presents for follow-up visit. Symptoms include excessive worry, irritability and nervous/anxious behavior. Patient reports no depressed mood or shortness of breath. Symptoms occur most days. The quality of sleep is good.   Her past medical history is significant for anemia.  Hyperlipidemia  This is a chronic problem. The current episode started more than 1 year ago. The problem is uncontrolled. Recent lipid tests were reviewed and are high. Exacerbating diseases include obesity. Pertinent negatives include no shortness of breath. Current antihyperlipidemic treatment includes statins and diet change. The current treatment provides no improvement of lipids. Risk factors for coronary artery disease include dyslipidemia, family history, hypertension,  obesity and a sedentary lifestyle.  Anemia  Presents for follow-up visit. Symptoms include malaise/fatigue. There is no history of heart failure.  Gastroesophageal Reflux  She complains of heartburn and a hoarse voice. She reports no belching, no coughing or no wheezing. This is a chronic problem. The current episode started more than 1 year ago. The problem occurs occasionally. The symptoms are aggravated by certain foods. Risk factors include smoking/tobacco exposure and obesity. She has tried a PPI for the symptoms. The treatment provided significant relief.  Breathing Problem  She complains of hoarse voice. There is no cough, difficulty breathing, shortness of breath or wheezing. Primary symptoms comments: COPD . This is a chronic problem. The current episode started more than 1 year ago. The problem occurs intermittently. The problem has been waxing and waning. Associated symptoms include heartburn and malaise/fatigue. Pertinent negatives include no ear congestion or ear pain. She reports moderate improvement on treatment. Her symptoms are not alleviated by rest. Her past medical history is significant for COPD.  Arthritis  Presents for follow-up visit. She complains of pain and stiffness. Affected locations include the right knee and left knee. Her pain is at a severity of 3/10.  Metabolic Syndrome PT states she has gained several pounds over the last few months. States she is not as active during the winter months.     Review of Systems  Constitutional: Positive for irritability and malaise/fatigue.  HENT: Positive for hoarse voice. Negative for ear pain.   Respiratory: Negative for cough, shortness of breath and wheezing.   Gastrointestinal: Positive for heartburn.  Musculoskeletal: Positive for arthralgias, arthritis and stiffness.  Psychiatric/Behavioral: Positive for depression. The patient is nervous/anxious.   All other systems reviewed and  are negative.      Objective:    Physical Exam  Constitutional: She is oriented to person, place, and time. She appears well-developed and well-nourished. She is irritable. No distress.  HENT:  Head: Normocephalic and atraumatic.  Right Ear: External ear normal.  Left Ear: External ear normal.  Nose: Nose normal.  Mouth/Throat: Oropharynx is clear and moist.  Eyes: Pupils are equal, round, and reactive to light.  Neck: Normal range of motion. Neck supple. No thyromegaly present.  Cardiovascular: Normal rate, regular rhythm, normal heart sounds and intact distal pulses.  No murmur heard. Pulmonary/Chest: Effort normal and breath sounds normal. No respiratory distress. She has no wheezes.  Abdominal: Soft. Bowel sounds are normal. She exhibits no distension. There is no tenderness.  Musculoskeletal: Normal range of motion. She exhibits tenderness (mild right knee with extension). She exhibits no edema.  Neurological: She is alert and oriented to person, place, and time.  Skin: Skin is warm and dry.  Psychiatric: She has a normal mood and affect. Her behavior is normal. Judgment and thought content normal.  Vitals reviewed.   BP 120/72   Pulse 92   Temp 98.2 F (36.8 C) (Oral)   Ht '5\' 2"'  (1.575 m)   Wt 252 lb 6.4 oz (114.5 kg)   LMP 05/31/2014   BMI 46.16 kg/m      Assessment & Plan:  1. Essential hypertension - CMP14+EGFR  2. Chronic obstructive pulmonary disease, unspecified COPD type ([place]) - CMP14+EGFR  3. Gastroesophageal reflux disease, esophagitis presence not specified - CMP14+EGFR  4. GAD (generalized anxiety disorder) - CMP14+EGFR  5. Moderate episode of recurrent major depressive disorder (HCC)  - CMP14+EGFR  6. Hyperlipidemia, unspecified hyperlipidemia type - CMP14+EGFR - Lipid panel  7. Vitamin B 12 deficiency - Anemia Profile B - CMP14+EGFR  8. Morbid obesity with BMI of 40.0-44.9, adult (HCC)  - ELT53+UYEB  9. Metabolic syndrome - XID56+YSHU  10. Iron deficiency anemia,  unspecified iron deficiency anemia type - Anemia Profile B - CMP14+EGFR  11. Vitamin D deficiency  - CMP14+EGFR - VITAMIN D 25 Hydroxy (Vit-D Deficiency, Fractures)  12. Tobacco user - CMP14+EGFR  13. Annual physical exam - Anemia Profile B - CMP14+EGFR - Lipid panel - TSH - VITAMIN D 25 Hydroxy (Vit-D Deficiency, Fractures)  14. Primary osteoarthritis of right knee  - meloxicam (MOBIC) 7.5 MG tablet; Take 1 tablet (7.5 mg total) by mouth daily.  Dispense: 90 tablet; Refill: 1   Continue all meds Labs pending Health Maintenance reviewed Diet and exercise encouraged RTO 6 months   [name], FNP

## 2017-07-12 LAB — ANEMIA PROFILE B
BASOS: 0 %
Basophils Absolute: 0 10*3/uL (ref 0.0–0.2)
EOS (ABSOLUTE): 0.2 10*3/uL (ref 0.0–0.4)
EOS: 3 %
Ferritin: 55 ng/mL (ref 15–150)
Folate: 5.5 ng/mL (ref >3.0)
HEMOGLOBIN: 13.6 g/dL (ref 11.1–15.9)
Hematocrit: 41 % (ref 34.0–46.6)
IMMATURE GRANS (ABS): 0 10*3/uL (ref 0.0–0.1)
IMMATURE GRANULOCYTES: 0 %
IRON SATURATION: 25 % (ref 15–55)
Iron: 87 ug/dL (ref 27–159)
LYMPHS: 20 %
Lymphocytes Absolute: 1.7 10*3/uL (ref 0.7–3.1)
MCH: 32.2 pg (ref 26.6–33.0)
MCHC: 33.2 g/dL (ref 31.5–35.7)
MCV: 97 fL (ref 79–97)
MONOCYTES: 5 %
MONOS ABS: 0.4 10*3/uL (ref 0.1–0.9)
NEUTROS PCT: 72 %
Neutrophils Absolute: 6.1 10*3/uL (ref 1.4–7.0)
Platelets: 296 10*3/uL (ref 150–379)
RBC: 4.22 x10E6/uL (ref 3.77–5.28)
RDW: 13.3 % (ref 12.3–15.4)
RETIC CT PCT: 1.8 % (ref 0.6–2.6)
TIBC: 348 ug/dL (ref 250–450)
UIBC: 261 ug/dL (ref 131–425)
Vitamin B-12: 194 pg/mL — ABNORMAL LOW (ref 232–1245)
WBC: 8.4 10*3/uL (ref 3.4–10.8)

## 2017-07-12 LAB — CMP14+EGFR
ALT: 29 IU/L (ref 0–32)
AST: 21 IU/L (ref 0–40)
Albumin/Globulin Ratio: 1.3 (ref 1.2–2.2)
Albumin: 3.8 g/dL (ref 3.5–5.5)
Alkaline Phosphatase: 93 IU/L (ref 39–117)
BUN/Creatinine Ratio: 18 (ref 9–23)
BUN: 13 mg/dL (ref 6–24)
Bilirubin Total: 0.3 mg/dL (ref 0.0–1.2)
CALCIUM: 9.4 mg/dL (ref 8.7–10.2)
CO2: 24 mmol/L (ref 20–29)
CREATININE: 0.73 mg/dL (ref 0.57–1.00)
Chloride: 104 mmol/L (ref 96–106)
GFR, EST AFRICAN AMERICAN: 108 mL/min/{1.73_m2} (ref >59)
GFR, EST NON AFRICAN AMERICAN: 94 mL/min/{1.73_m2} (ref >59)
GLOBULIN, TOTAL: 3 g/dL (ref 1.5–4.5)
Glucose: 98 mg/dL (ref 65–99)
Potassium: 3.8 mmol/L (ref 3.5–5.2)
Sodium: 143 mmol/L (ref 134–144)
TOTAL PROTEIN: 6.8 g/dL (ref 6.0–8.5)

## 2017-07-12 LAB — TSH: TSH: 1.17 u[IU]/mL (ref 0.450–4.500)

## 2017-07-12 LAB — LIPID PANEL
CHOL/HDL RATIO: 4.9 ratio — AB (ref 0.0–4.4)
Cholesterol, Total: 221 mg/dL — ABNORMAL HIGH (ref 100–199)
HDL: 45 mg/dL (ref >39)
Triglycerides: 465 mg/dL — ABNORMAL HIGH (ref 0–149)

## 2017-07-12 LAB — VITAMIN D 25 HYDROXY (VIT D DEFICIENCY, FRACTURES): Vit D, 25-Hydroxy: 12.6 ng/mL — ABNORMAL LOW (ref 30.0–100.0)

## 2017-07-14 ENCOUNTER — Other Ambulatory Visit: Payer: Self-pay | Admitting: Family

## 2017-07-14 DIAGNOSIS — E538 Deficiency of other specified B group vitamins: Secondary | ICD-10-CM

## 2017-07-14 MED ORDER — VITAMIN D (ERGOCALCIFEROL) 1.25 MG (50000 UNIT) PO CAPS: 50000.0000 [IU] | ORAL_CAPSULE | ORAL | 3 refills | Status: DC

## 2017-07-14 MED ORDER — CYANOCOBALAMIN 1000 MCG/ML IJ SOLN
1000.0000 ug | INTRAMUSCULAR | Status: DC
  Administered 2018-01-08 – 2018-05-12 (×5): 1000 ug via INTRAMUSCULAR

## 2017-07-14 MED ORDER — ATORVASTATIN CALCIUM 20 MG PO TABS: 20.0000 mg | ORAL_TABLET | Freq: Every day | ORAL | 11 refills | Status: DC

## 2017-09-09 ENCOUNTER — Ambulatory Visit: Payer: Self-pay | Admitting: Family

## 2017-09-12 ENCOUNTER — Encounter: Payer: Self-pay | Admitting: Family

## 2017-10-03 DIAGNOSIS — Z6841 Body Mass Index (BMI) 40.0 and over, adult: Secondary | ICD-10-CM | POA: Diagnosis not present

## 2017-10-03 DIAGNOSIS — K529 Noninfective gastroenteritis and colitis, unspecified: Secondary | ICD-10-CM | POA: Diagnosis not present

## 2017-10-07 ENCOUNTER — Other Ambulatory Visit: Payer: Self-pay | Admitting: Family

## 2017-10-07 ENCOUNTER — Telehealth: Payer: Self-pay | Admitting: Family

## 2017-10-07 DIAGNOSIS — M5441 Lumbago with sciatica, right side: Secondary | ICD-10-CM

## 2017-10-07 DIAGNOSIS — I10 Essential (primary) hypertension: Secondary | ICD-10-CM

## 2017-10-07 DIAGNOSIS — K219 Gastro-esophageal reflux disease without esophagitis: Secondary | ICD-10-CM

## 2017-10-07 MED ORDER — IBUPROFEN 600 MG PO TABS: 600.0000 mg | ORAL_TABLET | Freq: Three times a day (TID) | ORAL | 2 refills | Status: DC | PRN

## 2017-10-07 NOTE — Telephone Encounter (Signed)
Prescription sent to pharmacy.

## 2018-01-08 ENCOUNTER — Ambulatory Visit: Payer: BLUE CROSS/BLUE SHIELD | Admitting: Family

## 2018-01-08 ENCOUNTER — Encounter: Payer: Self-pay | Admitting: Family

## 2018-01-08 VITALS — BP 151/96 | HR 82 | Temp 97.5°F | Ht 62.0 in | Wt 252.0 lb

## 2018-01-08 DIAGNOSIS — L03116 Cellulitis of left lower limb: Secondary | ICD-10-CM | POA: Diagnosis not present

## 2018-01-08 DIAGNOSIS — E538 Deficiency of other specified B group vitamins: Secondary | ICD-10-CM

## 2018-01-08 MED ORDER — CEPHALEXIN 500 MG PO CAPS: 500.0000 mg | ORAL_CAPSULE | Freq: Three times a day (TID) | ORAL | 0 refills | Status: DC

## 2018-01-08 NOTE — Patient Instructions (Signed)

## 2018-01-08 NOTE — Progress Notes (Signed)
   Subjective:    Patient ID: Hayley Crawford, female    DOB: 09-29-1965, 52 y.o.   MRN: [id number]  Chief Complaint  Patient presents with  . red,painful lump on left lower leg    Rash  This is a new problem. The current episode started 1 to 4 weeks ago. The problem has been gradually worsening since onset. The affected locations include the left lower leg. The rash is characterized by redness, swelling, pain and itchiness. It is unknown if there was an exposure to a precipitant. Pertinent negatives include no congestion, diarrhea, joint pain, shortness of breath or sore throat. Past treatments include nothing. The treatment provided no relief.      Review of Systems  HENT: Negative for congestion and sore throat.   Respiratory: Negative for shortness of breath.   Gastrointestinal: Negative for diarrhea.  Musculoskeletal: Negative for joint pain.  Skin: Positive for rash.  All other systems reviewed and are negative.      Objective:   Physical Exam  Constitutional: She appears well-developed and well-nourished. No distress.  HENT:  Head: Normocephalic.  Eyes: Pupils are equal, round, and reactive to light.  Neck: Normal range of motion. Neck supple. No thyromegaly present.  Cardiovascular: Normal rate, regular rhythm, normal heart sounds and intact distal pulses.  No murmur heard. Pulmonary/Chest: Effort normal and breath sounds normal. No respiratory distress. She has no wheezes.  Abdominal: Soft. Bowel sounds are normal. She exhibits no distension. There is no tenderness.  Musculoskeletal: Normal range of motion. She exhibits no edema or tenderness.  Neurological: She is alert. She has normal reflexes. No cranial nerve deficit.  Skin: Skin is warm and dry. Rash noted.  7X6cm erythemas, warm hard rash on left lateral leg  Psychiatric: She has a normal mood and affect. Her behavior is normal. Judgment and thought content normal.  Vitals reviewed.     BP (!) 151/96   Pulse  82   Temp (!) 97.5 F (36.4 C) (Oral)   Ht 5\' 2"  (1.575 m)   Wt 252 lb (114.3 kg)   LMP 05/31/2014   BMI 46.09 kg/m      Assessment & Plan:  Hayley Crawford comes in today with chief complaint of red,painful lump on left lower leg   Diagnosis and orders addressed:  1. Cellulitis of left lower extremity Keep leg elevated Area marked and report if erythemas increased   Force fluids RTO if symptoms worsen or do not improve  - cephALEXin (KEFLEX) 500 MG capsule; Take 1 capsule (500 mg total) by mouth 3 (three) times daily.  Dispense: 21 capsule; Refill: 0   [name], FNP

## 2018-01-13 ENCOUNTER — Other Ambulatory Visit: Payer: Self-pay | Admitting: Family

## 2018-01-13 DIAGNOSIS — K219 Gastro-esophageal reflux disease without esophagitis: Secondary | ICD-10-CM

## 2018-01-13 DIAGNOSIS — I10 Essential (primary) hypertension: Secondary | ICD-10-CM

## 2018-02-09 ENCOUNTER — Ambulatory Visit (INDEPENDENT_AMBULATORY_CARE_PROVIDER_SITE_OTHER): Payer: BLUE CROSS/BLUE SHIELD | Admitting: *Deleted

## 2018-02-09 DIAGNOSIS — E538 Deficiency of other specified B group vitamins: Secondary | ICD-10-CM | POA: Diagnosis not present

## 2018-02-09 NOTE — Patient Instructions (Signed)
Cyanocobalamin, Vitamin B12 injection What is this medicine? CYANOCOBALAMIN (sye an oh koe BAL a min) is a man made form of vitamin B12. Vitamin B12 is used in the growth of healthy blood cells, nerve cells, and proteins in the body. It also helps with the metabolism of fats and carbohydrates. This medicine is used to treat people who can not absorb vitamin B12. This medicine may be used for other purposes; ask your health care provider or pharmacist if you have questions. COMMON BRAND NAME(S): B-12 Compliance Kit, B-12 Injection Kit, Cyomin, LA-12, Nutri-Twelve, Physicians EZ Use B-12, Primabalt What should I tell my health care provider before I take this medicine? They need to know if you have any of these conditions: -kidney disease -Leber's disease -megaloblastic anemia -an unusual or allergic reaction to cyanocobalamin, cobalt, other medicines, foods, dyes, or preservatives -pregnant or trying to get pregnant -breast-feeding How should I use this medicine? This medicine is injected into a muscle or deeply under the skin. It is usually given by a health care professional in a clinic or doctor's office. However, your doctor may teach you how to inject yourself. Follow all instructions. Talk to your pediatrician regarding the use of this medicine in children. Special care may be needed. Overdosage: If you think you have taken too much of this medicine contact a poison control center or emergency room at once. NOTE: This medicine is only for you. Do not share this medicine with others. What if I miss a dose? If you are given your dose at a clinic or doctor's office, call to reschedule your appointment. If you give your own injections and you miss a dose, take it as soon as you can. If it is almost time for your next dose, take only that dose. Do not take double or extra doses. What may interact with this medicine? -colchicine -heavy alcohol intake This list may not describe all possible  interactions. Give your health care provider a list of all the medicines, herbs, non-prescription drugs, or dietary supplements you use. Also tell them if you smoke, drink alcohol, or use illegal drugs. Some items may interact with your medicine. What should I watch for while using this medicine? Visit your doctor or health care professional regularly. You may need blood work done while you are taking this medicine. You may need to follow a special diet. Talk to your doctor. Limit your alcohol intake and avoid smoking to get the best benefit. What side effects may I notice from receiving this medicine? Side effects that you should report to your doctor or health care professional as soon as possible: -allergic reactions like skin rash, itching or hives, swelling of the face, lips, or tongue -blue tint to skin -chest tightness, pain -difficulty breathing, wheezing -dizziness -red, swollen painful area on the leg Side effects that usually do not require medical attention (report to your doctor or health care professional if they continue or are bothersome): -diarrhea -headache This list may not describe all possible side effects. Call your doctor for medical advice about side effects. You may report side effects to FDA at 1-800-FDA-1088. Where should I keep my medicine? Keep out of the reach of children. Store at room temperature between 15 and 30 degrees C (59 and 85 degrees F). Protect from light. Throw away any unused medicine after the expiration date. NOTE: This sheet is a summary. It may not cover all possible information. If you have questions about this medicine, talk to your doctor, pharmacist, or   health care provider.  2018 Elsevier/Gold Standard (2007-08-03 22:10:20)  

## 2018-02-09 NOTE — Progress Notes (Signed)
Vitamin b12 injection given and tolerated well.  

## 2018-02-12 DIAGNOSIS — H5203 Hypermetropia, bilateral: Secondary | ICD-10-CM | POA: Diagnosis not present

## 2018-02-12 DIAGNOSIS — H5210 Myopia, unspecified eye: Secondary | ICD-10-CM | POA: Diagnosis not present

## 2018-03-11 ENCOUNTER — Ambulatory Visit (INDEPENDENT_AMBULATORY_CARE_PROVIDER_SITE_OTHER): Payer: BLUE CROSS/BLUE SHIELD | Admitting: *Deleted

## 2018-03-11 DIAGNOSIS — E538 Deficiency of other specified B group vitamins: Secondary | ICD-10-CM

## 2018-03-11 NOTE — Progress Notes (Signed)
Pt given Cyanocobalamin inj Tolerated well 

## 2018-03-25 ENCOUNTER — Telehealth: Payer: Self-pay | Admitting: Family

## 2018-04-10 ENCOUNTER — Ambulatory Visit (INDEPENDENT_AMBULATORY_CARE_PROVIDER_SITE_OTHER): Payer: BLUE CROSS/BLUE SHIELD | Admitting: *Deleted

## 2018-04-10 DIAGNOSIS — E538 Deficiency of other specified B group vitamins: Secondary | ICD-10-CM | POA: Diagnosis not present

## 2018-04-10 NOTE — Progress Notes (Signed)
Pt given cyanocobalamin inj Tolerated well 

## 2018-04-30 ENCOUNTER — Telehealth: Payer: Self-pay | Admitting: *Deleted

## 2018-04-30 MED ORDER — LOSARTAN POTASSIUM 100 MG PO TABS: 100.0000 mg | ORAL_TABLET | Freq: Every day | ORAL | 3 refills | Status: DC

## 2018-04-30 MED ORDER — HYDROCHLOROTHIAZIDE 25 MG PO TABS: 25.0000 mg | ORAL_TABLET | Freq: Every day | ORAL | 3 refills | Status: DC

## 2018-04-30 NOTE — Telephone Encounter (Signed)
Pt aware.

## 2018-04-30 NOTE — Telephone Encounter (Signed)
Prescription sent to pharmacy.

## 2018-04-30 NOTE — Telephone Encounter (Signed)
Fax from [facility] Losartan HCTZ 100/25 mg tab on backorder Can you dispense seperately

## 2018-05-11 ENCOUNTER — Telehealth: Payer: Self-pay | Admitting: Family

## 2018-05-11 NOTE — Telephone Encounter (Signed)
She is due for an annual physical in March. We could do this at that visit. Or is this for something that she is experiencing acutely.

## 2018-05-11 NOTE — Telephone Encounter (Signed)
Pt has become verbally abusive Feels like it's related to hormones appt scheduled for evaluation

## 2018-05-12 ENCOUNTER — Ambulatory Visit (INDEPENDENT_AMBULATORY_CARE_PROVIDER_SITE_OTHER): Payer: BLUE CROSS/BLUE SHIELD | Admitting: *Deleted

## 2018-05-12 DIAGNOSIS — E538 Deficiency of other specified B group vitamins: Secondary | ICD-10-CM

## 2018-05-12 NOTE — Progress Notes (Signed)
Pt given cyanocobalamin inj Tolerated well 

## 2018-05-14 ENCOUNTER — Ambulatory Visit: Payer: BLUE CROSS/BLUE SHIELD | Admitting: Family

## 2018-05-14 ENCOUNTER — Encounter: Payer: Self-pay | Admitting: Family

## 2018-05-14 VITALS — BP 114/73 | HR 84 | Temp 97.2°F | Ht 62.0 in | Wt 255.4 lb

## 2018-05-14 DIAGNOSIS — F331 Major depressive disorder, recurrent, moderate: Secondary | ICD-10-CM

## 2018-05-14 DIAGNOSIS — F411 Generalized anxiety disorder: Secondary | ICD-10-CM

## 2018-05-14 MED ORDER — ESCITALOPRAM OXALATE 10 MG PO TABS: 10.0000 mg | ORAL_TABLET | Freq: Every day | ORAL | 3 refills | Status: DC

## 2018-05-14 NOTE — Progress Notes (Signed)
Subjective:    Patient ID: Hayley Crawford, female    DOB: [birth year], 53 y.o.   MRN: [id number]  Chief Complaint  Patient presents with  . mood swings   Pt presents to the office today with mood swings. States she quit smoking about 6 weeks ago and stopped nicotine patch over a month ago.  Anxiety  Presents for follow-up visit. Symptoms include decreased concentration, depressed mood, excessive worry, irritability, nervous/anxious behavior, panic and restlessness. Symptoms occur occasionally. The severity of symptoms is moderate.    Depression         This is a chronic problem.  The current episode started more than 1 year ago.   The onset quality is gradual.   The problem occurs intermittently.  The problem has been waxing and waning since onset.  Associated symptoms include decreased concentration, irritable, restlessness and sad.  Associated symptoms include no helplessness and no hopelessness.  Treatments tried: buspar.  Past medical history includes anxiety.       Review of Systems  Constitutional: Positive for irritability.  Psychiatric/Behavioral: Positive for decreased concentration and depression. The patient is nervous/anxious.   All other systems reviewed and are negative.      Objective:   Physical Exam Vitals signs reviewed.  Constitutional:      General: She is irritable. She is not in acute distress.    Appearance: She is well-developed. She is obese.  HENT:     Head: Normocephalic and atraumatic.  Eyes:     Pupils: Pupils are equal, round, and reactive to light.  Neck:     Musculoskeletal: Normal range of motion and neck supple.     Thyroid: No thyromegaly.  Cardiovascular:     Rate and Rhythm: Normal rate and regular rhythm.     Heart sounds: Normal heart sounds. No murmur.  Pulmonary:     Effort: Pulmonary effort is normal. No respiratory distress.     Breath sounds: Normal breath sounds. No wheezing.  Abdominal:     General: Bowel sounds are normal.  There is no distension.     Palpations: Abdomen is soft.     Tenderness: There is no abdominal tenderness.  Musculoskeletal: Normal range of motion.        General: No tenderness.  Skin:    General: Skin is warm and dry.  Neurological:     Mental Status: She is alert and oriented to person, place, and time.     Cranial Nerves: No cranial nerve deficit.     Deep Tendon Reflexes: Reflexes are normal and symmetric.  Psychiatric:        Behavior: Behavior normal.        Thought Content: Thought content normal.        Judgment: Judgment normal.     BP 114/73   Pulse 84   Temp (!) 97.2 F (36.2 C) (Oral)   Ht 5\' 2"  (1.575 m)   Wt 255 lb 6.4 oz (115.8 kg)   LMP 05/31/2014   BMI 46.71 kg/m      Assessment & Plan:  Hayley Crawford comes in today with chief complaint of mood swings   Diagnosis and orders addressed:  1. GAD (generalized anxiety disorder) - escitalopram (LEXAPRO) 10 MG tablet; Take 1 tablet (10 mg total) by mouth daily.  Dispense: 90 tablet; Refill: 3  2. Mood disorder (Hayley Crawford) We will start Lexapro 10 mg today Stress management discussed Possible adverse effects discussed RTO in 6 weeks  - escitalopram (LEXAPRO)  10 MG tablet; Take 1 tablet (10 mg total) by mouth daily.  Dispense: 90 tablet; Refill: [place], FNP

## 2018-05-14 NOTE — Patient Instructions (Signed)

## 2018-06-26 ENCOUNTER — Ambulatory Visit: Payer: Self-pay | Admitting: Family

## 2018-07-21 ENCOUNTER — Other Ambulatory Visit: Payer: Self-pay | Admitting: Family

## 2018-07-21 DIAGNOSIS — M5441 Lumbago with sciatica, right side: Secondary | ICD-10-CM

## 2018-07-21 DIAGNOSIS — K219 Gastro-esophageal reflux disease without esophagitis: Secondary | ICD-10-CM

## 2018-07-22 ENCOUNTER — Other Ambulatory Visit: Payer: Self-pay | Admitting: Family

## 2018-07-22 DIAGNOSIS — K219 Gastro-esophageal reflux disease without esophagitis: Secondary | ICD-10-CM

## 2018-07-22 DIAGNOSIS — M5441 Lumbago with sciatica, right side: Secondary | ICD-10-CM

## 2018-07-23 MED ORDER — IBUPROFEN 600 MG PO TABS: 600.0000 mg | ORAL_TABLET | Freq: Three times a day (TID) | ORAL | 0 refills | Status: DC | PRN

## 2018-07-23 MED ORDER — OMEPRAZOLE 20 MG PO CPDR: 20.0000 mg | DELAYED_RELEASE_CAPSULE | Freq: Every day | ORAL | 0 refills | Status: DC

## 2018-07-23 NOTE — Telephone Encounter (Signed)
Meds refilled, pt aware.

## 2018-07-31 ENCOUNTER — Telehealth: Payer: Self-pay | Admitting: Family

## 2018-07-31 DIAGNOSIS — J441 Chronic obstructive pulmonary disease with (acute) exacerbation: Secondary | ICD-10-CM

## 2018-07-31 DIAGNOSIS — J449 Chronic obstructive pulmonary disease, unspecified: Secondary | ICD-10-CM

## 2018-07-31 MED ORDER — ALBUTEROL SULFATE HFA 108 (90 BASE) MCG/ACT IN AERS: 2.0000 | INHALATION_SPRAY | Freq: Four times a day (QID) | RESPIRATORY_TRACT | 3 refills | Status: DC | PRN

## 2018-07-31 NOTE — Telephone Encounter (Signed)
Pt states that she is due for CPE advised pts are not coming of for CPE right now and she said she would like to have some albuterol for her machine sent to pharmacy incase she needs it. 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

## 2018-07-31 NOTE — Telephone Encounter (Signed)
Per [name], FNP, refill albuterol approved and pt advised to social distance and wash hands. Pt aware to call our office if she starts experiencing any signs/symptoms of any additional problems

## 2018-09-22 ENCOUNTER — Other Ambulatory Visit (HOSPITAL_COMMUNITY): Payer: Self-pay | Admitting: Family

## 2018-09-22 DIAGNOSIS — Z1231 Encounter for screening mammogram for malignant neoplasm of breast: Secondary | ICD-10-CM

## 2018-10-02 ENCOUNTER — Other Ambulatory Visit: Payer: Self-pay

## 2018-10-02 ENCOUNTER — Ambulatory Visit (HOSPITAL_COMMUNITY)
Admission: RE | Admit: 2018-10-02 | Discharge: 2018-10-02 | Disposition: A | Discharge status: Home / Self Care | Payer: BLUE CROSS/BLUE SHIELD | Source: Ambulatory Visit | Attending: Family | Admitting: Family

## 2018-10-02 DIAGNOSIS — Z1231 Encounter for screening mammogram for malignant neoplasm of breast: Secondary | ICD-10-CM | POA: Insufficient documentation

## 2018-10-27 ENCOUNTER — Other Ambulatory Visit: Payer: Self-pay | Admitting: Family

## 2018-10-27 DIAGNOSIS — M5441 Lumbago with sciatica, right side: Secondary | ICD-10-CM

## 2018-11-05 DIAGNOSIS — Z20828 Contact with and (suspected) exposure to other viral communicable diseases: Secondary | ICD-10-CM | POA: Diagnosis not present

## 2018-12-31 ENCOUNTER — Other Ambulatory Visit: Payer: Self-pay

## 2019-01-01 ENCOUNTER — Ambulatory Visit (INDEPENDENT_AMBULATORY_CARE_PROVIDER_SITE_OTHER): Payer: BC Managed Care – PPO | Admitting: Family

## 2019-01-01 ENCOUNTER — Encounter: Payer: Self-pay | Admitting: Family

## 2019-01-01 VITALS — BP 124/83 | HR 82 | Temp 98.6°F | Ht 62.0 in | Wt 252.6 lb

## 2019-01-01 DIAGNOSIS — Z6841 Body Mass Index (BMI) 40.0 and over, adult: Secondary | ICD-10-CM

## 2019-01-01 DIAGNOSIS — F411 Generalized anxiety disorder: Secondary | ICD-10-CM | POA: Diagnosis not present

## 2019-01-01 DIAGNOSIS — E785 Hyperlipidemia, unspecified: Secondary | ICD-10-CM | POA: Diagnosis not present

## 2019-01-01 DIAGNOSIS — I10 Essential (primary) hypertension: Secondary | ICD-10-CM | POA: Diagnosis not present

## 2019-01-01 DIAGNOSIS — Z0001 Encounter for general adult medical examination with abnormal findings: Secondary | ICD-10-CM

## 2019-01-01 DIAGNOSIS — E538 Deficiency of other specified B group vitamins: Secondary | ICD-10-CM | POA: Diagnosis not present

## 2019-01-01 DIAGNOSIS — K219 Gastro-esophageal reflux disease without esophagitis: Secondary | ICD-10-CM

## 2019-01-01 DIAGNOSIS — F39 Unspecified mood [affective] disorder: Secondary | ICD-10-CM

## 2019-01-01 DIAGNOSIS — E8881 Metabolic syndrome: Secondary | ICD-10-CM

## 2019-01-01 DIAGNOSIS — Z0000 Encounter for general adult medical examination without abnormal findings: Secondary | ICD-10-CM

## 2019-01-01 DIAGNOSIS — D509 Iron deficiency anemia, unspecified: Secondary | ICD-10-CM | POA: Diagnosis not present

## 2019-01-01 DIAGNOSIS — E559 Vitamin D deficiency, unspecified: Secondary | ICD-10-CM

## 2019-01-01 DIAGNOSIS — F331 Major depressive disorder, recurrent, moderate: Secondary | ICD-10-CM

## 2019-01-01 DIAGNOSIS — J449 Chronic obstructive pulmonary disease, unspecified: Secondary | ICD-10-CM

## 2019-01-01 MED ORDER — BUSPIRONE HCL 10 MG PO TABS: 10.0000 mg | ORAL_TABLET | Freq: Three times a day (TID) | ORAL | 1 refills | Status: DC | PRN

## 2019-01-01 MED ORDER — LOSARTAN POTASSIUM 100 MG PO TABS: 100.0000 mg | ORAL_TABLET | Freq: Every day | ORAL | 3 refills | Status: DC

## 2019-01-01 MED ORDER — CYCLOBENZAPRINE HCL 10 MG PO TABS: 10.0000 mg | ORAL_TABLET | Freq: Three times a day (TID) | ORAL | 1 refills | Status: DC | PRN

## 2019-01-01 MED ORDER — ATORVASTATIN CALCIUM 20 MG PO TABS: 20.0000 mg | ORAL_TABLET | Freq: Every day | ORAL | 11 refills | Status: DC

## 2019-01-01 MED ORDER — HYDROCHLOROTHIAZIDE 25 MG PO TABS: 25.0000 mg | ORAL_TABLET | Freq: Every day | ORAL | 3 refills | Status: DC

## 2019-01-01 MED ORDER — OMEPRAZOLE 20 MG PO CPDR: 20.0000 mg | DELAYED_RELEASE_CAPSULE | Freq: Every day | ORAL | 0 refills | Status: DC

## 2019-01-01 NOTE — Patient Instructions (Signed)
Health Maintenance, Female Adopting a healthy lifestyle and getting preventive care are important in promoting health and wellness. Ask your health care provider about:  The right schedule for you to have regular tests and exams.  Things you can do on your own to prevent diseases and keep yourself healthy. What should I know about diet, weight, and exercise? Eat a healthy diet   Eat a diet that includes plenty of vegetables, fruits, low-fat dairy products, and lean protein.  Do not eat a lot of foods that are high in solid fats, added sugars, or sodium. Maintain a healthy weight Body mass index (BMI) is used to identify weight problems. It estimates body fat based on height and weight. Your health care provider can help determine your BMI and help you achieve or maintain a healthy weight. Get regular exercise Get regular exercise. This is one of the most important things you can do for your health. Most adults should:  Exercise for at least 150 minutes each week. The exercise should increase your heart rate and make you sweat (moderate-intensity exercise).  Do strengthening exercises at least twice a week. This is in addition to the moderate-intensity exercise.  Spend less time sitting. Even light physical activity can be beneficial. Watch cholesterol and blood lipids Have your blood tested for lipids and cholesterol at 55 years of age, then have this test every 5 years. Have your cholesterol levels checked more often if:  Your lipid or cholesterol levels are high.  You are older than 55 years of age.  You are at high risk for heart disease. What should I know about cancer screening? Depending on your health history and family history, you may need to have cancer screening at various ages. This may include screening for:  Breast cancer.  Cervical cancer.  Colorectal cancer.  Skin cancer.  Lung cancer. What should I know about heart disease, diabetes, and high blood  pressure? Blood pressure and heart disease  High blood pressure causes heart disease and increases the risk of stroke. This is more likely to develop in people who have high blood pressure readings, are of African descent, or are overweight.  Have your blood pressure checked: ? Every 3-5 years if you are 18-39 years of age. ? Every year if you are 40 years old or older. Diabetes Have regular diabetes screenings. This checks your fasting blood sugar level. Have the screening done:  Once every three years after age 40 if you are at a normal weight and have a low risk for diabetes.  More often and at a younger age if you are overweight or have a high risk for diabetes. What should I know about preventing infection? Hepatitis B If you have a higher risk for hepatitis B, you should be screened for this virus. Talk with your health care provider to find out if you are at risk for hepatitis B infection. Hepatitis C Testing is recommended for:  Everyone born from 1945 through 1965.  Anyone with known risk factors for hepatitis C. Sexually transmitted infections (STIs)  Get screened for STIs, including gonorrhea and chlamydia, if: ? You are sexually active and are younger than 55 years of age. ? You are older than 55 years of age and your health care provider tells you that you are at risk for this type of infection. ? Your sexual activity has changed since you were last screened, and you are at increased risk for chlamydia or gonorrhea. Ask your health care provider if   you are at risk.  Ask your health care provider about whether you are at high risk for HIV. Your health care provider may recommend a prescription medicine to help prevent HIV infection. If you choose to take medicine to prevent HIV, you should first get tested for HIV. You should then be tested every 3 months for as long as you are taking the medicine. Pregnancy  If you are about to stop having your period (premenopausal) and  you may become pregnant, seek counseling before you get pregnant.  Take 400 to 800 micrograms (mcg) of folic acid every day if you become pregnant.  Ask for birth control (contraception) if you want to prevent pregnancy. Osteoporosis and menopause Osteoporosis is a disease in which the bones lose minerals and strength with aging. This can result in bone fractures. If you are 65 years old or older, or if you are at risk for osteoporosis and fractures, ask your health care provider if you should:  Be screened for bone loss.  Take a calcium or vitamin D supplement to lower your risk of fractures.  Be given hormone replacement therapy (HRT) to treat symptoms of menopause. Follow these instructions at home: Lifestyle  Do not use any products that contain nicotine or tobacco, such as cigarettes, e-cigarettes, and chewing tobacco. If you need help quitting, ask your health care provider.  Do not use street drugs.  Do not share needles.  Ask your health care provider for help if you need support or information about quitting drugs. Alcohol use  Do not drink alcohol if: ? Your health care provider tells you not to drink. ? You are pregnant, may be pregnant, or are planning to become pregnant.  If you drink alcohol: ? Limit how much you use to 0-1 drink a day. ? Limit intake if you are breastfeeding.  Be aware of how much alcohol is in your drink. In the [place] one drink equals one 12 oz bottle of beer (355 mL), one 5 oz glass of wine (148 mL), or one 1 oz glass of hard liquor (44 mL). General instructions  Schedule regular health, dental, and eye exams.  Stay current with your vaccines.  Tell your health care provider if: ? You often feel depressed. ? You have ever been abused or do not feel safe at home. Summary  Adopting a healthy lifestyle and getting preventive care are important in promoting health and wellness.  Follow your health care provider's instructions about healthy  diet, exercising, and getting tested or screened for diseases.  Follow your health care provider's instructions on monitoring your cholesterol and blood pressure. This information is not intended to replace advice given to you by your health care provider. Make sure you discuss any questions you have with your health care provider. Document Released: 11/05/2010 Document Revised: 04/15/2018 Document Reviewed: 04/15/2018 Elsevier Patient Education  2020 Elsevier Inc.  

## 2019-01-01 NOTE — Progress Notes (Signed)
Subjective:    Patient ID: Hayley Crawford, female    DOB: 03-12-1966, 53 y.o.   MRN: [id number]  Chief Complaint  Patient presents with  . Annual Exam   PT presents to the office today for CPE without pap. Hypertension This is a chronic problem. The current episode started more than 1 year ago. The problem has been resolved since onset. The problem is controlled. Associated symptoms include anxiety and peripheral edema. Pertinent negatives include no malaise/fatigue or shortness of breath. Risk factors for coronary artery disease include dyslipidemia, obesity and sedentary lifestyle. The current treatment provides moderate improvement. There is no history of kidney disease, CAD/MI or heart failure.  Gastroesophageal Reflux She complains of belching and heartburn. She reports no coughing. This is a chronic problem. The current episode started more than 1 year ago. The problem occurs occasionally. The problem has been waxing and waning. Risk factors include obesity. She has tried a PPI for the symptoms. The treatment provided moderate relief.  Anemia Presents for follow-up visit. There has been no bruising/bleeding easily, confusion or malaise/fatigue. There is no history of heart failure.  Hyperlipidemia This is a chronic problem. The current episode started more than 1 year ago. The problem is controlled. Recent lipid tests were reviewed and are normal. Exacerbating diseases include obesity. Pertinent negatives include no shortness of breath. Current antihyperlipidemic treatment includes statins. The current treatment provides moderate improvement of lipids. Risk factors for coronary artery disease include dyslipidemia, hypertension, a sedentary lifestyle and post-menopausal.  Anxiety Presents for follow-up visit. Symptoms include excessive worry, irritability and restlessness. Patient reports no confusion or shortness of breath. Symptoms occur occasionally. The severity of symptoms is moderate.   Her past medical history is significant for anemia.  COPD  Pt quit smoking 01/2018. Uses albuterol as needed.  Metabolic Syndrome  PT takes her Lipitor. Does not do any scheduled exercising.    Review of Systems  Constitutional: Positive for irritability. Negative for malaise/fatigue.  Respiratory: Negative for cough and shortness of breath.   Gastrointestinal: Positive for heartburn.  Hematological: Does not bruise/bleed easily.  Psychiatric/Behavioral: Negative for confusion.  All other systems reviewed and are negative.      Objective:   Physical Exam Vitals signs reviewed.  Constitutional:      General: She is not in acute distress.    Appearance: She is well-developed.  HENT:     Head: Normocephalic and atraumatic.     Right Ear: Tympanic membrane normal.     Left Ear: Tympanic membrane normal.  Eyes:     Pupils: Pupils are equal, round, and reactive to light.  Neck:     Musculoskeletal: Normal range of motion and neck supple.     Thyroid: No thyromegaly.  Cardiovascular:     Rate and Rhythm: Normal rate and regular rhythm.     Heart sounds: Normal heart sounds. No murmur.  Pulmonary:     Effort: Pulmonary effort is normal. No respiratory distress.     Breath sounds: Normal breath sounds. No wheezing.  Abdominal:     General: Bowel sounds are normal. There is no distension.     Palpations: Abdomen is soft.     Tenderness: There is no abdominal tenderness.  Musculoskeletal: Normal range of motion.        General: No tenderness.  Skin:    General: Skin is warm and dry.  Neurological:     Mental Status: She is alert and oriented to person, place, and time.  Cranial Nerves: No cranial nerve deficit.     Deep Tendon Reflexes: Reflexes are normal and symmetric.  Psychiatric:        Behavior: Behavior normal.        Thought Content: Thought content normal.        Judgment: Judgment normal.      BP 124/83   Pulse 82   Temp 98.6 F (37 C) (Temporal)   Ht  _0  (1.575 m)   Wt 252 lb 9.6 oz (114.6 kg)   LMP 05/31/2014   BMI 46.20 kg/m      Assessment & Plan:  Hayley Crawford comes in today with chief complaint of Annual Exam   Diagnosis and orders addressed:  1. GAD (generalized anxiety disorder) - busPIRone (BUSPAR) 10 MG tablet; Take 1 tablet (10 mg total) by mouth 3 (three) times daily as needed.  Dispense: 90 tablet; Refill: 1 - CMP14+EGFR  2. Moderate episode of recurrent major depressive disorder (HCC) - CMP14+EGFR  3. Gastroesophageal reflux disease, esophagitis presence not specified - omeprazole (PRILOSEC) 20 MG capsule; Take 1 capsule (20 mg total) by mouth daily.  Dispense: 90 capsule; Refill: 0 - CMP14+EGFR  4. Annual physical exam - Anemia Profile B - CMP14+EGFR - Lipid panel - TSH - VITAMIN D 25 Hydroxy (Vit-D Deficiency, Fractures)  5. Essential hypertension - hydrochlorothiazide (HYDRODIURIL) 25 MG tablet; Take 1 tablet (25 mg total) by mouth daily.  Dispense: 90 tablet; Refill: 3 - losartan (COZAAR) 100 MG tablet; Take 1 tablet (100 mg total) by mouth daily.  Dispense: 90 tablet; Refill: 3 - CMP14+EGFR  6. Chronic obstructive pulmonary disease, unspecified COPD type ([place]) - CMP14+EGFR  7. Hyperlipidemia, unspecified hyperlipidemia type - CMP14+EGFR - Lipid panel  8. Iron deficiency anemia, unspecified iron deficiency anemia type - Anemia Profile B - ONG29+BMWU  9. Metabolic syndrome - XLK44+WNUU  10. Mood disorder (HCC) - CMP14+EGFR  11. Morbid obesity with BMI of 40.0-44.9, adult ([place]) - CMP14+EGFR   13. Vitamin D deficiency - CMP14+EGFR - VITAMIN D 25 Hydroxy (Vit-D Deficiency, Fractures)  14. Vitamin B 12 deficiency - Anemia Profile B - CMP14+EGFR   Labs pending Health Maintenance reviewed Diet and exercise encouraged  Follow up plan: 6 months    [name], FNP

## 2019-01-02 LAB — CMP14+EGFR
ALT: 51 IU/L — ABNORMAL HIGH (ref 0–32)
AST: 38 IU/L (ref 0–40)
Albumin/Globulin Ratio: 1.6 (ref 1.2–2.2)
Albumin: 4.3 g/dL (ref 3.8–4.9)
Alkaline Phosphatase: 111 IU/L (ref 39–117)
BUN/Creatinine Ratio: 16 (ref 9–23)
BUN: 10 mg/dL (ref 6–24)
Bilirubin Total: 0.2 mg/dL (ref 0.0–1.2)
CO2: 26 mmol/L (ref 20–29)
Calcium: 9.5 mg/dL (ref 8.7–10.2)
Chloride: 99 mmol/L (ref 96–106)
Creatinine, Ser: 0.63 mg/dL (ref 0.57–1.00)
GFR calc Af Amer: 117 mL/min/{1.73_m2} (ref >59)
GFR calc non Af Amer: 101 mL/min/{1.73_m2} (ref >59)
Globulin, Total: 2.7 g/dL (ref 1.5–4.5)
Glucose: 87 mg/dL (ref 65–99)
Potassium: 4 mmol/L (ref 3.5–5.2)
Sodium: 139 mmol/L (ref 134–144)
Total Protein: 7 g/dL (ref 6.0–8.5)

## 2019-01-02 LAB — ANEMIA PROFILE B
Basophils Absolute: 0 10*3/uL (ref 0.0–0.2)
Basos: 1 %
EOS (ABSOLUTE): 0.1 10*3/uL (ref 0.0–0.4)
Eos: 1 %
Ferritin: 73 ng/mL (ref 15–150)
Folate: 6.5 ng/mL (ref >3.0)
Hematocrit: 38.9 % (ref 34.0–46.6)
Hemoglobin: 13.5 g/dL (ref 11.1–15.9)
Immature Grans (Abs): 0 10*3/uL (ref 0.0–0.1)
Immature Granulocytes: 0 %
Iron Saturation: 18 % (ref 15–55)
Iron: 65 ug/dL (ref 27–159)
Lymphocytes Absolute: 1.8 10*3/uL (ref 0.7–3.1)
Lymphs: 21 %
MCH: 31.8 pg (ref 26.6–33.0)
MCHC: 34.7 g/dL (ref 31.5–35.7)
MCV: 92 fL (ref 79–97)
Monocytes Absolute: 0.5 10*3/uL (ref 0.1–0.9)
Monocytes: 6 %
Neutrophils Absolute: 6.1 10*3/uL (ref 1.4–7.0)
Neutrophils: 71 %
Platelets: 320 10*3/uL (ref 150–450)
RBC: 4.25 x10E6/uL (ref 3.77–5.28)
RDW: 13.2 % (ref 11.7–15.4)
Retic Ct Pct: 2 % (ref 0.6–2.6)
Total Iron Binding Capacity: 364 ug/dL (ref 250–450)
UIBC: 299 ug/dL (ref 131–425)
Vitamin B-12: 322 pg/mL (ref 232–1245)
WBC: 8.6 10*3/uL (ref 3.4–10.8)

## 2019-01-02 LAB — VITAMIN D 25 HYDROXY (VIT D DEFICIENCY, FRACTURES): Vit D, 25-Hydroxy: 20.9 ng/mL — ABNORMAL LOW (ref 30.0–100.0)

## 2019-01-02 LAB — LIPID PANEL
Chol/HDL Ratio: 4.6 ratio — ABNORMAL HIGH (ref 0.0–4.4)
Cholesterol, Total: 230 mg/dL — ABNORMAL HIGH (ref 100–199)
HDL: 50 mg/dL (ref >39)
Triglycerides: 409 mg/dL — ABNORMAL HIGH (ref 0–149)

## 2019-01-02 LAB — TSH: TSH: 1.88 u[IU]/mL (ref 0.450–4.500)

## 2019-01-05 ENCOUNTER — Other Ambulatory Visit: Payer: Self-pay | Admitting: Family

## 2019-01-05 MED ORDER — ROSUVASTATIN CALCIUM 20 MG PO TABS: 20.0000 mg | ORAL_TABLET | Freq: Every day | ORAL | 11 refills | Status: DC

## 2019-01-05 MED ORDER — VITAMIN D (ERGOCALCIFEROL) 1.25 MG (50000 UNIT) PO CAPS: 50000.0000 [IU] | ORAL_CAPSULE | ORAL | 3 refills | Status: DC

## 2019-03-28 ENCOUNTER — Other Ambulatory Visit: Payer: Self-pay | Admitting: Family

## 2019-03-28 DIAGNOSIS — K219 Gastro-esophageal reflux disease without esophagitis: Secondary | ICD-10-CM

## 2019-03-28 DIAGNOSIS — M5441 Lumbago with sciatica, right side: Secondary | ICD-10-CM

## 2019-04-05 ENCOUNTER — Other Ambulatory Visit: Payer: Self-pay

## 2019-04-05 DIAGNOSIS — M5441 Lumbago with sciatica, right side: Secondary | ICD-10-CM

## 2019-04-05 DIAGNOSIS — K219 Gastro-esophageal reflux disease without esophagitis: Secondary | ICD-10-CM

## 2019-04-06 MED ORDER — OMEPRAZOLE 20 MG PO CPDR: 20.0000 mg | DELAYED_RELEASE_CAPSULE | Freq: Every day | ORAL | 0 refills | Status: DC

## 2019-04-06 MED ORDER — IBUPROFEN 600 MG PO TABS: 600.0000 mg | ORAL_TABLET | Freq: Three times a day (TID) | ORAL | 0 refills | Status: DC | PRN

## 2019-04-07 ENCOUNTER — Ambulatory Visit (INDEPENDENT_AMBULATORY_CARE_PROVIDER_SITE_OTHER): Payer: BC Managed Care – PPO | Admitting: Physician Assistant

## 2019-04-07 ENCOUNTER — Encounter: Payer: Self-pay | Admitting: Physician Assistant

## 2019-04-07 DIAGNOSIS — J209 Acute bronchitis, unspecified: Secondary | ICD-10-CM

## 2019-04-07 DIAGNOSIS — U071 COVID-19: Secondary | ICD-10-CM | POA: Diagnosis not present

## 2019-04-07 MED ORDER — AZITHROMYCIN 250 MG PO TABS: ORAL_TABLET | ORAL | 0 refills | Status: DC

## 2019-04-07 MED ORDER — PREDNISONE 10 MG (21) PO TBPK: ORAL_TABLET | ORAL | 0 refills | Status: DC

## 2019-04-07 NOTE — Progress Notes (Signed)
Telephone visit  Subjective: [id number], COVID worsening PCP: Sharion Balloon, FNP [id number] Hayley Crawford is a 53 y.o. female calls for telephone consult today. Patient provides verbal consent for consult held via phone.  Patient is identified with 2 separate identifiers.  At this time the entire area is on COVID-19 social distancing and stay home orders are in place.  Patient is of higher risk and therefore we are performing this by a virtual method.  Location of patient: home Location of provider: HOME Others present for call: no  Patient with known COPD started having symptoms about 2 weeks ago.  She did work for a couple of days that week.  But got worse as the week and 101.  She does have known exposure at work.  Someone outside had a test.  She has been in bed told she has a positive test.  Over the weekend she had started with a fever of 100.4 and chills, headache, body aches.  She does have COPD and does get bronchitis.  She states she is at the point where she is having some tightness and wheezing.  She does not have a productive cough.  I reviewed her medications and she does have albuterol.  Of asked her to use it 4 times a day to try to keep her airway open.  And we will add an antibiotic and prednisone pack because it has been over 8 days of being sick.  We will have her out of work 1130 through 1211.  If that needs to be changed she can let us know.   ROS: Per HPI  Allergies  Allergen Reactions  . Sulfa Antibiotics Swelling    "think it caused my throat to swell"   Past Medical History:  Diagnosis Date  . B12 deficiency   . COPD (chronic obstructive pulmonary disease) ([place])   . Hyperlipidemia   . Hypertension   . Uterine fibroid     Current Outpatient Medications:  .  albuterol (PROVENTIL HFA;VENTOLIN HFA) 108 (90 Base) MCG/ACT inhaler, Inhale 2 puffs into the lungs every 6 (six) hours as needed for wheezing or shortness of breath., Disp: 1 Inhaler, Rfl: 3 .   azithromycin (ZITHROMAX Z-PAK) 250 MG tablet, Take as directed, Disp: 6 each, Rfl: 0 .  busPIRone (BUSPAR) 10 MG tablet, Take 1 tablet (10 mg total) by mouth 3 (three) times daily as needed., Disp: 90 tablet, Rfl: 1 .  cyclobenzaprine (FLEXERIL) 10 MG tablet, Take 1 tablet (10 mg total) by mouth 3 (three) times daily as needed for muscle spasms., Disp: 60 tablet, Rfl: 1 .  hydrochlorothiazide (HYDRODIURIL) 25 MG tablet, Take 1 tablet (25 mg total) by mouth daily., Disp: 90 tablet, Rfl: 3 .  ibuprofen (ADVIL) 600 MG tablet, Take 1 tablet (600 mg total) by mouth every 8 (eight) hours as needed., Disp: 90 tablet, Rfl: 0 .  loratadine (CLARITIN) 10 MG tablet, Take 10 mg by mouth daily., Disp: , Rfl:  .  losartan (COZAAR) 100 MG tablet, Take 1 tablet (100 mg total) by mouth daily., Disp: 90 tablet, Rfl: 3 .  nitroGLYCERIN (NITROSTAT) 0.4 MG SL tablet, Place 1 tablet (0.4 mg total) under the tongue every 5 (five) minutes as needed for chest pain., Disp: 25 tablet, Rfl: 3 .  omeprazole (PRILOSEC) 20 MG capsule, Take 1 capsule (20 mg total) by mouth daily., Disp: 90 capsule, Rfl: 0 .  predniSONE (STERAPRED UNI-PAK 21 TAB) 10 MG (21) TBPK tablet, As directed x 6 days, Disp:  21 tablet, Rfl: 0 .  promethazine (PHENERGAN) 25 MG tablet, TAKE 1/2 TO 1 TABLETS BY MOUTH EVERY 8 HOURS AS NEEDED FOR NAUSE OR VOMITING, Disp: 10 tablet, Rfl: 0 .  rosuvastatin (CRESTOR) 20 MG tablet, Take 1 tablet (20 mg total) by mouth at bedtime., Disp: 30 tablet, Rfl: 11 .  Vitamin D, Ergocalciferol, (DRISDOL) 1.25 MG (50000 UT) CAPS capsule, Take 1 capsule (50,000 Units total) by mouth every 7 (seven) days., Disp: 12 capsule, Rfl: 3  Current Facility-Administered Medications:  .  cyanocobalamin ((VITAMIN B-12)) injection 1,000 mcg, 1,000 mcg, Intramuscular, Q30 days, [name] A, FNP, 1,000 mcg at 05/12/18 1606  Assessment/ Plan: 53 y.o. female   1. COVID-19 virus infection - azithromycin (ZITHROMAX Z-PAK) 250 MG tablet;  Take as directed  Dispense: 6 each; Refill: 0 - predniSONE (STERAPRED UNI-PAK 21 TAB) 10 MG (21) TBPK tablet; As directed x 6 days  Dispense: 21 tablet; Refill: 0  2. Bronchitis with bronchospasm - azithromycin (ZITHROMAX Z-PAK) 250 MG tablet; Take as directed  Dispense: 6 each; Refill: 0 - predniSONE (STERAPRED UNI-PAK 21 TAB) 10 MG (21) TBPK tablet; As directed x 6 days  Dispense: 21 tablet; Refill: 0   No follow-ups on file.  Continue all other maintenance medications as listed above.  Start time: 10:39 AM End time: 10:49 AM  Meds ordered this encounter  Medications  . DISCONTD: azithromycin (ZITHROMAX Z-PAK) 250 MG tablet    Sig: Take as directed    Dispense:  6 each    Refill:  0    Order Specific Question:   Supervising Provider    Answer:   Janora Norlander [id number]  . DISCONTD: predniSONE (STERAPRED UNI-PAK 21 TAB) 10 MG (21) TBPK tablet    Sig: As directed x 6 days    Dispense:  21 tablet    Refill:  0    Order Specific Question:   Supervising Provider    Answer:   Janora Norlander [id number]  . azithromycin (ZITHROMAX Z-PAK) 250 MG tablet    Sig: Take as directed    Dispense:  6 each    Refill:  0    Patient wants delivery    Order Specific Question:   Supervising Provider    Answer:   Janora Norlander [id number]  . predniSONE (STERAPRED UNI-PAK 21 TAB) 10 MG (21) TBPK tablet    Sig: As directed x 6 days    Dispense:  21 tablet    Refill:  0    Ask for delivery    Order Specific Question:   Supervising Provider    Answer:   Janora Norlander [id number]    Particia Nearing PA-C [place] [phone]

## 2019-08-05 DIAGNOSIS — Z23 Encounter for immunization: Secondary | ICD-10-CM | POA: Diagnosis not present

## 2019-08-27 DIAGNOSIS — Z23 Encounter for immunization: Secondary | ICD-10-CM | POA: Diagnosis not present

## 2019-09-28 ENCOUNTER — Encounter: Payer: Self-pay | Admitting: Family Medicine

## 2019-09-28 ENCOUNTER — Other Ambulatory Visit: Payer: Self-pay

## 2019-09-28 ENCOUNTER — Ambulatory Visit: Payer: BC Managed Care – PPO | Admitting: Family Medicine

## 2019-09-28 ENCOUNTER — Ambulatory Visit (INDEPENDENT_AMBULATORY_CARE_PROVIDER_SITE_OTHER): Payer: BC Managed Care – PPO

## 2019-09-28 VITALS — BP 127/80 | HR 83 | Temp 98.0°F | Resp 20 | Ht 62.0 in | Wt 241.0 lb

## 2019-09-28 DIAGNOSIS — M25571 Pain in right ankle and joints of right foot: Secondary | ICD-10-CM

## 2019-09-28 DIAGNOSIS — M19071 Primary osteoarthritis, right ankle and foot: Secondary | ICD-10-CM | POA: Diagnosis not present

## 2019-09-28 DIAGNOSIS — M79671 Pain in right foot: Secondary | ICD-10-CM | POA: Diagnosis not present

## 2019-09-28 MED ORDER — PREDNISONE 10 MG (21) PO TBPK: ORAL_TABLET | ORAL | 0 refills | Status: DC

## 2019-09-28 NOTE — Progress Notes (Signed)
Assessment & Plan:  1. Acute right ankle pain - Education provided on ankle pain.  - DG Ankle Complete Right; Future - predniSONE (STERAPRED UNI-PAK 21 TAB) 10 MG (21) TBPK tablet; As directed x 6 days  Dispense: 21 tablet; Refill: 0  2. Pain of right heel - DG Ankle Complete Right; Future   Follow up plan: Return if symptoms worsen or fail to improve.  Hendricks Limes, MSN, APRN, FNP-C [facility]  Subjective:   Patient ID: Hayley Crawford, female    DOB: 12-May-1965, 54 y.o.   MRN: [id number]  HPI: Hayley Crawford is a 54 y.o. female presenting on 09/28/2019 for Ankle Pain (right / heel hurts also )  Patient has been having pain in the back of her heel/ankle x2 days. The day before her pain started she mowed the yard with a push mower, weed-eatted, dug holes for flowers, and swept the driveway. She has been taking Ibuprofen 600 mg for pain which is not really helping.    ROS: Negative unless specifically indicated above in HPI.   Relevant past medical history reviewed and updated as indicated.   Allergies and medications reviewed and updated.   Current Outpatient Medications:  .  albuterol (PROVENTIL HFA;VENTOLIN HFA) 108 (90 Base) MCG/ACT inhaler, Inhale 2 puffs into the lungs every 6 (six) hours as needed for wheezing or shortness of breath., Disp: 1 Inhaler, Rfl: 3 .  busPIRone (BUSPAR) 10 MG tablet, Take 1 tablet (10 mg total) by mouth 3 (three) times daily as needed., Disp: 90 tablet, Rfl: 1 .  cyclobenzaprine (FLEXERIL) 10 MG tablet, Take 1 tablet (10 mg total) by mouth 3 (three) times daily as needed for muscle spasms., Disp: 60 tablet, Rfl: 1 .  hydrochlorothiazide (HYDRODIURIL) 25 MG tablet, Take 1 tablet (25 mg total) by mouth daily., Disp: 90 tablet, Rfl: 3 .  ibuprofen (ADVIL) 600 MG tablet, Take 1 tablet (600 mg total) by mouth every 8 (eight) hours as needed., Disp: 90 tablet, Rfl: 0 .  losartan (COZAAR) 100 MG tablet, Take 1 tablet (100 mg total)  by mouth daily., Disp: 90 tablet, Rfl: 3 .  omeprazole (PRILOSEC) 20 MG capsule, Take 1 capsule (20 mg total) by mouth daily., Disp: 90 capsule, Rfl: 0 .  loratadine (CLARITIN) 10 MG tablet, Take 10 mg by mouth daily., Disp: , Rfl:  .  nitroGLYCERIN (NITROSTAT) 0.4 MG SL tablet, Place 1 tablet (0.4 mg total) under the tongue every 5 (five) minutes as needed for chest pain. (Patient not taking: Reported on 09/28/2019), Disp: 25 tablet, Rfl: 3 .  predniSONE (STERAPRED UNI-PAK 21 TAB) 10 MG (21) TBPK tablet, As directed x 6 days, Disp: 21 tablet, Rfl: 0 .  promethazine (PHENERGAN) 25 MG tablet, TAKE 1/2 TO 1 TABLETS BY MOUTH EVERY 8 HOURS AS NEEDED FOR NAUSE OR VOMITING (Patient not taking: Reported on 09/28/2019), Disp: 10 tablet, Rfl: 0  Current Facility-Administered Medications:  .  cyanocobalamin ((VITAMIN B-12)) injection 1,000 mcg, 1,000 mcg, Intramuscular, Q30 days, Hawks, Christy A, FNP, 1,000 mcg at 05/12/18 1606  Allergies  Allergen Reactions  . Sulfa Antibiotics Swelling    "think it caused my throat to swell"    Objective:   BP 127/80   Pulse 83   Temp 98 F (36.7 C)   Resp 20   Ht 5\' 2"  (1.575 m)   Wt 241 lb (109.3 kg)   LMP 05/31/2014   SpO2 98%   BMI 44.08 kg/m    Physical  Exam Vitals reviewed.  Constitutional:      General: She is not in acute distress.    Appearance: Normal appearance. She is morbidly obese. She is not ill-appearing, toxic-appearing or diaphoretic.  HENT:     Head: Normocephalic and atraumatic.  Eyes:     General: No scleral icterus.       Right eye: No discharge.        Left eye: No discharge.     Conjunctiva/sclera: Conjunctivae normal.  Cardiovascular:     Rate and Rhythm: Normal rate.  Pulmonary:     Effort: Pulmonary effort is normal. No respiratory distress.  Musculoskeletal:        General: Normal range of motion.     Cervical back: Normal range of motion.     Right ankle: Swelling (back of ankle/heel) present. Tenderness (back of  ankle/heel) present.     Right Achilles Tendon: Normal.  Skin:    General: Skin is warm and dry.     Capillary Refill: Capillary refill takes less than 2 seconds.  Neurological:     General: No focal deficit present.     Mental Status: She is alert and oriented to person, place, and time. Mental status is at baseline.  Psychiatric:        Mood and Affect: Mood normal.        Behavior: Behavior normal.        Thought Content: Thought content normal.        Judgment: Judgment normal.

## 2019-09-28 NOTE — Patient Instructions (Signed)
Ankle Pain The ankle joint holds your body weight and allows you to move around. Ankle pain can occur on either side or the back of one ankle or both ankles. Ankle pain may be sharp and burning or dull and aching. There may be tenderness, stiffness, redness, or warmth around the ankle. Many things can cause ankle pain, including an injury to the area and overuse of the ankle. Follow these instructions at home: Activity  Rest your ankle as told by your health care provider. Avoid any activities that cause ankle pain.  Do not use the injured limb to support your body weight until your health care provider says that you can. Use crutches as told by your health care provider.  Do exercises as told by your health care provider.  Ask your health care provider when it is safe to drive if you have a brace on your ankle. If you have a brace:  Wear the brace as told by your health care provider. Remove it only as told by your health care provider.  Loosen the brace if your toes tingle, become numb, or turn cold and blue.  Keep the brace clean.  If the brace is not waterproof: ? Do not let it get wet. ? Cover it with a watertight covering when you take a bath or shower. If you were given an elastic bandage:   Remove it when you take a bath or a shower.  Try not to move your ankle very much, but wiggle your toes from time to time. This helps to prevent swelling.  Adjust the bandage to make it more comfortable if it feels too tight.  Loosen the bandage if you have numbness or tingling in your foot or if your foot turns cold and blue. Managing pain, stiffness, and swelling   If directed, put ice on the painful area. ? If you have a removable brace or elastic bandage, remove it as told by your health care provider. ? Put ice in a plastic bag. ? Place a towel between your skin and the bag. ? Leave the ice on for 20 minutes, 2-3 times a day.  Move your toes often to avoid stiffness and to  lessen swelling.  Raise (elevate) your ankle above the level of your heart while you are sitting or lying down. General instructions  Record information about your pain. Writing down the following may be helpful for you and your health care provider: ? How often you have ankle pain. ? Where the pain is located. ? What the pain feels like.  If treatment involves wearing a prescribed shoe or insole, make sure you wear it correctly and for as long as told by your health care provider.  Take over-the-counter and prescription medicines only as told by your health care provider.  Keep all follow-up visits as told by your health care provider. This is important. Contact a health care provider if:  Your pain gets worse.  Your pain is not relieved with medicines.  You have a fever or chills.  You are having more trouble with walking.  You have new symptoms. Get help right away if:  Your foot, leg, toes, or ankle: ? Tingles or becomes numb. ? Becomes swollen. ? Turns pale or blue. Summary  Ankle pain can occur on either side or the back of one ankle or both ankles.  Ankle pain may be sharp and burning or dull and aching.  Rest your ankle as told by your health care provider.   If told, apply ice to the area.  Take over-the-counter and prescription medicines only as told by your health care provider. This information is not intended to replace advice given to you by your health care provider. Make sure you discuss any questions you have with your health care provider. Document Revised: 08/11/2018 Document Reviewed: 10/29/2017 Elsevier Patient Education  2020 [employer]  

## 2019-10-01 ENCOUNTER — Telehealth: Payer: Self-pay | Admitting: Family

## 2019-10-01 NOTE — Telephone Encounter (Signed)
Aware of provider's suggestions. 

## 2019-10-01 NOTE — Telephone Encounter (Signed)
I do not recommend a boot. I more recommend stretches. If she wants to try, she can use heel lifts.

## 2019-10-01 NOTE — Telephone Encounter (Signed)
  Prescription Request  10/01/2019  What is the name of the medication or equipment? Wants boot for her foot  Have you contacted your pharmacy to request a refill? (if applicable) no  Which pharmacy would you like this sent to? [facility]   Patient notified that their request is being sent to the clinical staff for review and that they should receive a response within 2 business days.

## 2019-10-01 NOTE — Telephone Encounter (Signed)
Pt was seen 5/25 by Blanch Media for ankle/ foot pain.  Pt is requesting a boot   Would this be ok?   ( she would have to sign the don-[employer] paper for insurance)

## 2019-11-01 ENCOUNTER — Other Ambulatory Visit: Payer: Self-pay | Admitting: Family

## 2019-11-01 DIAGNOSIS — M5441 Lumbago with sciatica, right side: Secondary | ICD-10-CM

## 2019-11-02 ENCOUNTER — Other Ambulatory Visit: Payer: Self-pay | Admitting: Family

## 2019-11-09 ENCOUNTER — Ambulatory Visit (INDEPENDENT_AMBULATORY_CARE_PROVIDER_SITE_OTHER): Payer: BC Managed Care – PPO | Admitting: Nurse Practitioner

## 2019-11-09 DIAGNOSIS — W57XXXA Bitten or stung by nonvenomous insect and other nonvenomous arthropods, initial encounter: Secondary | ICD-10-CM

## 2019-11-09 DIAGNOSIS — S30860A Insect bite (nonvenomous) of lower back and pelvis, initial encounter: Secondary | ICD-10-CM | POA: Diagnosis not present

## 2019-11-09 NOTE — Progress Notes (Signed)
   Virtual Visit via telephone Note Due to COVID-19 pandemic this visit was conducted virtually. This visit type was conducted due to national recommendations for restrictions regarding the COVID-19 Pandemic (e.g. social distancing, sheltering in place) in an effort to limit this patient's exposure and mitigate transmission in our community. All issues noted in this document were discussed and addressed.  A physical exam was not performed with this format.:  I connected with Hayley Crawford on 11/09/19 at 9:45 by telephone and verified that I am speaking with the correct person using two identifiers. Hayley Crawford is currently located at work and no ne  is currently with her during visit. The provider, Mary-Margaret Hassell Done, FNP is located in their office at time of visit.  I discussed the limitations, risks, security and privacy concerns of performing an evaluation and management service by telephone and the availability of in person appointments. I also discussed with the patient that there may be a patient responsible charge related to this service. The patient expressed understanding and agreed to proceed.   History and Present Illness:   Chief Complaint: Tick Removal   HPI Patient removed a tick from back , and it had welps around it. It is some better today. Has been a 5 days since she removed it. She siad tick was only on her for 3-4 hours.    Review of Systems  Constitutional: Negative for chills, diaphoresis, fever and weight loss.  Eyes: Negative for blurred vision, double vision and pain.  Respiratory: Negative for shortness of breath.   Cardiovascular: Negative for chest pain, palpitations, orthopnea and leg swelling.  Gastrointestinal: Negative for abdominal pain.  Skin: Negative for rash.  Neurological: Negative for dizziness, sensory change, loss of consciousness, weakness and headaches.  Endo/Heme/Allergies: Negative for polydipsia. Does not bruise/bleed easily.    Psychiatric/Behavioral: Negative for memory loss. The patient does not have insomnia.   All other systems reviewed and are negative.    Observations/Objective: Alert and oriented- answers all questions appropriately No distress    Assessment and Plan: Hayley Crawford in today with chief complaint of Tick Removal   1. Tick bite, initial encounter  watch area Discussed signs and symptoms of lyme and RMSF- she was told to report any of this ASAP.     Follow Up Instructions: prn    I discussed the assessment and treatment plan with the patient. The patient was provided an opportunity to ask questions and all were answered. The patient agreed with the plan and demonstrated an understanding of the instructions.   The patient was advised to call back or seek an in-person evaluation if the symptoms worsen or if the condition fails to improve as anticipated.  The above assessment and management plan was discussed with the patient. The patient verbalized understanding of and has agreed to the management plan. Patient is aware to call the clinic if symptoms persist or worsen. Patient is aware when to return to the clinic for a follow-up visit. Patient educated on when it is appropriate to go to the emergency department.   Time call ended:  10:00  I provided 15 minutes of non-face-to-face time during this encounter.    Mary-Margaret Hassell Done, FNP

## 2019-11-10 ENCOUNTER — Telehealth: Payer: Self-pay | Admitting: Family

## 2019-11-10 DIAGNOSIS — M5441 Lumbago with sciatica, right side: Secondary | ICD-10-CM

## 2019-11-10 NOTE — Telephone Encounter (Signed)
Requesting ibuprofen refill?

## 2019-11-11 MED ORDER — IBUPROFEN 600 MG PO TABS: 600.0000 mg | ORAL_TABLET | Freq: Three times a day (TID) | ORAL | 2 refills | Status: DC | PRN

## 2019-11-11 NOTE — Telephone Encounter (Signed)
Prescription sent to pharmacy.

## 2019-11-15 ENCOUNTER — Other Ambulatory Visit: Payer: Self-pay | Admitting: Family

## 2019-11-15 DIAGNOSIS — K219 Gastro-esophageal reflux disease without esophagitis: Secondary | ICD-10-CM

## 2019-12-03 ENCOUNTER — Other Ambulatory Visit (HOSPITAL_COMMUNITY): Payer: Self-pay | Admitting: Family

## 2019-12-03 DIAGNOSIS — Z1231 Encounter for screening mammogram for malignant neoplasm of breast: Secondary | ICD-10-CM

## 2019-12-07 DIAGNOSIS — M79671 Pain in right foot: Secondary | ICD-10-CM | POA: Diagnosis not present

## 2019-12-07 DIAGNOSIS — M7731 Calcaneal spur, right foot: Secondary | ICD-10-CM | POA: Diagnosis not present

## 2019-12-17 ENCOUNTER — Other Ambulatory Visit: Payer: Self-pay

## 2019-12-17 ENCOUNTER — Ambulatory Visit (HOSPITAL_COMMUNITY)
Admission: RE | Admit: 2019-12-17 | Discharge: 2019-12-17 | Disposition: A | Discharge status: Home / Self Care | Payer: BC Managed Care – PPO | Source: Ambulatory Visit | Attending: Family | Admitting: Family

## 2019-12-17 DIAGNOSIS — Z1231 Encounter for screening mammogram for malignant neoplasm of breast: Secondary | ICD-10-CM | POA: Diagnosis not present

## 2019-12-28 DIAGNOSIS — M7661 Achilles tendinitis, right leg: Secondary | ICD-10-CM | POA: Diagnosis not present

## 2019-12-28 DIAGNOSIS — M79671 Pain in right foot: Secondary | ICD-10-CM | POA: Diagnosis not present

## 2020-01-04 ENCOUNTER — Encounter: Payer: Self-pay | Admitting: Family

## 2020-01-04 ENCOUNTER — Ambulatory Visit (INDEPENDENT_AMBULATORY_CARE_PROVIDER_SITE_OTHER): Payer: BC Managed Care – PPO | Admitting: Family

## 2020-01-04 ENCOUNTER — Other Ambulatory Visit: Payer: Self-pay

## 2020-01-04 VITALS — BP 114/73 | HR 81 | Temp 98.4°F | Ht 62.0 in | Wt 248.2 lb

## 2020-01-04 DIAGNOSIS — J449 Chronic obstructive pulmonary disease, unspecified: Secondary | ICD-10-CM | POA: Diagnosis not present

## 2020-01-04 DIAGNOSIS — M5441 Lumbago with sciatica, right side: Secondary | ICD-10-CM

## 2020-01-04 DIAGNOSIS — Z0000 Encounter for general adult medical examination without abnormal findings: Secondary | ICD-10-CM

## 2020-01-04 DIAGNOSIS — I10 Essential (primary) hypertension: Secondary | ICD-10-CM

## 2020-01-04 DIAGNOSIS — K219 Gastro-esophageal reflux disease without esophagitis: Secondary | ICD-10-CM

## 2020-01-04 DIAGNOSIS — J441 Chronic obstructive pulmonary disease with (acute) exacerbation: Secondary | ICD-10-CM | POA: Diagnosis not present

## 2020-01-04 DIAGNOSIS — E785 Hyperlipidemia, unspecified: Secondary | ICD-10-CM

## 2020-01-04 DIAGNOSIS — E538 Deficiency of other specified B group vitamins: Secondary | ICD-10-CM | POA: Diagnosis not present

## 2020-01-04 DIAGNOSIS — D509 Iron deficiency anemia, unspecified: Secondary | ICD-10-CM

## 2020-01-04 DIAGNOSIS — E559 Vitamin D deficiency, unspecified: Secondary | ICD-10-CM

## 2020-01-04 DIAGNOSIS — Z0001 Encounter for general adult medical examination with abnormal findings: Secondary | ICD-10-CM

## 2020-01-04 DIAGNOSIS — F411 Generalized anxiety disorder: Secondary | ICD-10-CM

## 2020-01-04 DIAGNOSIS — Z6841 Body Mass Index (BMI) 40.0 and over, adult: Secondary | ICD-10-CM

## 2020-01-04 MED ORDER — LOSARTAN POTASSIUM 100 MG PO TABS: 100.0000 mg | ORAL_TABLET | Freq: Every day | ORAL | 3 refills | Status: DC

## 2020-01-04 MED ORDER — ALBUTEROL SULFATE HFA 108 (90 BASE) MCG/ACT IN AERS: 2.0000 | INHALATION_SPRAY | Freq: Four times a day (QID) | RESPIRATORY_TRACT | 3 refills | Status: DC | PRN

## 2020-01-04 MED ORDER — IBUPROFEN 600 MG PO TABS: 600.0000 mg | ORAL_TABLET | Freq: Three times a day (TID) | ORAL | 2 refills | Status: DC | PRN

## 2020-01-04 MED ORDER — HYDROCHLOROTHIAZIDE 25 MG PO TABS: 25.0000 mg | ORAL_TABLET | Freq: Every day | ORAL | 3 refills | Status: DC

## 2020-01-04 MED ORDER — OMEPRAZOLE 20 MG PO CPDR: 20.0000 mg | DELAYED_RELEASE_CAPSULE | Freq: Every day | ORAL | 3 refills | Status: DC

## 2020-01-04 MED ORDER — PROMETHAZINE HCL 25 MG PO TABS: ORAL_TABLET | ORAL | 2 refills | Status: DC

## 2020-01-04 MED ORDER — BUSPIRONE HCL 10 MG PO TABS: 10.0000 mg | ORAL_TABLET | Freq: Three times a day (TID) | ORAL | 1 refills | Status: DC | PRN

## 2020-01-04 NOTE — Progress Notes (Signed)
Subjective:    Patient ID: Hayley Crawford, female    DOB: 1966/02/26, 54 y.o.   MRN: [id number]  Chief Complaint  Patient presents with  . Annual Exam    no pap   PT presents to the office today for CPE without pap. She is followed by Podiatry for right heel spur.  Hypertension This is a chronic problem. The current episode started more than 1 year ago. The problem has been resolved since onset. The problem is controlled. Associated symptoms include anxiety and malaise/fatigue. Pertinent negatives include no peripheral edema or shortness of breath. Risk factors for coronary artery disease include obesity, sedentary lifestyle and dyslipidemia. The current treatment provides moderate improvement. There is no history of CVA or heart failure.  Gastroesophageal Reflux She complains of belching and heartburn. This is a chronic problem. The current episode started more than 1 year ago. The problem occurs occasionally. The problem has been waxing and waning. Risk factors include obesity. She has tried a PPI for the symptoms. The treatment provided moderate relief.  Anemia Presents for follow-up visit. Symptoms include malaise/fatigue. There has been no bruising/bleeding easily or leg swelling. There is no history of heart failure.  Hyperlipidemia This is a chronic problem. The current episode started more than 1 year ago. The problem is uncontrolled. Recent lipid tests were reviewed and are high. Exacerbating diseases include obesity. Pertinent negatives include no shortness of breath. Current antihyperlipidemic treatment includes statins. The current treatment provides mild improvement of lipids. Risk factors for coronary artery disease include hypertension, a sedentary lifestyle, post-menopausal, dyslipidemia and diabetes mellitus.  Anxiety Presents for follow-up visit. Symptoms include depressed mood, excessive worry, irritability, nervous/anxious behavior and restlessness. Patient reports no shortness  of breath. Symptoms occur occasionally. The severity of symptoms is moderate. The quality of sleep is good.   Her past medical history is significant for anemia.   COPD Uses albuterol as needed. Quit smoking. States her breathing is stable.    Review of Systems  Constitutional: Positive for irritability and malaise/fatigue.  Respiratory: Negative for shortness of breath.   Gastrointestinal: Positive for heartburn.  Hematological: Does not bruise/bleed easily.  Psychiatric/Behavioral: The patient is nervous/anxious.   All other systems reviewed and are negative.  Family History  Problem Relation Age of Onset  . Diabetes Other   . Cancer Mother        lung  . Brain cancer Mother        tumor   Social History   Socioeconomic History  . Marital status: Single    Spouse name: Not on file  . Number of children: Not on file  . Years of education: Not on file  . Highest education level: Not on file  Occupational History  . Not on file  Tobacco Use  . Smoking status: Former Smoker    Packs/day: 1.50    Types: Cigarettes    Quit date: 01/09/2018    Years since quitting: 1.9  . Smokeless tobacco: Never Used  Vaping Use  . Vaping Use: Never used  Substance and Sexual Activity  . Alcohol use: Yes  . Drug use: No  . Sexual activity: Never  Other Topics Concern  . Not on file  Social History Narrative  . Not on file   Social Determinants of Health   Financial Resource Strain:   . Difficulty of Paying Living Expenses: Not on file  Food Insecurity:   . Worried About Charity fundraiser in the Last Year: Not on  file  . [place] in the Last Year: Not on file  Transportation Needs:   . Lack of Transportation (Medical): Not on file  . Lack of Transportation (Non-Medical): Not on file  Physical Activity:   . Days of Exercise per Week: Not on file  . Minutes of Exercise per Session: Not on file  Stress:   . Feeling of Stress : Not on file  Social Connections:   .  Frequency of Communication with Friends and Family: Not on file  . Frequency of Social Gatherings with Friends and Family: Not on file  . Attends Religious Services: Not on file  . Active Member of Clubs or Organizations: Not on file  . Attends Archivist Meetings: Not on file  . Marital Status: Not on file       Objective:   Physical Exam Vitals reviewed.  Constitutional:      General: She is not in acute distress.    Appearance: She is well-developed. She is obese.  HENT:     Head: Normocephalic and atraumatic.     Right Ear: External ear normal.  Eyes:     Pupils: Pupils are equal, round, and reactive to light.  Neck:     Thyroid: No thyromegaly.  Cardiovascular:     Rate and Rhythm: Normal rate and regular rhythm.     Heart sounds: Normal heart sounds. No murmur heard.   Pulmonary:     Effort: Pulmonary effort is normal. No respiratory distress.     Breath sounds: Normal breath sounds. No wheezing.  Abdominal:     General: Bowel sounds are normal. There is no distension.     Palpations: Abdomen is soft.     Tenderness: There is no abdominal tenderness.  Musculoskeletal:        General: No tenderness. Normal range of motion.     Cervical back: Normal range of motion and neck supple.  Skin:    General: Skin is warm and dry.  Neurological:     Mental Status: She is alert and oriented to person, place, and time.     Cranial Nerves: No cranial nerve deficit.     Deep Tendon Reflexes: Reflexes are normal and symmetric.  Psychiatric:        Behavior: Behavior normal.        Thought Content: Thought content normal.        Judgment: Judgment normal.          BP 114/73   Pulse 81   Temp 98.4 F (36.9 C) (Temporal)   Ht _0  (1.575 m)   Wt 248 lb 3.2 oz (112.6 kg)   LMP 05/31/2014   SpO2 96%   BMI 45.40 kg/m   Assessment & Plan:  Hayley Crawford comes in today with chief complaint of Annual Exam (no pap)   Diagnosis and orders addressed:  1. COPD  exacerbation (HCC) - albuterol (VENTOLIN HFA) 108 (90 Base) MCG/ACT inhaler; Inhale 2 puffs into the lungs every 6 (six) hours as needed for wheezing or shortness of breath.  Dispense: 1 each; Refill: 3 - CMP14+EGFR - CBC with Differential/Platelet  2. Chronic obstructive pulmonary disease, unspecified COPD type (HCC) - albuterol (VENTOLIN HFA) 108 (90 Base) MCG/ACT inhaler; Inhale 2 puffs into the lungs every 6 (six) hours as needed for wheezing or shortness of breath.  Dispense: 1 each; Refill: 3 - CMP14+EGFR - CBC with Differential/Platelet  3. Right-sided low back pain with right-sided sciatica - CMP14+EGFR -  CBC with Differential/Platelet - ibuprofen (ADVIL) 600 MG tablet; Take 1 tablet (600 mg total) by mouth every 8 (eight) hours as needed.  Dispense: 90 tablet; Refill: 2  4. Essential hypertension - CMP14+EGFR - CBC with Differential/Platelet - losartan (COZAAR) 100 MG tablet; Take 1 tablet (100 mg total) by mouth daily.  Dispense: 90 tablet; Refill: 3 - hydrochlorothiazide (HYDRODIURIL) 25 MG tablet; Take 1 tablet (25 mg total) by mouth daily.  Dispense: 90 tablet; Refill: 3  5. Gastroesophageal reflux disease, unspecified whether esophagitis present - CMP14+EGFR - CBC with Differential/Platelet  6. GAD (generalized anxiety disorder) - CMP14+EGFR - CBC with Differential/Platelet - busPIRone (BUSPAR) 10 MG tablet; Take 1 tablet (10 mg total) by mouth 3 (three) times daily as needed.  Dispense: 90 tablet; Refill: 1  7. Hyperlipidemia, unspecified hyperlipidemia type  - CMP14+EGFR - CBC with Differential/Platelet - Lipid panel  8. Iron deficiency anemia, unspecified iron deficiency anemia type - CMP14+EGFR - CBC with Differential/Platelet  9. Morbid obesity with BMI of 40.0-44.9, adult (HCC) - CMP14+EGFR - CBC with Differential/Platelet  10. Vitamin D deficiency - CMP14+EGFR - CBC with Differential/Platelet - VITAMIN D 25 Hydroxy (Vit-D Deficiency,  Fractures)  11. Vitamin B 12 deficiency - CMP14+EGFR - CBC with Differential/Platelet - Vitamin B12  12. Annual physical exam - CMP14+EGFR - CBC with Differential/Platelet - Lipid panel - TSH - VITAMIN D 25 Hydroxy (Vit-D Deficiency, Fractures) - Vitamin B12  13. Gastroesophageal reflux disease - CMP14+EGFR - CBC with Differential/Platelet - omeprazole (PRILOSEC) 20 MG capsule; Take 1 capsule (20 mg total) by mouth daily.  Dispense: 90 capsule; Refill: 3   Labs pending Health Maintenance reviewed Diet and exercise encouraged  Follow up plan: 6 months    [name], FNP

## 2020-01-04 NOTE — Patient Instructions (Signed)
Health Maintenance, Female Adopting a healthy lifestyle and getting preventive care are important in promoting health and wellness. Ask your health care provider about:  The right schedule for you to have regular tests and exams.  Things you can do on your own to prevent diseases and keep yourself healthy. What should I know about diet, weight, and exercise? Eat a healthy diet   Eat a diet that includes plenty of vegetables, fruits, low-fat dairy products, and lean protein.  Do not eat a lot of foods that are high in solid fats, added sugars, or sodium. Maintain a healthy weight Body mass index (BMI) is used to identify weight problems. It estimates body fat based on height and weight. Your health care provider can help determine your BMI and help you achieve or maintain a healthy weight. Get regular exercise Get regular exercise. This is one of the most important things you can do for your health. Most adults should:  Exercise for at least 150 minutes each week. The exercise should increase your heart rate and make you sweat (moderate-intensity exercise).  Do strengthening exercises at least twice a week. This is in addition to the moderate-intensity exercise.  Spend less time sitting. Even light physical activity can be beneficial. Watch cholesterol and blood lipids Have your blood tested for lipids and cholesterol at 56 years of age, then have this test every 5 years. Have your cholesterol levels checked more often if:  Your lipid or cholesterol levels are high.  You are older than 56 years of age.  You are at high risk for heart disease. What should I know about cancer screening? Depending on your health history and family history, you may need to have cancer screening at various ages. This may include screening for:  Breast cancer.  Cervical cancer.  Colorectal cancer.  Skin cancer.  Lung cancer. What should I know about heart disease, diabetes, and high blood  pressure? Blood pressure and heart disease  High blood pressure causes heart disease and increases the risk of stroke. This is more likely to develop in people who have high blood pressure readings, are of African descent, or are overweight.  Have your blood pressure checked: ? Every 3-5 years if you are 18-39 years of age. ? Every year if you are 40 years old or older. Diabetes Have regular diabetes screenings. This checks your fasting blood sugar level. Have the screening done:  Once every three years after age 40 if you are at a normal weight and have a low risk for diabetes.  More often and at a younger age if you are overweight or have a high risk for diabetes. What should I know about preventing infection? Hepatitis B If you have a higher risk for hepatitis B, you should be screened for this virus. Talk with your health care provider to find out if you are at risk for hepatitis B infection. Hepatitis C Testing is recommended for:  Everyone born from 1945 through 1965.  Anyone with known risk factors for hepatitis C. Sexually transmitted infections (STIs)  Get screened for STIs, including gonorrhea and chlamydia, if: ? You are sexually active and are younger than 56 years of age. ? You are older than 56 years of age and your health care provider tells you that you are at risk for this type of infection. ? Your sexual activity has changed since you were last screened, and you are at increased risk for chlamydia or gonorrhea. Ask your health care provider if   you are at risk.  Ask your health care provider about whether you are at high risk for HIV. Your health care provider may recommend a prescription medicine to help prevent HIV infection. If you choose to take medicine to prevent HIV, you should first get tested for HIV. You should then be tested every 3 months for as long as you are taking the medicine. Pregnancy  If you are about to stop having your period (premenopausal) and  you may become pregnant, seek counseling before you get pregnant.  Take 400 to 800 micrograms (mcg) of folic acid every day if you become pregnant.  Ask for birth control (contraception) if you want to prevent pregnancy. Osteoporosis and menopause Osteoporosis is a disease in which the bones lose minerals and strength with aging. This can result in bone fractures. If you are 65 years old or older, or if you are at risk for osteoporosis and fractures, ask your health care provider if you should:  Be screened for bone loss.  Take a calcium or vitamin D supplement to lower your risk of fractures.  Be given hormone replacement therapy (HRT) to treat symptoms of menopause. Follow these instructions at home: Lifestyle  Do not use any products that contain nicotine or tobacco, such as cigarettes, e-cigarettes, and chewing tobacco. If you need help quitting, ask your health care provider.  Do not use street drugs.  Do not share needles.  Ask your health care provider for help if you need support or information about quitting drugs. Alcohol use  Do not drink alcohol if: ? Your health care provider tells you not to drink. ? You are pregnant, may be pregnant, or are planning to become pregnant.  If you drink alcohol: ? Limit how much you use to 0-1 drink a day. ? Limit intake if you are breastfeeding.  Be aware of how much alcohol is in your drink. In the [place] one drink equals one 12 oz bottle of beer (355 mL), one 5 oz glass of wine (148 mL), or one 1 oz glass of hard liquor (44 mL). General instructions  Schedule regular health, dental, and eye exams.  Stay current with your vaccines.  Tell your health care provider if: ? You often feel depressed. ? You have ever been abused or do not feel safe at home. Summary  Adopting a healthy lifestyle and getting preventive care are important in promoting health and wellness.  Follow your health care provider's instructions about healthy  diet, exercising, and getting tested or screened for diseases.  Follow your health care provider's instructions on monitoring your cholesterol and blood pressure. This information is not intended to replace advice given to you by your health care provider. Make sure you discuss any questions you have with your health care provider. Document Revised: 04/15/2018 Document Reviewed: 04/15/2018 Elsevier Patient Education  2020 Elsevier Inc.  

## 2020-01-05 LAB — CMP14+EGFR
ALT: 27 IU/L (ref 0–32)
AST: 17 IU/L (ref 0–40)
Albumin/Globulin Ratio: 1.6 (ref 1.2–2.2)
Albumin: 4.5 g/dL (ref 3.8–4.9)
Alkaline Phosphatase: 102 IU/L (ref 48–121)
BUN/Creatinine Ratio: 16 (ref 9–23)
BUN: 13 mg/dL (ref 6–24)
Bilirubin Total: 0.3 mg/dL (ref 0.0–1.2)
CO2: 24 mmol/L (ref 20–29)
Calcium: 10.1 mg/dL (ref 8.7–10.2)
Chloride: 100 mmol/L (ref 96–106)
Creatinine, Ser: 0.82 mg/dL (ref 0.57–1.00)
GFR calc Af Amer: 93 mL/min/{1.73_m2} (ref >59)
GFR calc non Af Amer: 80 mL/min/{1.73_m2} (ref >59)
Globulin, Total: 2.8 g/dL (ref 1.5–4.5)
Glucose: 88 mg/dL (ref 65–99)
Potassium: 4.1 mmol/L (ref 3.5–5.2)
Sodium: 138 mmol/L (ref 134–144)
Total Protein: 7.3 g/dL (ref 6.0–8.5)

## 2020-01-05 LAB — CBC WITH DIFFERENTIAL/PLATELET
Basophils Absolute: 0.1 10*3/uL (ref 0.0–0.2)
Basos: 1 %
EOS (ABSOLUTE): 0.3 10*3/uL (ref 0.0–0.4)
Eos: 3 %
Hematocrit: 40.6 % (ref 34.0–46.6)
Hemoglobin: 14.1 g/dL (ref 11.1–15.9)
Immature Grans (Abs): 0 10*3/uL (ref 0.0–0.1)
Immature Granulocytes: 0 %
Lymphocytes Absolute: 2.4 10*3/uL (ref 0.7–3.1)
Lymphs: 25 %
MCH: 33 pg (ref 26.6–33.0)
MCHC: 34.7 g/dL (ref 31.5–35.7)
MCV: 95 fL (ref 79–97)
Monocytes Absolute: 0.6 10*3/uL (ref 0.1–0.9)
Monocytes: 7 %
Neutrophils Absolute: 6.2 10*3/uL (ref 1.4–7.0)
Neutrophils: 64 %
Platelets: 311 10*3/uL (ref 150–450)
RBC: 4.27 x10E6/uL (ref 3.77–5.28)
RDW: 12.6 % (ref 11.7–15.4)
WBC: 9.5 10*3/uL (ref 3.4–10.8)

## 2020-01-05 LAB — LIPID PANEL
Chol/HDL Ratio: 4.9 ratio — ABNORMAL HIGH (ref 0.0–4.4)
Cholesterol, Total: 273 mg/dL — ABNORMAL HIGH (ref 100–199)
HDL: 56 mg/dL (ref >39)
LDL Chol Calc (NIH): 146 mg/dL — ABNORMAL HIGH (ref 0–99)
Triglycerides: 387 mg/dL — ABNORMAL HIGH (ref 0–149)
VLDL Cholesterol Cal: 71 mg/dL — ABNORMAL HIGH (ref 5–40)

## 2020-01-05 LAB — TSH: TSH: 2.91 u[IU]/mL (ref 0.450–4.500)

## 2020-01-05 LAB — SPECIMEN STATUS REPORT

## 2020-01-05 LAB — VITAMIN D 25 HYDROXY (VIT D DEFICIENCY, FRACTURES): Vit D, 25-Hydroxy: 18.6 ng/mL — ABNORMAL LOW (ref 30.0–100.0)

## 2020-01-05 LAB — VITAMIN B12: Vitamin B-12: 229 pg/mL — ABNORMAL LOW (ref 232–1245)

## 2020-01-06 ENCOUNTER — Other Ambulatory Visit: Payer: Self-pay | Admitting: Family

## 2020-01-06 MED ORDER — ATORVASTATIN CALCIUM 20 MG PO TABS: 20.0000 mg | ORAL_TABLET | Freq: Every day | ORAL | 3 refills | Status: DC

## 2020-01-06 MED ORDER — VITAMIN D (ERGOCALCIFEROL) 1.25 MG (50000 UNIT) PO CAPS: 50000.0000 [IU] | ORAL_CAPSULE | ORAL | 3 refills | Status: DC

## 2020-01-22 DIAGNOSIS — H5210 Myopia, unspecified eye: Secondary | ICD-10-CM | POA: Diagnosis not present

## 2020-04-07 ENCOUNTER — Other Ambulatory Visit: Payer: Self-pay

## 2020-04-07 ENCOUNTER — Ambulatory Visit: Payer: BC Managed Care – PPO | Admitting: Family

## 2020-04-07 ENCOUNTER — Encounter: Payer: Self-pay | Admitting: Family

## 2020-04-07 VITALS — BP 134/74 | HR 81 | Temp 98.7°F | Ht 62.0 in | Wt 254.8 lb

## 2020-04-07 DIAGNOSIS — J449 Chronic obstructive pulmonary disease, unspecified: Secondary | ICD-10-CM | POA: Diagnosis not present

## 2020-04-07 DIAGNOSIS — E538 Deficiency of other specified B group vitamins: Secondary | ICD-10-CM

## 2020-04-07 DIAGNOSIS — F39 Unspecified mood [affective] disorder: Secondary | ICD-10-CM

## 2020-04-07 DIAGNOSIS — E785 Hyperlipidemia, unspecified: Secondary | ICD-10-CM

## 2020-04-07 DIAGNOSIS — L719 Rosacea, unspecified: Secondary | ICD-10-CM

## 2020-04-07 DIAGNOSIS — F411 Generalized anxiety disorder: Secondary | ICD-10-CM

## 2020-04-07 DIAGNOSIS — I10 Essential (primary) hypertension: Secondary | ICD-10-CM | POA: Diagnosis not present

## 2020-04-07 DIAGNOSIS — Z6841 Body Mass Index (BMI) 40.0 and over, adult: Secondary | ICD-10-CM

## 2020-04-07 DIAGNOSIS — D509 Iron deficiency anemia, unspecified: Secondary | ICD-10-CM

## 2020-04-07 DIAGNOSIS — K219 Gastro-esophageal reflux disease without esophagitis: Secondary | ICD-10-CM | POA: Diagnosis not present

## 2020-04-07 DIAGNOSIS — Z720 Tobacco use: Secondary | ICD-10-CM

## 2020-04-07 MED ORDER — METRONIDAZOLE 0.75 % EX CREA: TOPICAL_CREAM | Freq: Two times a day (BID) | CUTANEOUS | 3 refills | Status: DC

## 2020-04-07 NOTE — Progress Notes (Signed)
Subjective:    Patient ID: Hayley Crawford, female    DOB: Nov 10, 1965, 55 y.o.   MRN: [id number]  Chief Complaint  Patient presents with  . Hypertension    3 mth follow up, no concerns,    PT presents to the office today for chronic follow up. She is followed by Podiatry for right heel spur. She has rosacea and requesting medication today.   Hypertension This is a chronic problem. The current episode started more than 1 year ago. The problem has been resolved since onset. The problem is controlled. Associated symptoms include anxiety. Pertinent negatives include no malaise/fatigue, peripheral edema or shortness of breath. Risk factors for coronary artery disease include dyslipidemia, obesity and sedentary lifestyle. The current treatment provides moderate improvement. There is no history of heart failure.  Gastroesophageal Reflux She complains of belching and heartburn. This is a chronic problem. The current episode started more than 1 year ago. The problem occurs occasionally. Risk factors include obesity. She has tried a PPI for the symptoms.  Anemia Presents for follow-up visit. There has been no bruising/bleeding easily, fever or malaise/fatigue. There is no history of heart failure.  Hyperlipidemia This is a chronic problem. The current episode started more than 1 year ago. The problem is uncontrolled. Recent lipid tests were reviewed and are high. Exacerbating diseases include obesity. Pertinent negatives include no shortness of breath. Current antihyperlipidemic treatment includes statins. The current treatment provides moderate improvement of lipids. Risk factors for coronary artery disease include dyslipidemia, hypertension, a sedentary lifestyle and post-menopausal.  Anxiety Presents for follow-up visit. Symptoms include depressed mood and excessive worry. Patient reports no shortness of breath. Symptoms occur most days.   Her past medical history is significant for anemia.  COPD Quit  smoking 2 years ago. State her breathing is stable.     Review of Systems  Constitutional: Negative for fever and malaise/fatigue.  Respiratory: Negative for shortness of breath.   Gastrointestinal: Positive for heartburn.  Hematological: Does not bruise/bleed easily.  All other systems reviewed and are negative.      Objective:   Physical Exam Vitals reviewed.  Constitutional:      General: She is not in acute distress.    Appearance: She is well-developed. She is obese.  HENT:     Head: Normocephalic and atraumatic.     Right Ear: Tympanic membrane normal.     Left Ear: Tympanic membrane normal.  Eyes:     Pupils: Pupils are equal, round, and reactive to light.  Neck:     Thyroid: No thyromegaly.  Cardiovascular:     Rate and Rhythm: Normal rate and regular rhythm.     Heart sounds: Normal heart sounds. No murmur heard.   Pulmonary:     Effort: Pulmonary effort is normal. No respiratory distress.     Breath sounds: Normal breath sounds. No wheezing.  Abdominal:     General: Bowel sounds are normal. There is no distension.     Palpations: Abdomen is soft.     Tenderness: There is no abdominal tenderness.  Musculoskeletal:        General: No tenderness. Normal range of motion.     Cervical back: Normal range of motion and neck supple.  Skin:    General: Skin is warm and dry.     Findings: Erythema and rash (erythemas rash on bilateral cheeks) present.  Neurological:     Mental Status: She is alert and oriented to person, place, and time.  Cranial Nerves: No cranial nerve deficit.     Deep Tendon Reflexes: Reflexes are normal and symmetric.  Psychiatric:        Behavior: Behavior normal.        Thought Content: Thought content normal.        Judgment: Judgment normal.       BP 134/74   Pulse 81   Temp 98.7 F (37.1 C) (Temporal)   Ht _0  (1.575 m)   Wt 254 lb 12.8 oz (115.6 kg)   LMP 05/31/2014   SpO2 96%   BMI 46.60 kg/m      Assessment &  Plan:  Hayley Crawford comes in today with chief complaint of Hypertension (3 mth follow up, no concerns, ) and COPD   Diagnosis and orders addressed:  1. Primary hypertension - CMP14+EGFR  2. Chronic obstructive pulmonary disease, unspecified COPD type ([place]) - CMP14+EGFR  3. Gastroesophageal reflux disease, unspecified whether esophagitis present - CMP14+EGFR  4. GAD (generalized anxiety disorder) - CMP14+EGFR  5. Hyperlipidemia, unspecified hyperlipidemia type - CMP14+EGFR - Lipid panel  6. Iron deficiency anemia, unspecified iron deficiency anemia type - CMP14+EGFR  7. Vitamin B 12 deficiency - CMP14+EGFR  8. Tobacco user - CMP14+EGFR  9. Morbid obesity with BMI of 40.0-44.9, adult ([place]) - CMP14+EGFR  10. Mood disorder (HCC) - CMP14+EGFR  11. Rosacea Start metrocream today Avoid triggers - metroNIDAZOLE (METROCREAM) 0.75 % cream; Apply topically 2 (two) times daily.  Dispense: 45 g; Refill: 3 - CMP14+EGFR   Labs pending Health Maintenance reviewed Diet and exercise encouraged  Follow up plan: 6 months    [name], FNP

## 2020-04-07 NOTE — Patient Instructions (Signed)
Rosacea Rosacea is a long-term (chronic) condition that affects the skin of the face, including the cheeks, nose, forehead, and chin. This condition can also affect the eyes. Rosacea causes blood vessels near the surface of the skin to enlarge, which results in redness. What are the causes? The cause of this condition is not known. Certain triggers can make rosacea worse, including:  Hot baths.  Exercise.  Sunlight.  Very hot or cold temperatures.  Hot or spicy foods and drinks.  Drinking alcohol.  Stress.  Taking blood pressure medicine.  Long-term use of topical steroids on the face. What increases the risk? You are more likely to develop this condition if you:  Are older than 56 years of age.  Are a woman.  Have light-colored skin (light complexion).  Have a family history of rosacea. What are the signs or symptoms? Symptoms of this condition include:  Redness of the face.  Red bumps or pimples on the face.  A red, enlarged nose.  Blushing easily.  Red lines on the skin.  Irritated, burning, or itchy feeling in the eyes.  Swollen eyelids.  Drainage from the eyes.  Feeling like there is something in your eye. How is this diagnosed? This condition is diagnosed with a medical history and physical exam. How is this treated? There is no cure for this condition, but treatment can help to control your symptoms. Your health care provider may recommend that you see a skin specialist (dermatologist). Treatment may include:  Medicines that are applied to the skin or taken by mouth (orally). This can include antibiotic medicines.  Laser treatment to improve the appearance of the skin.  Surgery. This is rare. Your health care provider will also recommend the best way to take care of your skin. Even after your skin improves, you will likely need to continue treatment to prevent your rosacea from coming back. Follow these instructions at home: Skin care Take care  of your skin as told by your health care provider. You may be told to do these things:  Wash your skin gently two or more times each day.  Use mild soap.  Use a sunscreen or sunblock with SPF 30 or greater.  Use gentle cosmetics that are meant for sensitive skin.  Shave with an electric shaver instead of a blade. Lifestyle  Try to keep track of what foods trigger this condition. Avoid any triggers. These may include: ? Spicy foods. ? Seafood. ? Cheese. ? Hot liquids. ? Nuts. ? Chocolate. ? Iodized salt.  Do not drink alcohol.  Avoid extremely cold or hot temperatures.  Try to reduce your stress. If you need help, talk with your health care provider.  When you exercise, do these things to stay cool: ? Limit sun exposure to your face. ? Use a fan. ? Do shorter and more frequent intervals of exercise. General instructions  Take and apply over-the-counter and prescription medicines only as told by your health care provider.  If you were prescribed an antibiotic medicine, apply it or take it as told by your health care provider. Do not stop using the antibiotic even if your condition improves.  If your eyelids are affected, apply warm compresses to them. Do this as told by your health care provider.  Keep all follow-up visits as told by your health care provider. This is important. Contact a health care provider if:  Your symptoms get worse.  Your symptoms do not improve after 2 months of treatment.  You have new   symptoms.  You have any changes in vision or you have problems with your eyes, such as redness or itching.  You feel depressed.  You lose your appetite.  You have trouble concentrating. Summary  Rosacea is a long-term (chronic) condition that affects the skin of the face, including the cheeks, nose, forehead, and chin.  Take care of your skin as told by your health care provider.  Take and apply over-the-counter and prescription medicines only as told  by your health care provider.  Contact a health care provider if your symptoms get worse or if you have any changes in vision or other problems with your eyes, such as redness or itching.  Keep all follow-up visits as told by your health care provider. This is important. This information is not intended to replace advice given to you by your health care provider. Make sure you discuss any questions you have with your health care provider. Document Revised: 09/24/2017 Document Reviewed: 09/24/2017 Elsevier Patient Education  2020 Elsevier Inc.  

## 2020-04-08 LAB — CMP14+EGFR
ALT: 27 IU/L (ref 0–32)
AST: 18 IU/L (ref 0–40)
Albumin/Globulin Ratio: 1.5 (ref 1.2–2.2)
Albumin: 4.1 g/dL (ref 3.8–4.9)
Alkaline Phosphatase: 95 IU/L (ref 44–121)
BUN/Creatinine Ratio: 22 (ref 9–23)
BUN: 17 mg/dL (ref 6–24)
Bilirubin Total: 0.2 mg/dL (ref 0.0–1.2)
CO2: 25 mmol/L (ref 20–29)
Calcium: 9.4 mg/dL (ref 8.7–10.2)
Chloride: 103 mmol/L (ref 96–106)
Creatinine, Ser: 0.77 mg/dL (ref 0.57–1.00)
GFR calc Af Amer: 100 mL/min/{1.73_m2} (ref >59)
GFR calc non Af Amer: 87 mL/min/{1.73_m2} (ref >59)
Globulin, Total: 2.8 g/dL (ref 1.5–4.5)
Glucose: 73 mg/dL (ref 65–99)
Potassium: 4.2 mmol/L (ref 3.5–5.2)
Sodium: 143 mmol/L (ref 134–144)
Total Protein: 6.9 g/dL (ref 6.0–8.5)

## 2020-04-08 LAB — LIPID PANEL
Chol/HDL Ratio: 3.8 ratio (ref 0.0–4.4)
Cholesterol, Total: 244 mg/dL — ABNORMAL HIGH (ref 100–199)
HDL: 64 mg/dL (ref >39)
LDL Chol Calc (NIH): 136 mg/dL — ABNORMAL HIGH (ref 0–99)
Triglycerides: 247 mg/dL — ABNORMAL HIGH (ref 0–149)
VLDL Cholesterol Cal: 44 mg/dL — ABNORMAL HIGH (ref 5–40)

## 2020-05-09 ENCOUNTER — Other Ambulatory Visit: Payer: Self-pay | Admitting: *Deleted

## 2020-05-09 DIAGNOSIS — I10 Essential (primary) hypertension: Secondary | ICD-10-CM

## 2020-05-09 NOTE — Telephone Encounter (Signed)
Fax from [place] [facility] pharmacy RE: Losartan 100 mg tab Note from pharmacy: 100 mg on backorder Please advise on alternative

## 2020-05-11 MED ORDER — LOSARTAN POTASSIUM 100 MG PO TABS: 100.0000 mg | ORAL_TABLET | Freq: Every day | ORAL | 3 refills | Status: DC

## 2020-07-21 ENCOUNTER — Encounter: Payer: Self-pay | Admitting: Family

## 2020-07-21 ENCOUNTER — Ambulatory Visit: Payer: BC Managed Care – PPO | Admitting: Family

## 2020-07-21 ENCOUNTER — Other Ambulatory Visit: Payer: Self-pay

## 2020-07-21 VITALS — BP 113/74 | HR 68 | Temp 98.0°F | Resp 20 | Ht 62.0 in | Wt 233.0 lb

## 2020-07-21 DIAGNOSIS — Z1152 Encounter for screening for COVID-19: Secondary | ICD-10-CM

## 2020-07-21 DIAGNOSIS — R634 Abnormal weight loss: Secondary | ICD-10-CM | POA: Diagnosis not present

## 2020-07-21 DIAGNOSIS — M7661 Achilles tendinitis, right leg: Secondary | ICD-10-CM | POA: Diagnosis not present

## 2020-07-21 DIAGNOSIS — I10 Essential (primary) hypertension: Secondary | ICD-10-CM

## 2020-07-21 DIAGNOSIS — Z6841 Body Mass Index (BMI) 40.0 and over, adult: Secondary | ICD-10-CM

## 2020-07-21 DIAGNOSIS — J449 Chronic obstructive pulmonary disease, unspecified: Secondary | ICD-10-CM

## 2020-07-21 DIAGNOSIS — E8881 Metabolic syndrome: Secondary | ICD-10-CM

## 2020-07-21 MED ORDER — DICLOFENAC SODIUM 75 MG PO TBEC: 75.0000 mg | DELAYED_RELEASE_TABLET | Freq: Two times a day (BID) | ORAL | 2 refills | Status: DC

## 2020-07-21 MED ORDER — PREDNISONE 10 MG (21) PO TBPK: ORAL_TABLET | ORAL | 0 refills | Status: DC

## 2020-07-21 NOTE — Patient Instructions (Signed)
[employer]'s Emergency Medicine: Concepts and Clinical Practice (9th ed., pp. 1392-1401). [place], [place]: Else[employer] Inc. Retrieved from https://www.clinicalkey.[email or web address]  Achilles Tendinitis  Achilles tendinitis is inflammation of the tough, cord-like band that attaches the lower leg muscles to the heel bone (Achilles tendon). This is usually caused by overusing the tendon and the ankle joint. Achilles tendinitis usually gets better over time with treatment and caring for yourself at home. It can take weeks or months to heal completely. What are the causes? This condition may be caused by:  A sudden increase in exercise or activity, such as running.  Doing the same exercises or activities, such as jumping, over and over.  Not warming up calf muscles before exercising.  Exercising in shoes that are worn out or not made for exercise.  Having arthritis or a bone growth (spur) on the back of the heel bone. This can rub against the tendon and hurt it.  Age-related wear and tear. Tendons become less flexible with age and are more likely to be injured. What are the signs or symptoms? Common symptoms of this condition include:  Pain in the Achilles tendon or in the back of the leg, just above the heel. The pain usually gets worse with exercise.  Stiffness or soreness in the back of the leg, especially in the morning.  Swelling of the skin over the Achilles tendon.  Thickening of the tendon.  Trouble standing on tiptoe. How is this diagnosed? This condition is diagnosed based on your symptoms and a physical exam. You may have tests, including:  X-rays.  MRI. How is this treated? The goal of treatment is to relieve symptoms and help your injury heal. Treatment may include:  Decreasing or stopping activities that caused the tendinitis. This may mean switching to low-impact exercises like biking or swimming.  Icing the injured  area.  Doing physical therapy, including strengthening and stretching exercises.  Taking NSAIDs, such as ibuprofen, to help relieve pain and swelling.  Using supportive shoes, wraps, heel lifts, or a walking boot (air cast).  Having surgery. This may be done if your symptoms do not improve after other treatments.  Using high-energy shock wave impulses to stimulate the healing process (extracorporeal shock wave therapy). This is rare.  Having an injection of medicines that help relieve inflammation (corticosteroids). This is rare. Follow these instructions at home: If you have an air cast:  Wear the air cast as told by your health care provider. Remove it only as told by your health care provider.  Loosen it if your toes tingle, become numb, or turn cold and blue.  Keep it clean.  If the air cast is not waterproof: ? Do not let it get wet. ? Cover it with a watertight covering when you take a bath or shower. Managing pain, stiffness, and swelling  If directed, put ice on the injured area. To do this: ? If you have a removable air cast, remove it as told by your health care provider. ? Put ice in a plastic bag. ? Place a towel between your skin and the bag. ? Leave the ice on for 20 minutes, 2-3 times a day.  Move your toes often to reduce stiffness and swelling.  Raise (elevate) your foot above the level of your heart while you are sitting or lying down.   Activity  Gradually return to your normal activities as told by your health care provider. Ask your health care provider what activities are safe for you.  Do not   do activities that cause pain.  Consider doing low-impact exercises, like cycling or swimming.  Ask your health care provider when it is safe to drive if you have an air cast on your foot.  If physical therapy was prescribed, do exercises as told by your health care provider or physical therapist. General instructions  If directed, wrap your foot with an  elastic bandage or other wrap. This can help to keep your tendon from moving too much while it heals. Your health care provider will show you how to wrap your foot correctly.  Wear supportive shoes or heel lifts only as told by your health care provider.  Take over-the-counter and prescription medicines only as told by your health care provider.  Keep all follow-up visits as told by your health care provider. This is important. Contact a health care provider if you:  Have symptoms that get worse.  Have pain that does not get better with medicine.  Develop new, unexplained symptoms.  Develop warmth and swelling in your foot.  Have a fever. Get help right away if you:  Have a sudden popping sound or sensation in your Achilles tendon followed by severe pain.  Cannot move your toes or foot.  Cannot put any weight on your foot.  Your foot or toes become numb and look white or blue even after loosening your bandage or air cast. Summary  Achilles tendinitis is inflammation of the tough, cord-like band that attaches the lower leg muscles to the heel bone (Achilles tendon).  This condition is usually caused by overusing the tendon and the ankle joint. It can also be caused by arthritis or normal aging.  The most common symptoms of this condition include pain, swelling, or stiffness in the Achilles tendon or in the back of the leg.  This condition is usually treated by decreasing or stopping activities that caused the tendinitis, icing the injured area, taking NSAIDs, and doing physical therapy. This information is not intended to replace advice given to you by your health care provider. Make sure you discuss any questions you have with your health care provider. Document Revised: 09/07/2018 Document Reviewed: 09/07/2018 Elsevier Patient Education  2021 Elsevier Inc.  

## 2020-07-21 NOTE — Progress Notes (Signed)
Subjective:    Patient ID: Hayley Crawford, female    DOB: 1966-01-29, 55 y.o.   MRN: [id number]  Chief Complaint  Patient presents with  . right foot pain    HPI PT presents to the office today with right foot pain. She has had this in the past and is followed by Podiatry and was told she need to have surgery. She states she can do surgery until Fall.   She reports her pain is intermittent 6 out 10 on her achillis. She reports she is going on a Cruise and needs something to help.She also needs a COVID test before she goes.    She is currently [employer] meal plans. She reports she has lost 25 lb since starting. She is asking to have a form completed so she can use her Flex card to pay for.   She has HTN, COPD, metabolic syndrome, and hyperlipidemia. Her current weight loss when help these chronic conditions.   Review of Systems  All other systems reviewed and are negative.      Objective:   Physical Exam Vitals reviewed.  Constitutional:      General: She is not in acute distress.    Appearance: She is well-developed. She is obese.  HENT:     Head: Normocephalic and atraumatic.     Right Ear: Tympanic membrane normal.     Left Ear: Tympanic membrane normal.  Eyes:     Pupils: Pupils are equal, round, and reactive to light.  Neck:     Thyroid: No thyromegaly.  Cardiovascular:     Rate and Rhythm: Normal rate and regular rhythm.     Heart sounds: Normal heart sounds. No murmur heard.   Pulmonary:     Effort: Pulmonary effort is normal. No respiratory distress.     Breath sounds: Normal breath sounds. No wheezing.  Abdominal:     General: Bowel sounds are normal. There is no distension.     Palpations: Abdomen is soft.     Tenderness: There is no abdominal tenderness.  Musculoskeletal:        General: No tenderness. Normal range of motion.     Cervical back: Normal range of motion and neck supple.       Feet:  Feet:     Comments: Swelling and tenderness present   Skin:    General: Skin is warm and dry.  Neurological:     Mental Status: She is alert and oriented to person, place, and time.     Cranial Nerves: No cranial nerve deficit.     Deep Tendon Reflexes: Reflexes are normal and symmetric.  Psychiatric:        Behavior: Behavior normal.        Thought Content: Thought content normal.        Judgment: Judgment normal.          BP 113/74   Pulse 68   Temp 98 F (36.7 C)   Resp 20   Ht 5\' 2"  (1.575 m)   Wt 233 lb (105.7 kg)   LMP 05/31/2014   SpO2 99%   BMI 42.62 kg/m   Assessment & Plan:  CHARLEIGH CORRENTI comes in today with chief complaint of right foot pain   Diagnosis and orders addressed:  1. Achilles bursitis of right lower extremity Start diclofenac 75 mg BID  No other NSAID's  Avoid high impact  - diclofenac (VOLTAREN) 75 MG EC tablet; Take 1 tablet (75 mg total) by mouth  2 (two) times daily.  Dispense: 60 tablet; Refill: 2 - predniSONE (STERAPRED UNI-PAK 21 TAB) 10 MG (21) TBPK tablet; Use as directed  Dispense: 21 tablet; Refill: 0  2. Morbid obesity with BMI of 40.0-44.9, adult ([place])  3. Weight loss  4. Primary hypertension  5. Chronic obstructive pulmonary disease, unspecified COPD type ([place])  6. Metabolic syndrome   7. Encounter for screening for COVID-19 Pt will come to get COVID tested prior to her cruise - Novel Coronavirus, N[facility] ([facility])   Form completed to get diet plan approved to use Flex card Health Maintenance reviewed Diet and exercise encouraged  Follow up plan: As needed and keep chronic follow up   [name], FNP

## 2020-09-05 ENCOUNTER — Encounter: Payer: Self-pay | Admitting: Family Medicine

## 2020-09-11 ENCOUNTER — Encounter: Payer: Self-pay | Admitting: Family Medicine

## 2020-10-09 ENCOUNTER — Ambulatory Visit: Payer: BC Managed Care – PPO | Admitting: Family

## 2020-10-09 ENCOUNTER — Encounter: Payer: Self-pay | Admitting: Family

## 2020-10-09 ENCOUNTER — Other Ambulatory Visit: Payer: Self-pay

## 2020-10-09 VITALS — BP 130/79 | HR 91 | Temp 98.2°F | Ht 62.0 in | Wt 242.6 lb

## 2020-10-09 DIAGNOSIS — Z87891 Personal history of nicotine dependence: Secondary | ICD-10-CM | POA: Diagnosis not present

## 2020-10-09 DIAGNOSIS — D509 Iron deficiency anemia, unspecified: Secondary | ICD-10-CM

## 2020-10-09 DIAGNOSIS — F411 Generalized anxiety disorder: Secondary | ICD-10-CM | POA: Diagnosis not present

## 2020-10-09 DIAGNOSIS — Z6841 Body Mass Index (BMI) 40.0 and over, adult: Secondary | ICD-10-CM

## 2020-10-09 DIAGNOSIS — F39 Unspecified mood [affective] disorder: Secondary | ICD-10-CM

## 2020-10-09 DIAGNOSIS — J449 Chronic obstructive pulmonary disease, unspecified: Secondary | ICD-10-CM

## 2020-10-09 DIAGNOSIS — E785 Hyperlipidemia, unspecified: Secondary | ICD-10-CM

## 2020-10-09 DIAGNOSIS — K219 Gastro-esophageal reflux disease without esophagitis: Secondary | ICD-10-CM

## 2020-10-09 DIAGNOSIS — I10 Essential (primary) hypertension: Secondary | ICD-10-CM | POA: Diagnosis not present

## 2020-10-09 NOTE — Progress Notes (Signed)
Subjective:    Patient ID: Hayley Crawford, female    DOB: Nov 21, 1965, 55 y.o.   MRN: [id number]  Chief Complaint  Patient presents with  . Medical Management of Chronic Issues    PT presents to the office today for chronic follow up. Hypertension This is a chronic problem. The current episode started more than 1 year ago. The problem has been resolved since onset. The problem is controlled. Associated symptoms include anxiety. Pertinent negatives include no malaise/fatigue, peripheral edema or shortness of breath. Risk factors for coronary artery disease include dyslipidemia, obesity and sedentary lifestyle. The current treatment provides moderate improvement.  Gastroesophageal Reflux She complains of belching and heartburn. This is a chronic problem. The current episode started more than 1 year ago. The problem occurs occasionally. The symptoms are aggravated by smoking. Risk factors include obesity. She has tried a PPI for the symptoms. The treatment provided moderate relief.  Anemia Presents for follow-up visit. There has been no bruising/bleeding easily, fever or malaise/fatigue.  Hyperlipidemia This is a chronic problem. The current episode started more than 1 year ago. The problem is uncontrolled. Recent lipid tests were reviewed and are high. Pertinent negatives include no shortness of breath. Current antihyperlipidemic treatment includes statins. The current treatment provides moderate improvement of lipids. Risk factors for coronary artery disease include dyslipidemia, diabetes mellitus, hypertension, a sedentary lifestyle and post-menopausal.  Anxiety Presents for follow-up visit. Symptoms include depressed mood, excessive worry, irritability and restlessness. Patient reports no shortness of breath. Symptoms occur occasionally. The severity of symptoms is moderate. The quality of sleep is good.   Her past medical history is significant for anemia.    COPD    Review of Systems   Constitutional: Positive for irritability. Negative for fever and malaise/fatigue.  Respiratory: Negative for shortness of breath.   Gastrointestinal: Positive for heartburn.  Hematological: Does not bruise/bleed easily.  All other systems reviewed and are negative.      Objective:   Physical Exam Vitals reviewed.  Constitutional:      General: She is not in acute distress.    Appearance: She is well-developed. She is obese.  HENT:     Head: Normocephalic and atraumatic.     Right Ear: Tympanic membrane normal.     Left Ear: Tympanic membrane normal.  Eyes:     Pupils: Pupils are equal, round, and reactive to light.  Neck:     Thyroid: No thyromegaly.  Cardiovascular:     Rate and Rhythm: Normal rate and regular rhythm.     Heart sounds: Normal heart sounds. No murmur heard.   Pulmonary:     Effort: Pulmonary effort is normal. No respiratory distress.     Breath sounds: Normal breath sounds. No wheezing.  Abdominal:     General: Bowel sounds are normal. There is no distension.     Palpations: Abdomen is soft.     Tenderness: There is no abdominal tenderness.  Musculoskeletal:        General: No tenderness. Normal range of motion.     Cervical back: Normal range of motion and neck supple.  Skin:    General: Skin is warm and dry.  Neurological:     Mental Status: She is alert and oriented to person, place, and time.     Cranial Nerves: No cranial nerve deficit.     Deep Tendon Reflexes: Reflexes are normal and symmetric.  Psychiatric:        Behavior: Behavior normal.  Thought Content: Thought content normal.        Judgment: Judgment normal.       BP 130/79   Pulse 91   Temp 98.2 F (36.8 C) (Temporal)   Ht '5\' 2"'  (1.575 m)   Wt 242 lb 9.6 oz (110 kg)   LMP 05/31/2014   BMI 44.37 kg/m      Assessment & Plan:  MADONNA FLEGAL comes in today with chief complaint of Medical Management of Chronic Issues   Diagnosis and orders addressed:  1. Primary  hypertension - CMP14+EGFR - CBC with Differential/Platelet  2. Chronic obstructive pulmonary disease, unspecified COPD type ([employer]) - CMP14+EGFR - CBC with Differential/Platelet  3. Hx of smoking - CMP14+EGFR - CBC with Differential/Platelet  4. GAD (generalized anxiety disorder) - CMP14+EGFR - CBC with Differential/Platelet  5. Hyperlipidemia, unspecified hyperlipidemia type - CMP14+EGFR - CBC with Differential/Platelet  6. Iron deficiency anemia, unspecified iron deficiency anemia type - CMP14+EGFR - CBC with Differential/Platelet  7. Mood disorder ([facility]) - CMP14+EGFR - CBC with Differential/Platelet  8. Morbid obesity with BMI of 40.0-44.9, adult ([facility]) - CMP14+EGFR - CBC with Differential/Platelet  9. Gastroesophageal reflux disease, unspecified whether esophagitis present - CMP14+EGFR - CBC with Differential/Platelet   Labs pending Health Maintenance reviewed Diet and exercise encouraged  Follow up plan: 6 months    [name], FNP

## 2020-10-09 NOTE — Patient Instructions (Signed)
Health Maintenance, Female Adopting a healthy lifestyle and getting preventive care are important in promoting health and wellness. Ask your health care provider about:  The right schedule for you to have regular tests and exams.  Things you can do on your own to prevent diseases and keep yourself healthy. What should I know about diet, weight, and exercise? Eat a healthy diet  Eat a diet that includes plenty of vegetables, fruits, low-fat dairy products, and lean protein.  Do not eat a lot of foods that are high in solid fats, added sugars, or sodium.   Maintain a healthy weight Body mass index (BMI) is used to identify weight problems. It estimates body fat based on height and weight. Your health care provider can help determine your BMI and help you achieve or maintain a healthy weight. Get regular exercise Get regular exercise. This is one of the most important things you can do for your health. Most adults should:  Exercise for at least 150 minutes each week. The exercise should increase your heart rate and make you sweat (moderate-intensity exercise).  Do strengthening exercises at least twice a week. This is in addition to the moderate-intensity exercise.  Spend less time sitting. Even light physical activity can be beneficial. Watch cholesterol and blood lipids Have your blood tested for lipids and cholesterol at 57 years of age, then have this test every 5 years. Have your cholesterol levels checked more often if:  Your lipid or cholesterol levels are high.  You are older than 57 years of age.  You are at high risk for heart disease. What should I know about cancer screening? Depending on your health history and family history, you may need to have cancer screening at various ages. This may include screening for:  Breast cancer.  Cervical cancer.  Colorectal cancer.  Skin cancer.  Lung cancer. What should I know about heart disease, diabetes, and high blood  pressure? Blood pressure and heart disease  High blood pressure causes heart disease and increases the risk of stroke. This is more likely to develop in people who have high blood pressure readings, are of African descent, or are overweight.  Have your blood pressure checked: ? Every 3-5 years if you are 18-39 years of age. ? Every year if you are 40 years old or older. Diabetes Have regular diabetes screenings. This checks your fasting blood sugar level. Have the screening done:  Once every three years after age 40 if you are at a normal weight and have a low risk for diabetes.  More often and at a younger age if you are overweight or have a high risk for diabetes. What should I know about preventing infection? Hepatitis B If you have a higher risk for hepatitis B, you should be screened for this virus. Talk with your health care provider to find out if you are at risk for hepatitis B infection. Hepatitis C Testing is recommended for:  Everyone born from 1945 through 1965.  Anyone with known risk factors for hepatitis C. Sexually transmitted infections (STIs)  Get screened for STIs, including gonorrhea and chlamydia, if: ? You are sexually active and are younger than 57 years of age. ? You are older than 57 years of age and your health care provider tells you that you are at risk for this type of infection. ? Your sexual activity has changed since you were last screened, and you are at increased risk for chlamydia or gonorrhea. Ask your health care provider   if you are at risk.  Ask your health care provider about whether you are at high risk for HIV. Your health care provider may recommend a prescription medicine to help prevent HIV infection. If you choose to take medicine to prevent HIV, you should first get tested for HIV. You should then be tested every 3 months for as long as you are taking the medicine. Pregnancy  If you are about to stop having your period (premenopausal) and  you may become pregnant, seek counseling before you get pregnant.  Take 400 to 800 micrograms (mcg) of folic acid every day if you become pregnant.  Ask for birth control (contraception) if you want to prevent pregnancy. Osteoporosis and menopause Osteoporosis is a disease in which the bones lose minerals and strength with aging. This can result in bone fractures. If you are 65 years old or older, or if you are at risk for osteoporosis and fractures, ask your health care provider if you should:  Be screened for bone loss.  Take a calcium or vitamin D supplement to lower your risk of fractures.  Be given hormone replacement therapy (HRT) to treat symptoms of menopause. Follow these instructions at home: Lifestyle  Do not use any products that contain nicotine or tobacco, such as cigarettes, e-cigarettes, and chewing tobacco. If you need help quitting, ask your health care provider.  Do not use street drugs.  Do not share needles.  Ask your health care provider for help if you need support or information about quitting drugs. Alcohol use  Do not drink alcohol if: ? Your health care provider tells you not to drink. ? You are pregnant, may be pregnant, or are planning to become pregnant.  If you drink alcohol: ? Limit how much you use to 0-1 drink a day. ? Limit intake if you are breastfeeding.  Be aware of how much alcohol is in your drink. In the [place] one drink equals one 12 oz bottle of beer (355 mL), one 5 oz glass of wine (148 mL), or one 1 oz glass of hard liquor (44 mL). General instructions  Schedule regular health, dental, and eye exams.  Stay current with your vaccines.  Tell your health care provider if: ? You often feel depressed. ? You have ever been abused or do not feel safe at home. Summary  Adopting a healthy lifestyle and getting preventive care are important in promoting health and wellness.  Follow your health care provider's instructions about healthy  diet, exercising, and getting tested or screened for diseases.  Follow your health care provider's instructions on monitoring your cholesterol and blood pressure. This information is not intended to replace advice given to you by your health care provider. Make sure you discuss any questions you have with your health care provider. Document Revised: 04/15/2018 Document Reviewed: 04/15/2018 Elsevier Patient Education  2021 Elsevier Inc.  

## 2020-10-10 LAB — CBC WITH DIFFERENTIAL/PLATELET
Basophils Absolute: 0.1 10*3/uL (ref 0.0–0.2)
Basos: 1 %
EOS (ABSOLUTE): 0.1 10*3/uL (ref 0.0–0.4)
Eos: 2 %
Hematocrit: 38.4 % (ref 34.0–46.6)
Hemoglobin: 13.1 g/dL (ref 11.1–15.9)
Immature Grans (Abs): 0 10*3/uL (ref 0.0–0.1)
Immature Granulocytes: 0 %
Lymphocytes Absolute: 2.2 10*3/uL (ref 0.7–3.1)
Lymphs: 25 %
MCH: 32.1 pg (ref 26.6–33.0)
MCHC: 34.1 g/dL (ref 31.5–35.7)
MCV: 94 fL (ref 79–97)
Monocytes Absolute: 0.6 10*3/uL (ref 0.1–0.9)
Monocytes: 7 %
Neutrophils Absolute: 5.9 10*3/uL (ref 1.4–7.0)
Neutrophils: 65 %
Platelets: 291 10*3/uL (ref 150–450)
RBC: 4.08 x10E6/uL (ref 3.77–5.28)
RDW: 12.9 % (ref 11.7–15.4)
WBC: 9 10*3/uL (ref 3.4–10.8)

## 2020-10-10 LAB — CMP14+EGFR
ALT: 19 IU/L (ref 0–32)
AST: 16 IU/L (ref 0–40)
Albumin/Globulin Ratio: 1.5 (ref 1.2–2.2)
Albumin: 4.2 g/dL (ref 3.8–4.9)
Alkaline Phosphatase: 105 IU/L (ref 44–121)
BUN/Creatinine Ratio: 17 (ref 9–23)
BUN: 17 mg/dL (ref 6–24)
Bilirubin Total: 0.3 mg/dL (ref 0.0–1.2)
CO2: 23 mmol/L (ref 20–29)
Calcium: 9.5 mg/dL (ref 8.7–10.2)
Chloride: 98 mmol/L (ref 96–106)
Creatinine, Ser: 1.01 mg/dL — ABNORMAL HIGH (ref 0.57–1.00)
Globulin, Total: 2.8 g/dL (ref 1.5–4.5)
Glucose: 84 mg/dL (ref 65–99)
Potassium: 4.3 mmol/L (ref 3.5–5.2)
Sodium: 137 mmol/L (ref 134–144)
Total Protein: 7 g/dL (ref 6.0–8.5)
eGFR: 65 mL/min/{1.73_m2} (ref >59)

## 2020-11-28 ENCOUNTER — Other Ambulatory Visit: Payer: Self-pay | Admitting: Family

## 2020-11-28 DIAGNOSIS — M7661 Achilles tendinitis, right leg: Secondary | ICD-10-CM

## 2020-12-27 ENCOUNTER — Encounter: Payer: Self-pay | Admitting: *Deleted

## 2020-12-27 ENCOUNTER — Ambulatory Visit: Payer: BC Managed Care – PPO | Admitting: Family Medicine

## 2020-12-27 ENCOUNTER — Other Ambulatory Visit: Payer: Self-pay

## 2020-12-27 ENCOUNTER — Encounter (HOSPITAL_COMMUNITY): Payer: Self-pay | Admitting: Hematology & Oncology

## 2020-12-27 ENCOUNTER — Encounter: Payer: Self-pay | Admitting: Family Medicine

## 2020-12-27 ENCOUNTER — Ambulatory Visit (INDEPENDENT_AMBULATORY_CARE_PROVIDER_SITE_OTHER): Payer: BC Managed Care – PPO

## 2020-12-27 VITALS — BP 140/77 | HR 75 | Temp 98.2°F | Ht 62.0 in | Wt 243.2 lb

## 2020-12-27 DIAGNOSIS — R109 Unspecified abdominal pain: Secondary | ICD-10-CM | POA: Diagnosis not present

## 2020-12-27 DIAGNOSIS — R1031 Right lower quadrant pain: Secondary | ICD-10-CM

## 2020-12-27 DIAGNOSIS — R1084 Generalized abdominal pain: Secondary | ICD-10-CM

## 2020-12-27 LAB — URINALYSIS, ROUTINE W REFLEX MICROSCOPIC
Bilirubin, UA: NEGATIVE
Glucose, UA: NEGATIVE
Ketones, UA: NEGATIVE
Nitrite, UA: NEGATIVE
Protein,UA: NEGATIVE
RBC, UA: NEGATIVE
Specific Gravity, UA: 1.01 (ref 1.005–1.030)
Urobilinogen, Ur: 0.2 mg/dL (ref 0.2–1.0)
pH, UA: 6 (ref 5.0–7.5)

## 2020-12-27 LAB — MICROSCOPIC EXAMINATION
RBC, Urine: NONE SEEN /hpf (ref 0–2)
Renal Epithel, UA: NONE SEEN /hpf

## 2020-12-27 MED ORDER — TAMSULOSIN HCL 0.4 MG PO CAPS: 0.4000 mg | ORAL_CAPSULE | Freq: Every day | ORAL | 3 refills | Status: DC

## 2020-12-27 NOTE — Patient Instructions (Signed)
Flank Pain, Adult Flank pain is pain that is located on the side of the body between the upper abdomen and the back. This area is called the flank. The pain may occur over a short period of time (acute), or it may be long-term or recurring (chronic). It may be mild or severe. Flank pain can be caused by many things, including: Muscle soreness or injury. Kidney stones or kidney disease. Stress. A disease of the spine (vertebral disk disease). A lung infection (pneumonia). Fluid around the lungs (pulmonary edema). A skin rash caused by the chickenpox virus (shingles). Tumors that affect the back of the abdomen. Gallbladder disease. Follow these instructions at home:  Drink enough fluid to keep your urine clear or pale yellow. Rest as told by your health care provider. Take over-the-counter and prescription medicines only as told by your health care provider. Keep a journal to track what has caused your flank pain and what has made it feel better. Keep all follow-up visits as told by your health care provider. This is important. Contact a health care provider if: Your pain is not controlled with medicine. You have new symptoms. Your pain gets worse. You have a fever. Your symptoms last longer than 2-3 days. You have trouble urinating or you are urinating very frequently. Get help right away if: You have trouble breathing or you are short of breath. Your abdomen hurts or it is swollen or red. You have nausea or vomiting. You feel faint or you pass out. You have blood in your urine. Summary Flank pain is pain that is located on the side of the body between the upper abdomen and the back. The pain may occur over a short period of time (acute), or it may be long-term or recurring (chronic). It may be mild or severe. Flank pain can be caused by many things. Contact your health care provider if your symptoms get worse or they last longer than 2-3 days. This information is not intended to  replace advice given to you by your health care provider. Make sure you discuss any questions you have with your healthcare provider. Document Revised: 01/11/2020 Document Reviewed: 01/14/2020 Elsevier Patient Education  2022 [employer].

## 2020-12-27 NOTE — Progress Notes (Signed)
Acute Office Visit  Subjective:    Patient ID: Hayley Crawford, female    DOB: 31-Dec-1965, 55 y.o.   MRN: [id number]  Chief Complaint  Patient presents with   Flank Pain    HPI Patient is in today for right sided low back pain x 2 weeks. The pain radiates to her left lower abdomen. She reports that it is a constant ache with intermittent sharper pains. The pain is worse with walking. She has difficulty voiding this morning and also felt nauseous. She denies dysuria, frequency, urgency, fevers, chills, vomiting, blood in urine, vaginal itching or discharge. She reports a normal BM 4 days ago then diarrhea yesterday. She reports a normal BM this morning. She has had a kidney stone before that she passed on her own. She has also been more active than usually with helping her son move.   Past Medical History:  Diagnosis Date   B12 deficiency    COPD (chronic obstructive pulmonary disease) ([place])    Hyperlipidemia    Hypertension    Uterine fibroid     Past Surgical History:  Procedure Laterality Date   ABDOMINAL HYSTERECTOMY     Partial   bladder stretching     CHOLECYSTECTOMY     HEMORROIDECTOMY     TENDON REPAIR     right thumb   tumor removed      Family History  Problem Relation Age of Onset   Diabetes Other    Cancer Mother        lung   Brain cancer Mother        tumor    Social History   Socioeconomic History   Marital status: Single    Spouse name: Not on file   Number of children: Not on file   Years of education: Not on file   Highest education level: Not on file  Occupational History   Not on file  Tobacco Use   Smoking status: Former    Packs/day: 1.50    Types: Cigarettes    Quit date: 01/09/2018    Years since quitting: 2.9   Smokeless tobacco: Never  Vaping Use   Vaping Use: Never used  Substance and Sexual Activity   Alcohol use: Yes   Drug use: No   Sexual activity: Never  Other Topics Concern   Not on file  Social History Narrative   Not  on file   Social Determinants of Health   Financial Resource Strain: Not on file  Food Insecurity: Not on file  Transportation Needs: Not on file  Physical Activity: Not on file  Stress: Not on file  Social Connections: Not on file  Intimate Partner Violence: Not on file    Outpatient Medications Prior to Visit  Medication Sig Dispense Refill   albuterol (VENTOLIN HFA) 108 (90 Base) MCG/ACT inhaler Inhale 2 puffs into the lungs every 6 (six) hours as needed for wheezing or shortness of breath. 1 each 3   busPIRone (BUSPAR) 10 MG tablet Take 1 tablet (10 mg total) by mouth 3 (three) times daily as needed. 90 tablet 1   cyclobenzaprine (FLEXERIL) 10 MG tablet Take 1 tablet (10 mg total) by mouth 3 (three) times daily as needed for muscle spasms. 60 tablet 1   diclofenac (VOLTAREN) 75 MG EC tablet Take 1 tablet by mouth twice daily 60 tablet 0   hydrochlorothiazide (HYDRODIURIL) 25 MG tablet Take 1 tablet (25 mg total) by mouth daily. 90 tablet 3   losartan (COZAAR)  100 MG tablet Take 1 tablet (100 mg total) by mouth daily. 90 tablet 3   omeprazole (PRILOSEC) 20 MG capsule Take 1 capsule (20 mg total) by mouth daily. 90 capsule 3   promethazine (PHENERGAN) 25 MG tablet TAKE 1/2 TO 1 TABLETS BY MOUTH EVERY 8 HOURS AS NEEDED FOR NAUSE OR VOMITING 20 tablet 2   Vitamin D, Ergocalciferol, (DRISDOL) 1.25 MG (50000 UNIT) CAPS capsule Take 1 capsule (50,000 Units total) by mouth every 7 (seven) days. 12 capsule 3   atorvastatin (LIPITOR) 20 MG tablet Take 1 tablet (20 mg total) by mouth daily. (Patient not taking: Reported on 12/27/2020) 90 tablet 3   nitroGLYCERIN (NITROSTAT) 0.4 MG SL tablet Place 1 tablet (0.4 mg total) under the tongue every 5 (five) minutes as needed for chest pain. (Patient not taking: No sig reported) 25 tablet 3   No facility-administered medications prior to visit.    Allergies  Allergen Reactions   Sulfa Antibiotics Swelling    "think it caused my throat to swell"     Review of Systems As per HPI.    Objective:    Physical Exam Vitals and nursing note reviewed.  Constitutional:      General: She is not in acute distress.    Appearance: Normal appearance. She is not ill-appearing, toxic-appearing or diaphoretic.  Cardiovascular:     Rate and Rhythm: Normal rate and regular rhythm.     Heart sounds: Normal heart sounds. No murmur heard. Pulmonary:     Effort: Pulmonary effort is normal. No respiratory distress.     Breath sounds: Normal breath sounds. No wheezing, rhonchi or rales.  Chest:     Chest wall: No tenderness.  Abdominal:     General: Bowel sounds are normal. There is no distension.     Palpations: Abdomen is soft.     Tenderness: There is no abdominal tenderness. There is no right CVA tenderness, left CVA tenderness, guarding or rebound. Negative signs include Murphy's sign and McBurney's sign.  Musculoskeletal:     Right lower leg: No edema.     Left lower leg: No edema.  Skin:    General: Skin is warm and dry.  Neurological:     General: No focal deficit present.     Mental Status: She is alert and oriented to person, place, and time.  Psychiatric:        Mood and Affect: Mood normal.        Behavior: Behavior normal.   Urine dipstick shows positive for leukocytes.  Micro exam: 0-5 WBC's per HPF and few + bacteria.   BP 140/77   Pulse 75   Temp 98.2 F (36.8 C) (Temporal)   Ht '5\' 2"'  (1.575 m)   Wt 243 lb 4 oz (110.3 kg)   LMP 05/31/2014   BMI 44.49 kg/m  Wt Readings from Last 3 Encounters:  12/27/20 243 lb 4 oz (110.3 kg)  10/09/20 242 lb 9.6 oz (110 kg)  07/21/20 233 lb (105.7 kg)    Health Maintenance Due  Topic Date Due   COVID-19 Vaccine (4 - Booster for Pfizer series) 09/30/2020   MAMMOGRAM  12/16/2020    There are no preventive care reminders to display for this patient.   Lab Results  Component Value Date   TSH 2.910 01/04/2020   Lab Results  Component Value Date   WBC 9.0 10/09/2020   HGB  13.1 10/09/2020   HCT 38.4 10/09/2020   MCV 94 10/09/2020   PLT  291 10/09/2020   Lab Results  Component Value Date   NA 137 10/09/2020   K 4.3 10/09/2020   CO2 23 10/09/2020   GLUCOSE 84 10/09/2020   BUN 17 10/09/2020   CREATININE 1.01 (H) 10/09/2020   BILITOT 0.3 10/09/2020   ALKPHOS 105 10/09/2020   AST 16 10/09/2020   ALT 19 10/09/2020   PROT 7.0 10/09/2020   ALBUMIN 4.2 10/09/2020   CALCIUM 9.5 10/09/2020   ANIONGAP 9 04/01/2017   EGFR 65 10/09/2020   Lab Results  Component Value Date   CHOL 244 (H) 04/07/2020   Lab Results  Component Value Date   HDL 64 04/07/2020   Lab Results  Component Value Date   LDLCALC 136 (H) 04/07/2020   Lab Results  Component Value Date   TRIG 247 (H) 04/07/2020   Lab Results  Component Value Date   CHOLHDL 3.8 04/07/2020   No results found for: HGBA1C     Assessment & Plan:   Gretel was seen today for flank pain.  Diagnoses and all orders for this visit:  Flank pain Right lower quadrant abdominal pain ? Kidney stone. Benign abdominal exam today. UA + WBC, few bacteria. No nitrates or blood. Culture is pending. KUB negative today. Discussed CT for renal stone- patient declined. Will try flomax for possible kidney stone. Discussed return precautions.  -     Urinalysis, Routine w reflex microscopic -     DG Abd 1 View; Future -     tamsulosin (FLOMAX) 0.4 MG CAPS capsule; Take 1 capsule (0.4 mg total) by mouth daily. -     Urine Culture -     Microscopic Examination  Return to office for new or worsening symptoms, or if symptoms persist.   The patient indicates understanding of these issues and agrees with the plan.  Gwenlyn Perking, FNP

## 2020-12-29 LAB — URINE CULTURE

## 2021-01-04 ENCOUNTER — Other Ambulatory Visit: Payer: Self-pay | Admitting: Family

## 2021-01-04 DIAGNOSIS — I10 Essential (primary) hypertension: Secondary | ICD-10-CM

## 2021-01-24 ENCOUNTER — Other Ambulatory Visit (HOSPITAL_COMMUNITY): Payer: Self-pay | Admitting: Family

## 2021-01-24 DIAGNOSIS — Z1231 Encounter for screening mammogram for malignant neoplasm of breast: Secondary | ICD-10-CM

## 2021-01-29 ENCOUNTER — Other Ambulatory Visit: Payer: Self-pay

## 2021-01-29 ENCOUNTER — Ambulatory Visit (HOSPITAL_COMMUNITY)
Admission: RE | Admit: 2021-01-29 | Discharge: 2021-01-29 | Disposition: A | Discharge status: Home / Self Care | Payer: BC Managed Care – PPO | Source: Ambulatory Visit | Attending: Family | Admitting: Family

## 2021-01-29 DIAGNOSIS — Z1231 Encounter for screening mammogram for malignant neoplasm of breast: Secondary | ICD-10-CM | POA: Diagnosis not present

## 2021-03-06 ENCOUNTER — Other Ambulatory Visit: Payer: Self-pay | Admitting: Family

## 2021-03-06 DIAGNOSIS — M7661 Achilles tendinitis, right leg: Secondary | ICD-10-CM

## 2021-03-06 DIAGNOSIS — I10 Essential (primary) hypertension: Secondary | ICD-10-CM

## 2021-03-06 DIAGNOSIS — K219 Gastro-esophageal reflux disease without esophagitis: Secondary | ICD-10-CM

## 2021-04-10 ENCOUNTER — Ambulatory Visit: Payer: BC Managed Care – PPO | Admitting: Family

## 2021-04-10 ENCOUNTER — Encounter: Payer: Self-pay | Admitting: Family

## 2021-04-10 VITALS — BP 124/72 | HR 83 | Temp 98.0°F | Ht 62.0 in | Wt 256.0 lb

## 2021-04-10 DIAGNOSIS — Z6841 Body Mass Index (BMI) 40.0 and over, adult: Secondary | ICD-10-CM | POA: Diagnosis not present

## 2021-04-10 DIAGNOSIS — I10 Essential (primary) hypertension: Secondary | ICD-10-CM | POA: Diagnosis not present

## 2021-04-10 DIAGNOSIS — Z0000 Encounter for general adult medical examination without abnormal findings: Secondary | ICD-10-CM

## 2021-04-10 DIAGNOSIS — Z0001 Encounter for general adult medical examination with abnormal findings: Secondary | ICD-10-CM | POA: Diagnosis not present

## 2021-04-10 DIAGNOSIS — F411 Generalized anxiety disorder: Secondary | ICD-10-CM

## 2021-04-10 DIAGNOSIS — E785 Hyperlipidemia, unspecified: Secondary | ICD-10-CM

## 2021-04-10 DIAGNOSIS — K219 Gastro-esophageal reflux disease without esophagitis: Secondary | ICD-10-CM

## 2021-04-10 DIAGNOSIS — D509 Iron deficiency anemia, unspecified: Secondary | ICD-10-CM

## 2021-04-10 DIAGNOSIS — F39 Unspecified mood [affective] disorder: Secondary | ICD-10-CM

## 2021-04-10 DIAGNOSIS — J449 Chronic obstructive pulmonary disease, unspecified: Secondary | ICD-10-CM | POA: Diagnosis not present

## 2021-04-10 DIAGNOSIS — R6889 Other general symptoms and signs: Secondary | ICD-10-CM | POA: Diagnosis not present

## 2021-04-10 MED ORDER — PROMETHAZINE HCL 25 MG PO TABS: ORAL_TABLET | ORAL | 2 refills | Status: DC

## 2021-04-10 MED ORDER — LOSARTAN POTASSIUM 100 MG PO TABS: 100.0000 mg | ORAL_TABLET | Freq: Every day | ORAL | 3 refills | Status: DC

## 2021-04-10 NOTE — Patient Instructions (Signed)

## 2021-04-10 NOTE — Progress Notes (Signed)
Subjective:    Patient ID: Hayley Crawford, female    DOB: 1965-11-28, 55 y.o.   MRN: [id number]  Chief Complaint  Patient presents with   Annual Exam   PT presents to the office today for CPE. She has COPD and reports mild SOB with exertion. She quit smoking approx  2018. Hypertension This is a chronic problem. The current episode started more than 1 year ago. The problem has been resolved since onset. The problem is controlled. Associated symptoms include anxiety and malaise/fatigue. Pertinent negatives include no peripheral edema or shortness of breath. Risk factors for coronary artery disease include dyslipidemia, obesity and sedentary lifestyle. The current treatment provides moderate improvement.  Gastroesophageal Reflux She complains of belching and heartburn. This is a chronic problem. The current episode started more than 1 year ago. The problem occurs occasionally. The symptoms are aggravated by certain foods. Risk factors include obesity. She has tried a PPI for the symptoms.  Anemia Presents for follow-up visit. Symptoms include malaise/fatigue. There has been no confusion or leg swelling.  Hyperlipidemia This is a chronic problem. The current episode started more than 1 year ago. Exacerbating diseases include obesity. Pertinent negatives include no shortness of breath. Current antihyperlipidemic treatment includes diet change. The current treatment provides moderate improvement of lipids. Risk factors for coronary artery disease include hypertension, a sedentary lifestyle, post-menopausal and dyslipidemia.  Anxiety Presents for follow-up visit. Symptoms include excessive worry, irritability and nervous/anxious behavior. Patient reports no confusion or shortness of breath. Symptoms occur occasionally. The severity of symptoms is moderate.   Her past medical history is significant for anemia.     Review of Systems  Constitutional:  Positive for irritability and malaise/fatigue.   Respiratory:  Negative for shortness of breath.   Gastrointestinal:  Positive for heartburn.  Psychiatric/Behavioral:  Negative for confusion. The patient is nervous/anxious.   All other systems reviewed and are negative.  Family History  Problem Relation Age of Onset   Diabetes Other    Cancer Mother        lung   Brain cancer Mother        tumor   Social History   Socioeconomic History   Marital status: Single    Spouse name: Not on file   Number of children: Not on file   Years of education: Not on file   Highest education level: Not on file  Occupational History   Not on file  Tobacco Use   Smoking status: Former    Packs/day: 1.50    Types: Cigarettes    Quit date: 01/09/2018    Years since quitting: 3.2   Smokeless tobacco: Never  Vaping Use   Vaping Use: Never used  Substance and Sexual Activity   Alcohol use: Yes   Drug use: No   Sexual activity: Never  Other Topics Concern   Not on file  Social History Narrative   Not on file   Social Determinants of Health   Financial Resource Strain: Not on file  Food Insecurity: Not on file  Transportation Needs: Not on file  Physical Activity: Not on file  Stress: Not on file  Social Connections: Not on file      Objective:   Physical Exam Vitals reviewed.  Constitutional:      General: She is not in acute distress.    Appearance: She is well-developed. She is obese.  HENT:     Head: Normocephalic and atraumatic.     Right Ear: Tympanic membrane normal.  Left Ear: Tympanic membrane normal.  Eyes:     Pupils: Pupils are equal, round, and reactive to light.  Neck:     Thyroid: No thyromegaly.  Cardiovascular:     Rate and Rhythm: Normal rate and regular rhythm.     Heart sounds: Normal heart sounds. No murmur heard. Pulmonary:     Effort: Pulmonary effort is normal. No respiratory distress.     Breath sounds: Normal breath sounds. No wheezing.  Abdominal:     General: Bowel sounds are normal.  There is no distension.     Palpations: Abdomen is soft.     Tenderness: There is no abdominal tenderness.  Musculoskeletal:        General: No tenderness. Normal range of motion.     Cervical back: Normal range of motion and neck supple.  Skin:    General: Skin is warm and dry.  Neurological:     Mental Status: She is alert and oriented to person, place, and time.     Cranial Nerves: No cranial nerve deficit.     Deep Tendon Reflexes: Reflexes are normal and symmetric.  Psychiatric:        Behavior: Behavior normal.        Thought Content: Thought content normal.        Judgment: Judgment normal.      BP 124/72   Pulse 83   Temp 98 F (36.7 C) (Temporal)   Ht _0  (1.575 m)   Wt 256 lb (116.1 kg)   LMP 05/31/2014   BMI 46.82 kg/m      Assessment & Plan:  LETTI TOWELL comes in today with chief complaint of Annual Exam   Diagnosis and orders addressed:  1. Essential hypertension - losartan (COZAAR) 100 MG tablet; Take 1 tablet (100 mg total) by mouth daily.  Dispense: 90 tablet; Refill: 3 - CMP14+EGFR - CBC with Differential/Platelet  2. Primary hypertension - CMP14+EGFR - CBC with Differential/Platelet  3. Chronic obstructive pulmonary disease, unspecified COPD type ([place])  - CMP14+EGFR - CBC with Differential/Platelet  4. Gastroesophageal reflux disease, unspecified whether esophagitis present - CMP14+EGFR - CBC with Differential/Platelet  5. GAD (generalized anxiety disorder) - CMP14+EGFR - CBC with Differential/Platelet  6. Mood disorder ([facility])  - CMP14+EGFR - CBC with Differential/Platelet  7. Hyperlipidemia, unspecified hyperlipidemia type - CMP14+EGFR - CBC with Differential/Platelet  8. Morbid obesity with BMI of 40.0-44.9, adult (HCC) - CMP14+EGFR - CBC with Differential/Platelet  9. Iron deficiency anemia, unspecified iron deficiency anemia type - CMP14+EGFR - CBC with Differential/Platelet  10. Annual physical exam - CMP14+EGFR -  CBC with Differential/Platelet - Lipid panel - TSH - Vitamin B12   Labs pending Health Maintenance reviewed Diet and exercise encouraged  Follow up plan: 6 months   [name], FNP

## 2021-04-11 LAB — LIPID PANEL
Chol/HDL Ratio: 4.5 ratio — ABNORMAL HIGH (ref 0.0–4.4)
Cholesterol, Total: 220 mg/dL — ABNORMAL HIGH (ref 100–199)
HDL: 49 mg/dL (ref >39)
LDL Chol Calc (NIH): 106 mg/dL — ABNORMAL HIGH (ref 0–99)
Triglycerides: 383 mg/dL — ABNORMAL HIGH (ref 0–149)
VLDL Cholesterol Cal: 65 mg/dL — ABNORMAL HIGH (ref 5–40)

## 2021-04-11 LAB — CBC WITH DIFFERENTIAL/PLATELET
Basophils Absolute: 0.1 10*3/uL (ref 0.0–0.2)
Basos: 1 %
EOS (ABSOLUTE): 0.2 10*3/uL (ref 0.0–0.4)
Eos: 3 %
Hematocrit: 36.9 % (ref 34.0–46.6)
Hemoglobin: 12.3 g/dL (ref 11.1–15.9)
Immature Grans (Abs): 0 10*3/uL (ref 0.0–0.1)
Immature Granulocytes: 0 %
Lymphocytes Absolute: 1.9 10*3/uL (ref 0.7–3.1)
Lymphs: 25 %
MCH: 31.9 pg (ref 26.6–33.0)
MCHC: 33.3 g/dL (ref 31.5–35.7)
MCV: 96 fL (ref 79–97)
Monocytes Absolute: 0.5 10*3/uL (ref 0.1–0.9)
Monocytes: 6 %
Neutrophils Absolute: 5 10*3/uL (ref 1.4–7.0)
Neutrophils: 65 %
Platelets: 319 10*3/uL (ref 150–450)
RBC: 3.85 x10E6/uL (ref 3.77–5.28)
RDW: 12.7 % (ref 11.7–15.4)
WBC: 7.7 10*3/uL (ref 3.4–10.8)

## 2021-04-11 LAB — CMP14+EGFR
ALT: 31 IU/L (ref 0–32)
AST: 17 IU/L (ref 0–40)
Albumin/Globulin Ratio: 1.4 (ref 1.2–2.2)
Albumin: 3.9 g/dL (ref 3.8–4.9)
Alkaline Phosphatase: 108 IU/L (ref 44–121)
BUN/Creatinine Ratio: 19 (ref 9–23)
BUN: 13 mg/dL (ref 6–24)
Bilirubin Total: <0.2 mg/dL (ref 0.0–1.2)
CO2: 24 mmol/L (ref 20–29)
Calcium: 9.5 mg/dL (ref 8.7–10.2)
Chloride: 105 mmol/L (ref 96–106)
Creatinine, Ser: 0.7 mg/dL (ref 0.57–1.00)
Globulin, Total: 2.7 g/dL (ref 1.5–4.5)
Glucose: 106 mg/dL — ABNORMAL HIGH (ref 70–99)
Potassium: 4 mmol/L (ref 3.5–5.2)
Sodium: 145 mmol/L — ABNORMAL HIGH (ref 134–144)
Total Protein: 6.6 g/dL (ref 6.0–8.5)
eGFR: 101 mL/min/{1.73_m2} (ref >59)

## 2021-04-11 LAB — VITAMIN B12: Vitamin B-12: 208 pg/mL — ABNORMAL LOW (ref 232–1245)

## 2021-04-11 LAB — TSH: TSH: 2.01 u[IU]/mL (ref 0.450–4.500)

## 2021-04-12 ENCOUNTER — Ambulatory Visit: Payer: BC Managed Care – PPO | Admitting: Family

## 2021-05-08 ENCOUNTER — Other Ambulatory Visit: Payer: Self-pay | Admitting: Family

## 2021-05-08 DIAGNOSIS — M7661 Achilles tendinitis, right leg: Secondary | ICD-10-CM

## 2021-05-27 IMAGING — MG DIGITAL SCREENING BILAT W/ TOMO W/ CAD
6 of 10 series · 6 of 30 positions shown · non-contrast
Comparison: Previous exam(s).

CLINICAL DATA: Screening.

EXAM:
DIGITAL SCREENING BILATERAL MAMMOGRAM WITH TOMO AND CAD

[L MLO synth-2D]
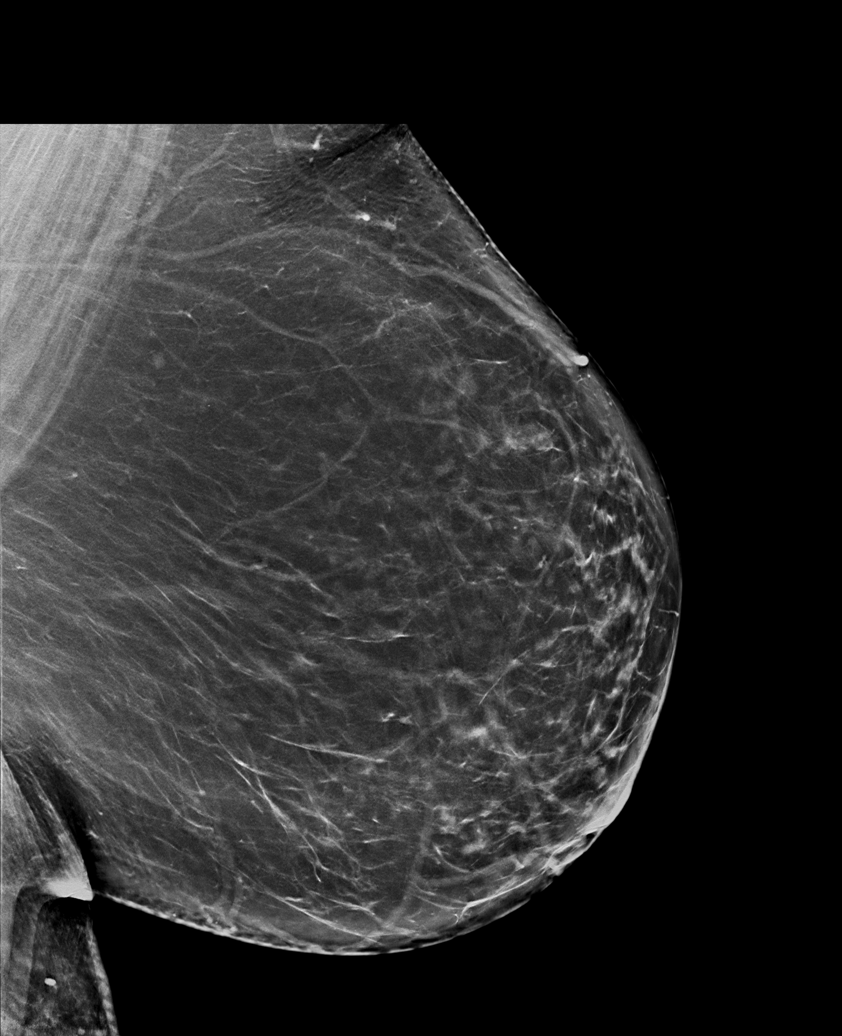

[R CC synth-2D (1 of 2)]
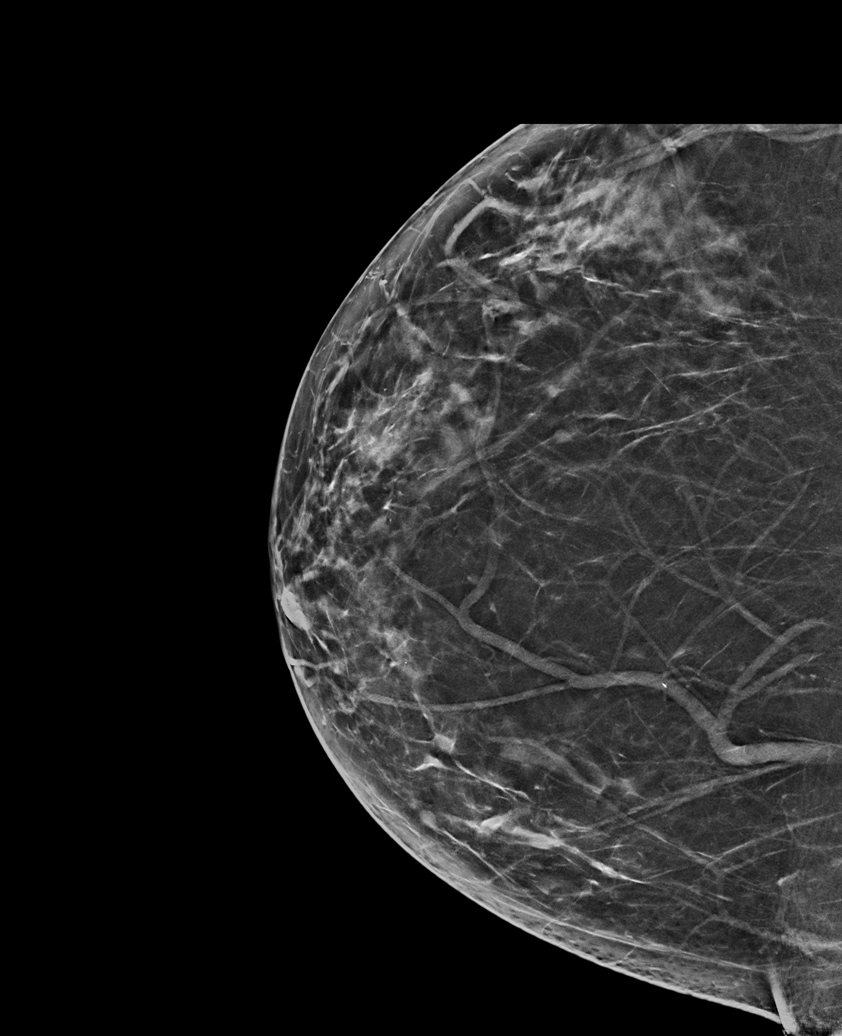

[R CC synth-2D (2 of 2)]
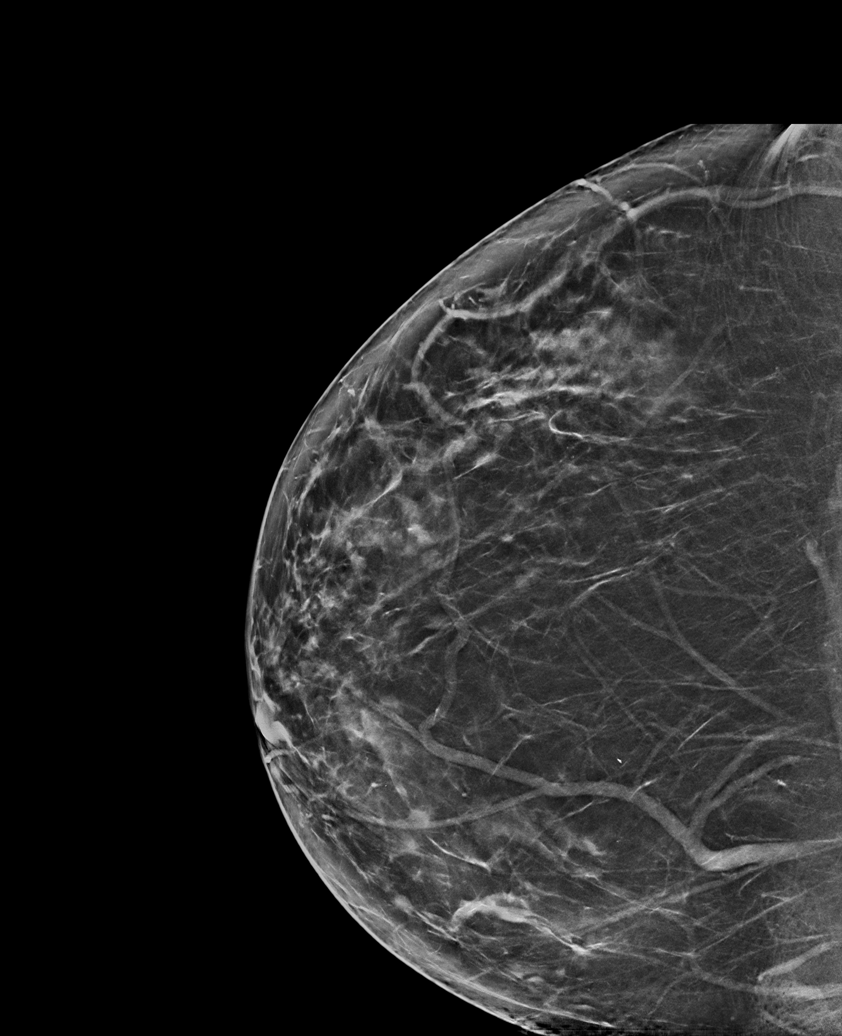

[R MLO synth-2D]
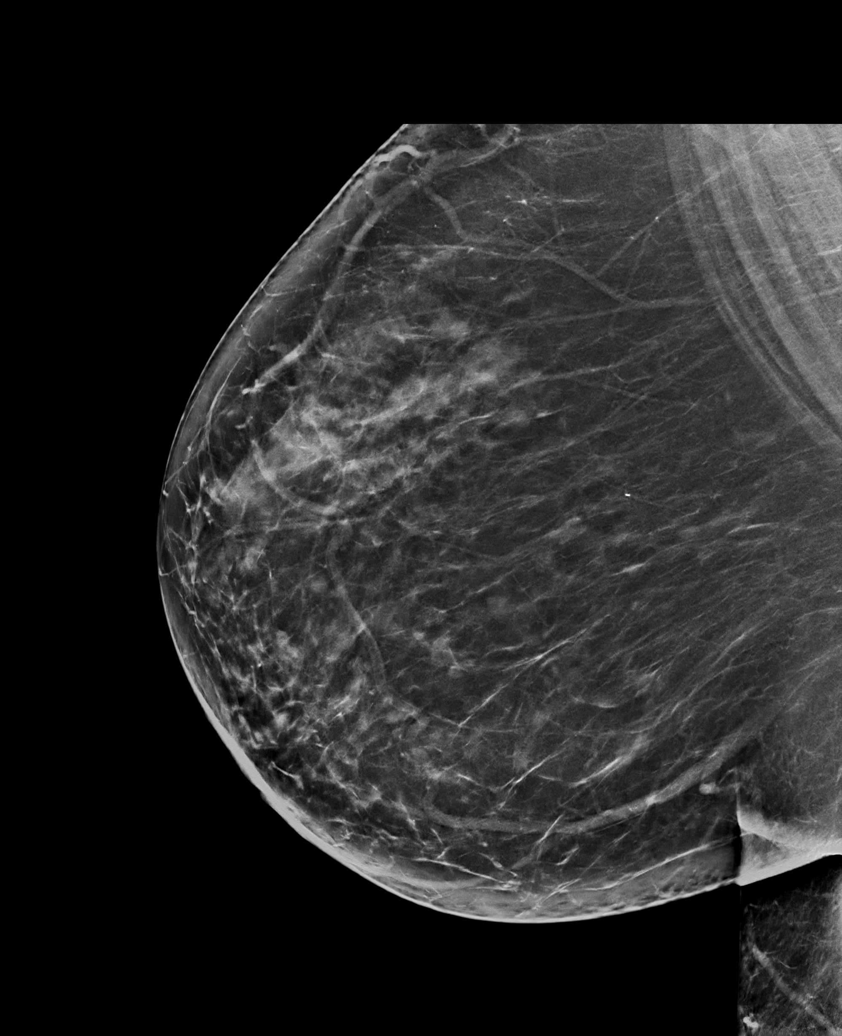

[L CC synth-2D]
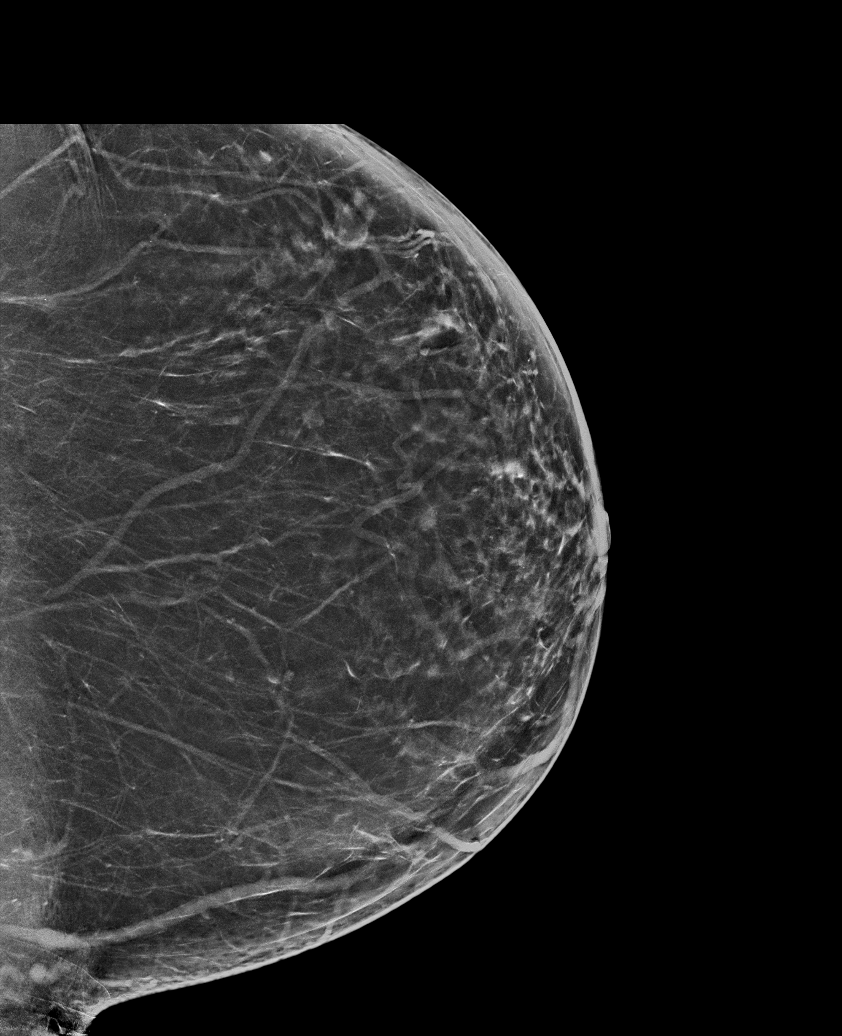

[R CC tomo · tomo slice 43/84.0]
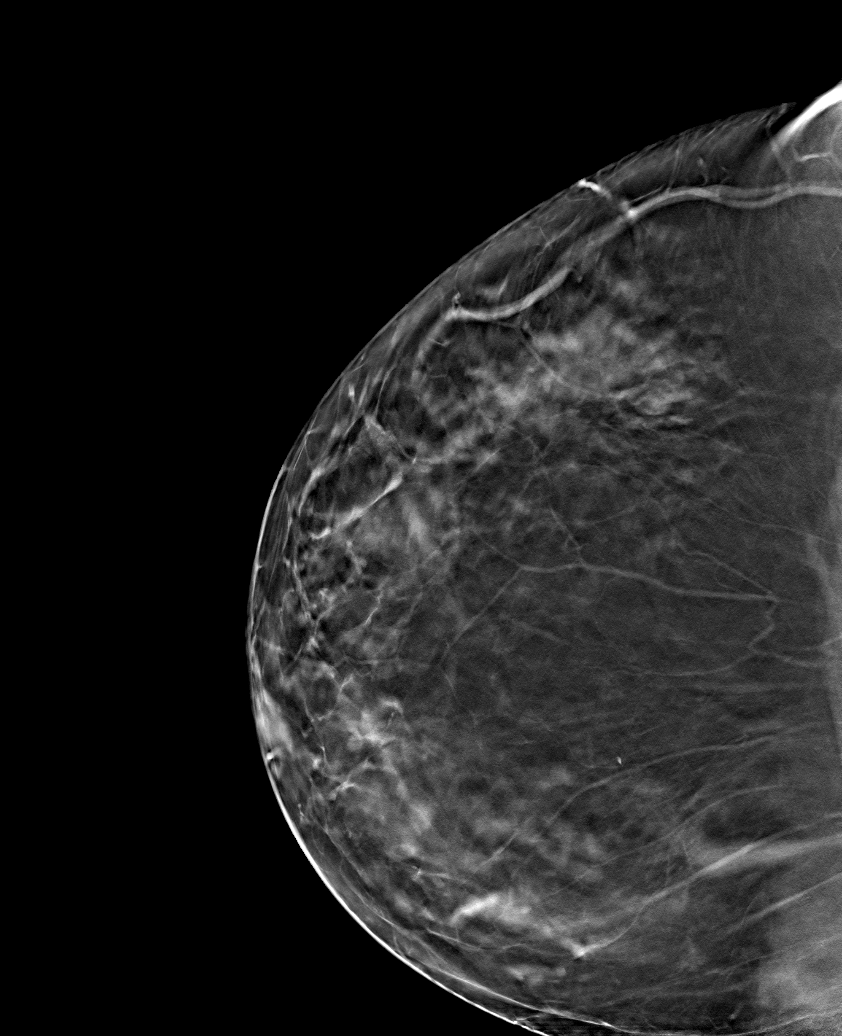

[6 of 30 positions shown; findings below may reference images not displayed]

ACR Breast Density Category b: There are scattered areas of
fibroglandular density.
FINDINGS: There are no findings suspicious for malignancy. Images were
processed with CAD.
IMPRESSION: No mammographic evidence of malignancy. A result letter of this
screening mammogram will be mailed directly to the patient.

RECOMMENDATION:
Screening mammogram in one year. (Code:[id number]-775)

BI-RADS CATEGORY  1: Negative.

## 2021-06-01 ENCOUNTER — Other Ambulatory Visit: Payer: Self-pay | Admitting: Family

## 2021-06-01 DIAGNOSIS — I10 Essential (primary) hypertension: Secondary | ICD-10-CM

## 2021-06-01 DIAGNOSIS — K219 Gastro-esophageal reflux disease without esophagitis: Secondary | ICD-10-CM

## 2021-06-28 ENCOUNTER — Encounter: Payer: Self-pay | Admitting: Family Medicine

## 2021-06-28 ENCOUNTER — Ambulatory Visit: Payer: BC Managed Care – PPO | Admitting: Family Medicine

## 2021-06-28 VITALS — BP 134/84 | HR 94 | Temp 97.2°F | Ht 62.0 in | Wt 249.0 lb

## 2021-06-28 DIAGNOSIS — A084 Viral intestinal infection, unspecified: Secondary | ICD-10-CM

## 2021-06-28 NOTE — Progress Notes (Signed)
Subjective:  Patient ID: Hayley Crawford, female    DOB: August 28, 1965, 56 y.o.   MRN: [id number]  Patient Care Team: Sharion Balloon, FNP as PCP - General (Nurse Practitioner)   Chief Complaint:  Abdominal Pain   HPI: Hayley Crawford is a 56 y.o. female presenting on 06/28/2021 for Abdominal Pain   Pt presents with complaints of nausea, diarrhea, and abdominal cramping since Sunday night. She treated her symptoms with Phenergan she had on hand. She had a Telehealth visit on Monday when she was Rx'd Zofran and Bentyl. She states improvement with Bentyl and has not utilized Zofran. She has utilized Imodium a few times. She states the problem is now resolving expect for a minor gnawing abdominal pain. She denies blood in the stool. She is able to keep down fluids and food.   Abdominal Pain Associated symptoms include nausea. Pertinent negatives include no constipation, diarrhea, dysuria, fever or vomiting.  There are no diagnoses linked to this encounter.    Relevant past medical, surgical, family, and social history reviewed and updated as indicated.  Allergies and medications reviewed and updated. Data reviewed: Chart in Epic.   Past Medical History:  Diagnosis Date   B12 deficiency    COPD (chronic obstructive pulmonary disease) ([place])    Hyperlipidemia    Hypertension    Uterine fibroid     Past Surgical History:  Procedure Laterality Date   ABDOMINAL HYSTERECTOMY     Partial   bladder stretching     CHOLECYSTECTOMY     HEMORROIDECTOMY     TENDON REPAIR     right thumb   tumor removed      Social History   Socioeconomic History   Marital status: Single    Spouse name: Not on file   Number of children: Not on file   Years of education: Not on file   Highest education level: Not on file  Occupational History   Not on file  Tobacco Use   Smoking status: Former    Packs/day: 1.50    Types: Cigarettes    Quit date: 01/09/2018    Years since quitting: 3.4    Smokeless tobacco: Never  Vaping Use   Vaping Use: Never used  Substance and Sexual Activity   Alcohol use: Yes   Drug use: No   Sexual activity: Never  Other Topics Concern   Not on file  Social History Narrative   Not on file   Social Determinants of Health   Financial Resource Strain: Not on file  Food Insecurity: Not on file  Transportation Needs: Not on file  Physical Activity: Not on file  Stress: Not on file  Social Connections: Not on file  Intimate Partner Violence: Not on file    Outpatient Encounter Medications as of 06/28/2021  Medication Sig   albuterol (VENTOLIN HFA) 108 (90 Base) MCG/ACT inhaler Inhale 2 puffs into the lungs every 6 (six) hours as needed for wheezing or shortness of breath.   atorvastatin (LIPITOR) 20 MG tablet Take 1 tablet (20 mg total) by mouth daily. (Patient not taking: Reported on 12/27/2020)   busPIRone (BUSPAR) 10 MG tablet Take 1 tablet (10 mg total) by mouth 3 (three) times daily as needed.   cyclobenzaprine (FLEXERIL) 10 MG tablet Take 1 tablet (10 mg total) by mouth 3 (three) times daily as needed for muscle spasms.   diclofenac (VOLTAREN) 75 MG EC tablet Take 1 tablet by mouth twice daily   dicyclomine (BENTYL)  10 MG capsule Take 10-20 mg by mouth every 8 (eight) hours as needed.   hydrochlorothiazide (HYDRODIURIL) 25 MG tablet Take 1 tablet by mouth once daily   losartan (COZAAR) 100 MG tablet Take 1 tablet (100 mg total) by mouth daily.   nitroGLYCERIN (NITROSTAT) 0.4 MG SL tablet Place 1 tablet (0.4 mg total) under the tongue every 5 (five) minutes as needed for chest pain. (Patient not taking: No sig reported)   omeprazole (PRILOSEC) 20 MG capsule Take 1 capsule by mouth once daily   promethazine (PHENERGAN) 25 MG tablet TAKE 1/2 TO 1 TABLETS BY MOUTH EVERY 8 HOURS AS NEEDED FOR NAUSE OR VOMITING   Vitamin D, Ergocalciferol, (DRISDOL) 1.25 MG (50000 UNIT) CAPS capsule Take 1 capsule (50,000 Units total) by mouth every 7 (seven) days.    No facility-administered encounter medications on file as of 06/28/2021.    Allergies  Allergen Reactions   Sulfa Antibiotics Swelling    "think it caused my throat to swell"    Review of Systems  Constitutional:  Negative for activity change, appetite change, chills, diaphoresis, fatigue, fever and unexpected weight change.  Respiratory:  Negative for cough and shortness of breath.   Cardiovascular:  Negative for chest pain, palpitations and leg swelling.  Gastrointestinal:  Positive for nausea. Negative for abdominal distention, abdominal pain, anal bleeding, blood in stool, constipation, diarrhea, rectal pain and vomiting.  Genitourinary:  Negative for decreased urine volume, difficulty urinating, dysuria and flank pain.  Skin:  Negative for color change.  Neurological:  Negative for weakness.  Psychiatric/Behavioral:  Negative for confusion.   All other systems reviewed and are negative.      Objective:  BP 134/84    Pulse 94    Temp (!) 97.2 F (36.2 C) (Temporal)    Ht '5\' 2"'  (1.575 m)    Wt 112.9 kg    LMP 05/31/2014    SpO2 96%    BMI 45.54 kg/m    Wt Readings from Last 3 Encounters:  06/28/21 112.9 kg  04/10/21 116.1 kg  12/27/20 110.3 kg    Physical Exam Vitals and nursing note reviewed.  Constitutional:      Appearance: Normal appearance. She is well-developed. She is obese.  HENT:     Head: Normocephalic and atraumatic.     Mouth/Throat:     Mouth: Mucous membranes are moist.  Eyes:     Pupils: Pupils are equal, round, and reactive to light.  Cardiovascular:     Rate and Rhythm: Normal rate and regular rhythm.     Heart sounds: Normal heart sounds.  Pulmonary:     Effort: Pulmonary effort is normal.     Breath sounds: Normal breath sounds.  Abdominal:     General: Abdomen is protuberant. Bowel sounds are normal.     Palpations: Abdomen is soft.     Tenderness: There is no abdominal tenderness.  Musculoskeletal:     Cervical back: Normal range of  motion.  Skin:    General: Skin is warm and dry.     Capillary Refill: Capillary refill takes less than 2 seconds.  Neurological:     General: No focal deficit present.     Mental Status: She is alert and oriented to person, place, and time. Mental status is at baseline.  Psychiatric:        Mood and Affect: Mood normal.        Behavior: Behavior normal.        Thought Content: Thought content  normal.        Judgment: Judgment normal.    Results for orders placed or performed in visit on 04/10/21  CMP14+EGFR  Result Value Ref Range   Glucose 106 (H) 70 - 99 mg/dL   BUN 13 6 - 24 mg/dL   Creatinine, Ser 0.70 0.57 - 1.00 mg/dL   eGFR 101 >59 mL/min/1.73   BUN/Creatinine Ratio 19 9 - 23   Sodium 145 (H) 134 - 144 mmol/L   Potassium 4.0 3.5 - 5.2 mmol/L   Chloride 105 96 - 106 mmol/L   CO2 24 20 - 29 mmol/L   Calcium 9.5 8.7 - 10.2 mg/dL   Total Protein 6.6 6.0 - 8.5 g/dL   Albumin 3.9 3.8 - 4.9 g/dL   Globulin, Total 2.7 1.5 - 4.5 g/dL   Albumin/Globulin Ratio 1.4 1.2 - 2.2   Bilirubin Total <0.2 0.0 - 1.2 mg/dL   Alkaline Phosphatase 108 44 - 121 IU/L   AST 17 0 - 40 IU/L   ALT 31 0 - 32 IU/L  CBC with Differential/Platelet  Result Value Ref Range   WBC 7.7 3.4 - 10.8 x10E3/uL   RBC 3.85 3.77 - 5.28 x10E6/uL   Hemoglobin 12.3 11.1 - 15.9 g/dL   Hematocrit 36.9 34.0 - 46.6 %   MCV 96 79 - 97 fL   MCH 31.9 26.6 - 33.0 pg   MCHC 33.3 31.5 - 35.7 g/dL   RDW 12.7 11.7 - 15.4 %   Platelets 319 150 - 450 x10E3/uL   Neutrophils 65 Not Estab. %   Lymphs 25 Not Estab. %   Monocytes 6 Not Estab. %   Eos 3 Not Estab. %   Basos 1 Not Estab. %   Neutrophils Absolute 5.0 1.4 - 7.0 x10E3/uL   Lymphocytes Absolute 1.9 0.7 - 3.1 x10E3/uL   Monocytes Absolute 0.5 0.1 - 0.9 x10E3/uL   EOS (ABSOLUTE) 0.2 0.0 - 0.4 x10E3/uL   Basophils Absolute 0.1 0.0 - 0.2 x10E3/uL   Immature Granulocytes 0 Not Estab. %   Immature Grans (Abs) 0.0 0.0 - 0.1 x10E3/uL  Lipid panel  Result Value Ref  Range   Cholesterol, Total 220 (H) 100 - 199 mg/dL   Triglycerides 383 (H) 0 - 149 mg/dL   HDL 49 >39 mg/dL   VLDL Cholesterol Cal 65 (H) 5 - 40 mg/dL   LDL Chol Calc (NIH) 106 (H) 0 - 99 mg/dL   Chol/HDL Ratio 4.5 (H) 0.0 - 4.4 ratio  TSH  Result Value Ref Range   TSH 2.010 0.450 - 4.500 uIU/mL  Vitamin B12  Result Value Ref Range   Vitamin B-12 208 (L) 232 - 1,245 pg/mL       Pertinent labs & imaging results that were available during my care of the patient were reviewed by me and considered in my medical decision making.  Assessment & Plan:  Hayley Crawford was seen today for abdominal pain.  Diagnoses and all orders for this visit:  Viral gastroenteritis -Discussed viral course with patient. Educated to continue current Bentyl and utilize Zofran if needed. Discussed maintaining adequate hydration. Patient understanding to return if symptoms fail to improve or worsen. BRAT diet discussed in detail.    Continue all other maintenance medications.  Follow up plan: Return if symptoms worsen or fail to improve.   Continue healthy lifestyle choices, including diet (rich in fruits, vegetables, and lean proteins, and low in salt and simple carbohydrates) and exercise (at least 30 minutes of moderate physical activity  daily).  Educational handout given for viral gastroenteritis.   The above assessment and management plan was discussed with the patient. The patient verbalized understanding of and has agreed to the management plan. Patient is aware to call the clinic if they develop any new symptoms or if symptoms persist or worsen. Patient is aware when to return to the clinic for a follow-up visit. Patient educated on when it is appropriate to go to the emergency department.   Addison Aric Jost, NP-S  I personally was present during the history, physical exam, and medical decision-making activities of this visit and have verified that the services and findings are accurately documented in the  nurse practitioner student's note.  Monia Pouch, FNP-C [facility] [address] [place], [place] [address] [phone]

## 2021-07-06 ENCOUNTER — Other Ambulatory Visit: Payer: Self-pay | Admitting: Family

## 2021-07-06 DIAGNOSIS — M7661 Achilles tendinitis, right leg: Secondary | ICD-10-CM

## 2021-08-07 ENCOUNTER — Other Ambulatory Visit: Payer: Self-pay | Admitting: Family

## 2021-08-07 DIAGNOSIS — M1711 Unilateral primary osteoarthritis, right knee: Secondary | ICD-10-CM

## 2021-08-09 DIAGNOSIS — M7731 Calcaneal spur, right foot: Secondary | ICD-10-CM | POA: Diagnosis not present

## 2021-08-09 DIAGNOSIS — M79671 Pain in right foot: Secondary | ICD-10-CM | POA: Diagnosis not present

## 2021-08-09 DIAGNOSIS — M722 Plantar fascial fibromatosis: Secondary | ICD-10-CM | POA: Diagnosis not present

## 2021-08-29 ENCOUNTER — Other Ambulatory Visit: Payer: Self-pay | Admitting: Family

## 2021-08-29 DIAGNOSIS — M7661 Achilles tendinitis, right leg: Secondary | ICD-10-CM

## 2021-09-21 ENCOUNTER — Ambulatory Visit: Payer: BC Managed Care – PPO | Admitting: Family Medicine

## 2021-09-21 ENCOUNTER — Encounter: Payer: Self-pay | Admitting: Family Medicine

## 2021-09-21 VITALS — BP 132/75 | HR 93 | Temp 98.5°F | Ht 62.0 in | Wt 259.4 lb

## 2021-09-21 DIAGNOSIS — M722 Plantar fascial fibromatosis: Secondary | ICD-10-CM

## 2021-09-21 DIAGNOSIS — Z87898 Personal history of other specified conditions: Secondary | ICD-10-CM | POA: Diagnosis not present

## 2021-09-21 DIAGNOSIS — I10 Essential (primary) hypertension: Secondary | ICD-10-CM

## 2021-09-21 MED ORDER — NITROGLYCERIN 0.4 MG SL SUBL: 0.4000 mg | SUBLINGUAL_TABLET | SUBLINGUAL | 0 refills | Status: DC | PRN

## 2021-09-21 MED ORDER — PREDNISONE 20 MG PO TABS: 40.0000 mg | ORAL_TABLET | Freq: Every day | ORAL | 0 refills | Status: AC

## 2021-09-21 NOTE — Patient Instructions (Signed)
Plantar Fasciitis  Plantar fasciitis is a painful foot condition that affects the heel. It occurs when the band of tissue that connects the toes to the heel bone (plantar fascia) becomes irritated. This can happen as the result of exercising too much or doing other repetitive activities (overuse injury). Plantar fasciitis can cause mild irritation to severe pain that makes it difficult to walk or move. The pain is usually worse in the morning after sleeping, or after sitting or lying down for a period of time. Pain may also be worse after long periods of walking or standing. What are the causes? This condition may be caused by: Standing for long periods of time. Wearing shoes that do not have good arch support. Doing activities that put stress on joints (high-impact activities). This includes ballet and exercise that makes your heart beat faster (aerobic exercise), such as running. Being overweight. An abnormal way of walking (gait). Tight muscles in the back of your lower leg (calf). High arches in your feet or flat feet. Starting a new athletic activity. What are the signs or symptoms? The main symptom of this condition is heel pain. Pain may get worse after the following: Taking the first steps after a time of rest, especially in the morning after awakening, or after you have been sitting or lying down for a while. Long periods of standing still. Pain may decrease after 30-45 minutes of activity, such as gentle walking. How is this diagnosed? This condition may be diagnosed based on your medical history, a physical exam, and your symptoms. Your health care provider will check for: A tender area on the bottom of your foot. A high arch in your foot or flat feet. Pain when you move your foot. Difficulty moving your foot. You may have imaging tests to confirm the diagnosis, such as: X-rays. Ultrasound. MRI. How is this treated? Treatment for plantar fasciitis depends on how severe your  condition is. Treatment may include: Rest, ice, pressure (compression), and raising (elevating) the affected foot. This is called RICE therapy. Your health care provider may recommend RICE therapy along with over-the-counter pain medicines to manage your pain. Exercises to stretch your calves and your plantar fascia. A splint that holds your foot in a stretched, upward position while you sleep (night splint). Physical therapy to relieve symptoms and prevent problems in the future. Injections of steroid medicine (cortisone) to relieve pain and inflammation. Stimulating your plantar fascia with electrical impulses (extracorporeal shock wave therapy). This is usually the last treatment option before surgery. Surgery, if other treatments have not worked after 12 months. Follow these instructions at home: Managing pain, stiffness, and swelling  If directed, put ice on the painful area. To do this: Put ice in a plastic bag, or use a frozen bottle of water. Place a towel between your skin and the bag or bottle. Roll the bottom of your foot over the bag or bottle. Do this for 20 minutes, 2-3 times a day. Wear athletic shoes that have air-sole or gel-sole cushions, or try soft shoe inserts that are designed for plantar fasciitis. Elevate your foot above the level of your heart while you are sitting or lying down. Activity Avoid activities that cause pain. Ask your health care provider what activities are safe for you. Do physical therapy exercises and stretches as told by your health care provider. Try activities and forms of exercise that are easier on your joints (low impact). Examples include swimming, water aerobics, and biking. General instructions Take over-the-counter   and prescription medicines only as told by your health care provider. Wear a night splint while sleeping, if told by your health care provider. Loosen the splint if your toes tingle, become numb, or turn cold and blue. Maintain a  healthy weight, or work with your health care provider to lose weight as needed. Keep all follow-up visits. This is important. Contact a health care provider if you have: Symptoms that do not go away with home treatment. Pain that gets worse. Pain that affects your ability to move or do daily activities. Summary Plantar fasciitis is a painful foot condition that affects the heel. It occurs when the band of tissue that connects the toes to the heel bone (plantar fascia) becomes irritated. Heel pain is the main symptom of this condition. It may get worse after exercising too much or standing still for a long time. Treatment varies, but it usually starts with rest, ice, pressure (compression), and raising (elevating) the affected foot. This is called RICE therapy. Over-the-counter medicines can also be used to manage pain. This information is not intended to replace advice given to you by your health care provider. Make sure you discuss any questions you have with your health care provider. Document Revised: 08/09/2019 Document Reviewed: 08/09/2019 Elsevier Patient Education  2023 [employer] Inc[employer]  

## 2021-09-21 NOTE — Progress Notes (Signed)
   Acute Office Visit  Subjective:     Patient ID: Hayley Crawford, female    DOB: February 07, 1966, 56 y.o.   MRN: [id number]  Chief Complaint  Patient presents with   Plantar Fasciitis    HPI Patient is in today for plantar fasciitis for the last 3 months. This is in her left foot. She has been seen by podiatry for this diagnosis. She reports pain in her left foot when waking and with the first steps after sitting for a period of time. She has been taking voltaren without improvement. She has not had any steroid or injections recently. She has not been doing stretching consistently.   She would also like a refill on his nitro to have on hand. She has a full expired bottle. She has never had to take this but like to keep it on hand. She denies chest pain, shortness of breath, edema, dizziness, orthopnea, or focal weakness.   ROS As per HPI.      Objective:    BP 132/75   Pulse 93   Temp 98.5 F (36.9 C) (Temporal)   Ht '5\' 2"'$  (1.575 m)   Wt 259 lb 6 oz (117.7 kg)   LMP 05/31/2014   SpO2 95%   BMI 47.44 kg/m    Physical Exam Vitals and nursing note reviewed.  Constitutional:      General: She is not in acute distress.    Appearance: She is not ill-appearing, toxic-appearing or diaphoretic.  Cardiovascular:     Rate and Rhythm: Normal rate.     Heart sounds: Normal heart sounds. No murmur heard. Pulmonary:     Effort: Pulmonary effort is normal. No respiratory distress.     Breath sounds: Normal breath sounds.  Musculoskeletal:     Right lower leg: No edema.     Left lower leg: No edema.     Left foot: Normal range of motion.  Feet:     Left foot:     Skin integrity: No ulcer, blister, skin breakdown, erythema or warmth.  Skin:    General: Skin is warm and dry.  Neurological:     Mental Status: She is alert.  Psychiatric:        Mood and Affect: Mood normal.        Behavior: Behavior normal.    No results found for any visits on 09/21/21.      Assessment & Plan:    [name] was seen today for plantar fasciitis.  Diagnoses and all orders for this visit:  Plantar fasciitis, left Chronic. Discussed stretching. Prednisone burst as below. Continue voltaren. Follow up with podiatry prn.  -     predniSONE (DELTASONE) 20 MG tablet; Take 2 tablets (40 mg total) by mouth daily with breakfast for 5 days.  Primary Hypertension BP well controlled.   History of chest pain Refill provided as other have expired. Denies chest pain or other symptoms. She has never had to take nitro.  -     nitroGLYCERIN (NITROSTAT) 0.4 MG SL tablet; Place 1 tablet (0.4 mg total) under the tongue every 5 (five) minutes as needed for chest pain.  Return if symptoms worsen or fail to improve.  The patient indicates understanding of these issues and agrees with the plan.  Gwenlyn Perking, FNP

## 2021-10-02 ENCOUNTER — Encounter: Payer: Self-pay | Admitting: Family Medicine

## 2021-10-02 ENCOUNTER — Ambulatory Visit: Payer: BC Managed Care – PPO | Admitting: Family Medicine

## 2021-10-02 VITALS — BP 154/82 | HR 110 | Temp 100.4°F | Ht 62.0 in | Wt 258.6 lb

## 2021-10-02 DIAGNOSIS — J40 Bronchitis, not specified as acute or chronic: Secondary | ICD-10-CM

## 2021-10-02 DIAGNOSIS — J329 Chronic sinusitis, unspecified: Secondary | ICD-10-CM

## 2021-10-02 MED ORDER — AMOXICILLIN-POT CLAVULANATE 875-125 MG PO TABS: 1.0000 | ORAL_TABLET | Freq: Two times a day (BID) | ORAL | 0 refills | Status: DC

## 2021-10-02 NOTE — Progress Notes (Signed)
Chief Complaint  Patient presents with   Cough   Nasal Congestion   Sore Throat   Fever   Headache    HPI  Patient presents today for Patient presents with upper respiratory congestion. Rhinorrhea that is frequently purulent. There is moderate sore throat. Patient reports coughing frequently as well.  No sputum noted. There is no fever, chills or sweats. The patient denies being short of breath. Onset was 3-5 days ago. Gradually worsening. Tried OTCs without improvement.  PMH: Smoking status noted ROS: Per HPI  Objective: BP (!) 154/82   Pulse (!) 110   Temp (!) 100.4 F (38 C)   Ht '5\' 2"'$  (1.575 m)   Wt 258 lb 9.6 oz (117.3 kg)   LMP 05/31/2014   SpO2 94%   BMI 47.30 kg/m  Gen: NAD, alert, cooperative with exam HEENT: NCAT, Nasal passages swollen, red TMS RED CV: RRR, good S1/S2, no murmur Resp: Bronchitis changes with scattered wheezes, non-labored Ext: No edema, warm Neuro: Alert and oriented, No gross deficits  Assessment and plan:  1. Sinobronchitis     Meds ordered this encounter  Medications   amoxicillin-clavulanate (AUGMENTIN) 875-125 MG tablet    Sig: Take 1 tablet by mouth 2 (two) times daily. Take all of this medication    Dispense:  20 tablet    Refill:  0    No orders of the defined types were placed in this encounter.   Follow up as needed.  Claretta Fraise, MD

## 2021-10-05 ENCOUNTER — Telehealth: Payer: BC Managed Care – PPO | Admitting: Physician Assistant

## 2021-10-05 ENCOUNTER — Encounter: Payer: Self-pay | Admitting: Family Medicine

## 2021-10-05 DIAGNOSIS — J329 Chronic sinusitis, unspecified: Secondary | ICD-10-CM | POA: Diagnosis not present

## 2021-10-05 DIAGNOSIS — J40 Bronchitis, not specified as acute or chronic: Secondary | ICD-10-CM

## 2021-10-05 MED ORDER — IPRATROPIUM BROMIDE 0.03 % NA SOLN: 2.0000 | Freq: Two times a day (BID) | NASAL | 0 refills | Status: DC

## 2021-10-05 MED ORDER — BENZONATATE 100 MG PO CAPS: 100.0000 mg | ORAL_CAPSULE | Freq: Three times a day (TID) | ORAL | 0 refills | Status: DC | PRN

## 2021-10-05 NOTE — Progress Notes (Signed)
E-Visit for Sinus Problems  We are sorry that you are not feeling well.  Here is how we plan to help!  Based on what you have shared with me it looks like you have sinusitis.  Sinusitis is inflammation and infection in the sinus cavities of the head.  Based on your presentation I believe you most likely have Acute Bacterial Sinusitis.  This is an infection caused by bacteria and is treated with antibiotics. I have prescribed Ipratropium nasal spray and tessalon perles. You may use an oral decongestant such as Mucinex D or if you have glaucoma or high blood pressure use plain Mucinex. Saline nasal spray help and can safely be used as often as needed for congestion.  If you develop worsening sinus pain, fever or notice severe headache and vision changes, or if symptoms are not better after completion of antibiotic, please schedule an appointment with a health care provider.    Sinus infections are not as easily transmitted as other respiratory infection, however we still recommend that you avoid close contact with loved ones, especially the very young and elderly.  Remember to wash your hands thoroughly throughout the day as this is the number one way to prevent the spread of infection!  Home Care: Only take medications as instructed by your medical team. Complete the entire course of an antibiotic. Do not take these medications with alcohol. A steam or ultrasonic humidifier can help congestion.  You can place a towel over your head and breathe in the steam from hot water coming from a faucet. Avoid close contacts especially the very young and the elderly. Cover your mouth when you cough or sneeze. Always remember to wash your hands.  Get Help Right Away If: You develop worsening fever or sinus pain. You develop a severe head ache or visual changes. Your symptoms persist after you have completed your treatment plan.  Make sure you Understand these instructions. Will watch your condition. Will  get help right away if you are not doing well or get worse.  Thank you for choosing an e-visit.  Your e-visit answers were reviewed by a board certified advanced clinical practitioner to complete your personal care plan. Depending upon the condition, your plan could have included both over the counter or prescription medications.  Please review your pharmacy choice. Make sure the pharmacy is open so you can pick up prescription now. If there is a problem, you may contact your provider through [employer] and have the prescription routed to another pharmacy.  Your safety is important to [place]. If you have drug allergies check your prescription carefully.   For the next 24 hours you can use MyChart to ask questions about today's visit, request a non-urgent call back, or ask for a work or school excuse. You will get an email in the next two days asking about your experience. I hope that your e-visit has been valuable and will speed your recovery.  I provided 5 minutes of non face-to-face time during this encounter for chart review and documentation.

## 2021-10-08 ENCOUNTER — Encounter: Payer: Self-pay | Admitting: *Deleted

## 2021-10-08 NOTE — Telephone Encounter (Signed)
That is okay with me. Could you write it please? WS

## 2021-10-23 ENCOUNTER — Other Ambulatory Visit: Payer: Self-pay | Admitting: Family

## 2021-10-23 DIAGNOSIS — M7661 Achilles tendinitis, right leg: Secondary | ICD-10-CM

## 2021-10-23 DIAGNOSIS — I10 Essential (primary) hypertension: Secondary | ICD-10-CM

## 2021-12-05 ENCOUNTER — Other Ambulatory Visit: Payer: Self-pay | Admitting: Family

## 2021-12-05 DIAGNOSIS — K219 Gastro-esophageal reflux disease without esophagitis: Secondary | ICD-10-CM

## 2021-12-21 ENCOUNTER — Encounter: Payer: Self-pay | Admitting: Family

## 2021-12-21 ENCOUNTER — Ambulatory Visit: Payer: BC Managed Care – PPO | Admitting: Family

## 2021-12-21 VITALS — BP 132/79 | HR 72 | Temp 98.0°F | Ht 62.0 in | Wt 264.0 lb

## 2021-12-21 DIAGNOSIS — J40 Bronchitis, not specified as acute or chronic: Secondary | ICD-10-CM

## 2021-12-21 DIAGNOSIS — K219 Gastro-esophageal reflux disease without esophagitis: Secondary | ICD-10-CM

## 2021-12-21 DIAGNOSIS — J329 Chronic sinusitis, unspecified: Secondary | ICD-10-CM

## 2021-12-21 DIAGNOSIS — M7661 Achilles tendinitis, right leg: Secondary | ICD-10-CM

## 2021-12-21 DIAGNOSIS — D509 Iron deficiency anemia, unspecified: Secondary | ICD-10-CM

## 2021-12-21 DIAGNOSIS — Z6841 Body Mass Index (BMI) 40.0 and over, adult: Secondary | ICD-10-CM

## 2021-12-21 DIAGNOSIS — E559 Vitamin D deficiency, unspecified: Secondary | ICD-10-CM

## 2021-12-21 DIAGNOSIS — F411 Generalized anxiety disorder: Secondary | ICD-10-CM

## 2021-12-21 DIAGNOSIS — E785 Hyperlipidemia, unspecified: Secondary | ICD-10-CM

## 2021-12-21 DIAGNOSIS — Z0000 Encounter for general adult medical examination without abnormal findings: Secondary | ICD-10-CM

## 2021-12-21 DIAGNOSIS — I10 Essential (primary) hypertension: Secondary | ICD-10-CM

## 2021-12-21 DIAGNOSIS — E538 Deficiency of other specified B group vitamins: Secondary | ICD-10-CM

## 2021-12-21 DIAGNOSIS — Z0001 Encounter for general adult medical examination with abnormal findings: Secondary | ICD-10-CM | POA: Diagnosis not present

## 2021-12-21 DIAGNOSIS — Z87898 Personal history of other specified conditions: Secondary | ICD-10-CM

## 2021-12-21 DIAGNOSIS — J449 Chronic obstructive pulmonary disease, unspecified: Secondary | ICD-10-CM

## 2021-12-21 MED ORDER — IPRATROPIUM BROMIDE 0.03 % NA SOLN: 2.0000 | Freq: Two times a day (BID) | NASAL | 0 refills | Status: AC

## 2021-12-21 MED ORDER — CYCLOBENZAPRINE HCL 10 MG PO TABS: 10.0000 mg | ORAL_TABLET | Freq: Three times a day (TID) | ORAL | 1 refills | Status: DC | PRN

## 2021-12-21 MED ORDER — HYDROCHLOROTHIAZIDE 25 MG PO TABS: 25.0000 mg | ORAL_TABLET | Freq: Every day | ORAL | 0 refills | Status: DC

## 2021-12-21 MED ORDER — LOSARTAN POTASSIUM 100 MG PO TABS: 100.0000 mg | ORAL_TABLET | Freq: Every day | ORAL | 3 refills | Status: DC

## 2021-12-21 MED ORDER — NITROGLYCERIN 0.4 MG SL SUBL: 0.4000 mg | SUBLINGUAL_TABLET | SUBLINGUAL | 0 refills | Status: DC | PRN

## 2021-12-21 MED ORDER — OMEPRAZOLE 20 MG PO CPDR: 20.0000 mg | DELAYED_RELEASE_CAPSULE | Freq: Every day | ORAL | 0 refills | Status: DC

## 2021-12-21 MED ORDER — DICLOFENAC SODIUM 75 MG PO TBEC: 75.0000 mg | DELAYED_RELEASE_TABLET | Freq: Two times a day (BID) | ORAL | 2 refills | Status: DC

## 2021-12-21 MED ORDER — PROMETHAZINE HCL 25 MG PO TABS: ORAL_TABLET | ORAL | 2 refills | Status: DC

## 2021-12-21 MED ORDER — BUSPIRONE HCL 10 MG PO TABS: 10.0000 mg | ORAL_TABLET | Freq: Three times a day (TID) | ORAL | 1 refills | Status: DC | PRN

## 2021-12-21 MED ORDER — PREDNISONE 10 MG (21) PO TBPK: ORAL_TABLET | ORAL | 0 refills | Status: DC

## 2021-12-21 NOTE — Progress Notes (Signed)
Subjective:    Patient ID: Hayley Crawford, female    DOB: Sep 20, 1965, 56 y.o.   MRN: [id number]  Chief Complaint  Patient presents with   Muscle Pain    Left side down the whole leg    PT presents to the office today for CPE. She has COPD and reports mild SOB with exertion. She quit smoking approx  2018.  She has achillis bursitis and heel spur is followed by Podiatry.  Muscle Pain Pertinent negatives include no shortness of breath.  Back Pain This is a recurrent problem. The current episode started more than 1 month ago. The problem occurs constantly. The problem has been waxing and waning since onset. The pain is present in the gluteal. The quality of the pain is described as aching. The pain radiates to the left thigh. The pain is at a severity of 5/10. The pain is moderate. The symptoms are aggravated by standing and twisting. Associated symptoms include leg pain and tingling. Risk factors include obesity. She has tried bed rest and muscle relaxant for the symptoms. The treatment provided mild relief.  Hypertension This is a chronic problem. The current episode started more than 1 year ago. The problem has been resolved since onset. The problem is controlled. Associated symptoms include anxiety. Pertinent negatives include no malaise/fatigue, peripheral edema or shortness of breath. Risk factors for coronary artery disease include dyslipidemia and obesity. The current treatment provides moderate improvement. There is no history of heart failure.  Gastroesophageal Reflux She complains of belching and heartburn. This is a chronic problem. The current episode started more than 1 year ago. The problem occurs occasionally. Risk factors include obesity. She has tried a PPI for the symptoms. The treatment provided moderate relief.  Anemia Presents for follow-up visit. There has been no bruising/bleeding easily or malaise/fatigue. There is no history of heart failure.  Hyperlipidemia This is a  chronic problem. The current episode started more than 1 year ago. The problem is controlled. Recent lipid tests were reviewed and are normal. Exacerbating diseases include obesity. Associated symptoms include leg pain. Pertinent negatives include no shortness of breath. Current antihyperlipidemic treatment includes statins. The current treatment provides moderate improvement of lipids. Risk factors for coronary artery disease include hypertension, a sedentary lifestyle, obesity and family history.  Anxiety Presents for follow-up visit. Symptoms include excessive worry, irritability, nervous/anxious behavior and restlessness. Patient reports no shortness of breath. Symptoms occur occasionally. The severity of symptoms is moderate.   Her past medical history is significant for anemia.      Review of Systems  Constitutional:  Positive for irritability. Negative for malaise/fatigue.  Respiratory:  Negative for shortness of breath.   Gastrointestinal:  Positive for heartburn.  Musculoskeletal:  Positive for back pain.  Neurological:  Positive for tingling.  Hematological:  Does not bruise/bleed easily.  Psychiatric/Behavioral:  The patient is nervous/anxious.   All other systems reviewed and are negative.  Family History  Problem Relation Age of Onset   Diabetes Other    Cancer Mother        lung   Brain cancer Mother        tumor   Social History   Socioeconomic History   Marital status: Single    Spouse name: Not on file   Number of children: Not on file   Years of education: Not on file   Highest education level: Not on file  Occupational History   Not on file  Tobacco Use   Smoking status:  Former    Packs/day: 1.50    Types: Cigarettes    Quit date: 01/09/2018    Years since quitting: 3.9   Smokeless tobacco: Never  Vaping Use   Vaping Use: Never used  Substance and Sexual Activity   Alcohol use: Yes   Drug use: No   Sexual activity: Never  Other Topics Concern   Not  on file  Social History Narrative   Not on file   Social Determinants of Health   Financial Resource Strain: Not on file  Food Insecurity: Not on file  Transportation Needs: Not on file  Physical Activity: Not on file  Stress: Not on file  Social Connections: Not on file       Objective:   Physical Exam Vitals reviewed.  Constitutional:      General: She is not in acute distress.    Appearance: She is well-developed. She is obese.  HENT:     Head: Normocephalic and atraumatic.     Right Ear: Tympanic membrane normal.     Left Ear: Tympanic membrane normal.  Eyes:     Pupils: Pupils are equal, round, and reactive to light.  Neck:     Thyroid: No thyromegaly.  Cardiovascular:     Rate and Rhythm: Normal rate and regular rhythm.     Heart sounds: Normal heart sounds. No murmur heard. Pulmonary:     Effort: Pulmonary effort is normal. No respiratory distress.     Breath sounds: Normal breath sounds. No wheezing.  Abdominal:     General: Bowel sounds are normal. There is no distension.     Palpations: Abdomen is soft.     Tenderness: There is no abdominal tenderness.  Musculoskeletal:        General: No tenderness. Normal range of motion.     Cervical back: Normal range of motion and neck supple.     Right lower leg: Edema (trace) present.     Left lower leg: Edema (trace) present.  Skin:    General: Skin is warm and dry.  Neurological:     Mental Status: She is alert and oriented to person, place, and time.     Cranial Nerves: No cranial nerve deficit.     Deep Tendon Reflexes: Reflexes are normal and symmetric.  Psychiatric:        Behavior: Behavior normal.        Thought Content: Thought content normal.        Judgment: Judgment normal.       BP 132/79   Pulse 72   Temp 98 F (36.7 C) (Temporal)   Ht _0  (1.575 m)   Wt 264 lb (119.7 kg)   LMP 05/31/2014   BMI 48.29 kg/m      Assessment & Plan:   Hayley Crawford comes in today with chief complaint  of Muscle Pain (Left side down the whole leg )   Diagnosis and orders addressed:  1. Achilles bursitis of right lower extremity - diclofenac (VOLTAREN) 75 MG EC tablet; Take 1 tablet (75 mg total) by mouth 2 (two) times daily. (NEEDS TO BE SEEN BEFORE NEXT REFILL)  Dispense: 180 tablet; Refill: 2 - CMP14+EGFR - CBC with Differential/Platelet  2. Essential hypertension - losartan (COZAAR) 100 MG tablet; Take 1 tablet (100 mg total) by mouth daily.  Dispense: 90 tablet; Refill: 3 - hydrochlorothiazide (HYDRODIURIL) 25 MG tablet; Take 1 tablet (25 mg total) by mouth daily. (NEEDS TO BE SEEN BEFORE NEXT REFILL)  Dispense:  30 tablet; Refill: 0 - CMP14+EGFR - CBC with Differential/Platelet  3. GAD (generalized anxiety disorder) - busPIRone (BUSPAR) 10 MG tablet; Take 1 tablet (10 mg total) by mouth 3 (three) times daily as needed.  Dispense: 90 tablet; Refill: 1 - CMP14+EGFR - CBC with Differential/Platelet  4. Gastroesophageal reflux disease  - omeprazole (PRILOSEC) 20 MG capsule; Take 1 capsule (20 mg total) by mouth daily. (NEEDS TO BE SEEN BEFORE NEXT REFILL)  Dispense: 30 capsule; Refill: 0 - CMP14+EGFR - CBC with Differential/Platelet  5. History of chest pain - nitroGLYCERIN (NITROSTAT) 0.4 MG SL tablet; Place 1 tablet (0.4 mg total) under the tongue every 5 (five) minutes as needed for chest pain.  Dispense: 10 tablet; Refill: 0 - CMP14+EGFR - CBC with Differential/Platelet  6. Sinobronchitis - ipratropium (ATROVENT) 0.03 % nasal spray; Place 2 sprays into both nostrils every 12 (twelve) hours.  Dispense: 30 mL; Refill: 0 - CMP14+EGFR - CBC with Differential/Platelet  7. Primary hypertension - CMP14+EGFR - CBC with Differential/Platelet  8. Gastroesophageal reflux disease, unspecified whether esophagitis present - CMP14+EGFR - CBC with Differential/Platelet  9. Chronic obstructive pulmonary disease, unspecified COPD type ([place]) - CMP14+EGFR - CBC with  Differential/Platelet  10. Hyperlipidemia, unspecified hyperlipidemia type - CMP14+EGFR - CBC with Differential/Platelet  11. Vitamin D deficiency - CMP14+EGFR - CBC with Differential/Platelet  12. Vitamin B 12 deficiency - CMP14+EGFR - CBC with Differential/Platelet  13. Morbid obesity with BMI of 40.0-44.9, adult (HCC) - CMP14+EGFR - CBC with Differential/Platelet  14. Iron deficiency anemia, unspecified iron deficiency anemia type - CMP14+EGFR - CBC with Differential/Platelet  15. Annual physical exam - CMP14+EGFR - CBC with Differential/Platelet - Lipid panel - TSH - VITAMIN D 25 Hydroxy (Vit-D Deficiency, Fractures)   Labs pending Health Maintenance reviewed Diet and exercise encouraged  Follow up plan: 6 months    [name], FNP

## 2021-12-21 NOTE — Patient Instructions (Signed)

## 2021-12-22 LAB — CMP14+EGFR
ALT: 38 IU/L — ABNORMAL HIGH (ref 0–32)
AST: 37 IU/L (ref 0–40)
Albumin/Globulin Ratio: 1.3 (ref 1.2–2.2)
Albumin: 4 g/dL (ref 3.8–4.9)
Alkaline Phosphatase: 93 IU/L (ref 44–121)
BUN/Creatinine Ratio: 16 (ref 9–23)
BUN: 9 mg/dL (ref 6–24)
Bilirubin Total: 0.3 mg/dL (ref 0.0–1.2)
CO2: 25 mmol/L (ref 20–29)
Calcium: 9.6 mg/dL (ref 8.7–10.2)
Chloride: 99 mmol/L (ref 96–106)
Creatinine, Ser: 0.57 mg/dL (ref 0.57–1.00)
Globulin, Total: 3.2 g/dL (ref 1.5–4.5)
Glucose: 81 mg/dL (ref 70–99)
Potassium: 4.5 mmol/L (ref 3.5–5.2)
Sodium: 140 mmol/L (ref 134–144)
Total Protein: 7.2 g/dL (ref 6.0–8.5)
eGFR: 105 mL/min/{1.73_m2} (ref >59)

## 2021-12-22 LAB — CBC WITH DIFFERENTIAL/PLATELET
Basophils Absolute: 0 10*3/uL (ref 0.0–0.2)
Basos: 1 %
EOS (ABSOLUTE): 0.2 10*3/uL (ref 0.0–0.4)
Eos: 2 %
Hematocrit: 37.9 % (ref 34.0–46.6)
Hemoglobin: 12.7 g/dL (ref 11.1–15.9)
Immature Grans (Abs): 0 10*3/uL (ref 0.0–0.1)
Immature Granulocytes: 0 %
Lymphocytes Absolute: 1.7 10*3/uL (ref 0.7–3.1)
Lymphs: 23 %
MCH: 31.8 pg (ref 26.6–33.0)
MCHC: 33.5 g/dL (ref 31.5–35.7)
MCV: 95 fL (ref 79–97)
Monocytes Absolute: 0.5 10*3/uL (ref 0.1–0.9)
Monocytes: 6 %
Neutrophils Absolute: 4.9 10*3/uL (ref 1.4–7.0)
Neutrophils: 68 %
Platelets: 302 10*3/uL (ref 150–450)
RBC: 4 x10E6/uL (ref 3.77–5.28)
RDW: 12.2 % (ref 11.7–15.4)
WBC: 7.3 10*3/uL (ref 3.4–10.8)

## 2021-12-22 LAB — LIPID PANEL
Chol/HDL Ratio: 5.1 ratio — ABNORMAL HIGH (ref 0.0–4.4)
Cholesterol, Total: 228 mg/dL — ABNORMAL HIGH (ref 100–199)
HDL: 45 mg/dL (ref >39)
LDL Chol Calc (NIH): 113 mg/dL — ABNORMAL HIGH (ref 0–99)
Triglycerides: 404 mg/dL — ABNORMAL HIGH (ref 0–149)
VLDL Cholesterol Cal: 70 mg/dL — ABNORMAL HIGH (ref 5–40)

## 2021-12-22 LAB — VITAMIN D 25 HYDROXY (VIT D DEFICIENCY, FRACTURES): Vit D, 25-Hydroxy: 24 ng/mL — ABNORMAL LOW (ref 30.0–100.0)

## 2021-12-22 LAB — TSH: TSH: 2.07 u[IU]/mL (ref 0.450–4.500)

## 2021-12-24 ENCOUNTER — Other Ambulatory Visit: Payer: Self-pay | Admitting: Family

## 2021-12-24 MED ORDER — VITAMIN D (ERGOCALCIFEROL) 1.25 MG (50000 UNIT) PO CAPS: 50000.0000 [IU] | ORAL_CAPSULE | ORAL | 3 refills | Status: DC

## 2022-02-08 ENCOUNTER — Telehealth: Payer: BC Managed Care – PPO | Admitting: Physician Assistant

## 2022-02-08 DIAGNOSIS — J069 Acute upper respiratory infection, unspecified: Secondary | ICD-10-CM | POA: Diagnosis not present

## 2022-02-08 MED ORDER — BENZONATATE 100 MG PO CAPS: 100.0000 mg | ORAL_CAPSULE | Freq: Three times a day (TID) | ORAL | 0 refills | Status: DC | PRN

## 2022-02-08 MED ORDER — AZELASTINE HCL 0.1 % NA SOLN: 1.0000 | Freq: Two times a day (BID) | NASAL | 0 refills | Status: AC

## 2022-02-08 MED ORDER — PROMETHAZINE-DM 6.25-15 MG/5ML PO SYRP: 5.0000 mL | ORAL_SOLUTION | Freq: Four times a day (QID) | ORAL | 0 refills | Status: DC | PRN

## 2022-02-08 NOTE — Progress Notes (Signed)
E-Visit for Upper Respiratory Infection   We are sorry you are not feeling well.  Here is how we plan to help!  Based on what you have shared with me, it looks like you may have a viral upper respiratory infection.  Upper respiratory infections are caused by a large number of viruses; however, rhinovirus is the most common cause.   Symptoms vary from person to person, with common symptoms including sore throat, cough, fatigue or lack of energy and feeling of general discomfort.  A low-grade fever of up to 100.4 may present, but is often uncommon.  Symptoms vary however, and are closely related to a person's age or underlying illnesses.  The most common symptoms associated with an upper respiratory infection are nasal discharge or congestion, cough, sneezing, headache and pressure in the ears and face.  These symptoms usually persist for about 3 to 10 days, but can last up to 2 weeks.  It is important to know that upper respiratory infections do not cause serious illness or complications in most cases.    Upper respiratory infections can be transmitted from person to person, with the most common method of transmission being a person's hands.  The virus is able to live on the skin and can infect other persons for up to 2 hours after direct contact.  Also, these can be transmitted when someone coughs or sneezes; thus, it is important to cover the mouth to reduce this risk.  To keep the spread of the illness at [place], good hand hygiene is very important.  This is an infection that is most likely caused by a virus. There are no specific treatments other than to help you with the symptoms until the infection runs its course.  We are sorry you are not feeling well.  Here is how we plan to help!   For nasal congestion, you may use an oral decongestants such as Mucinex D or if you have glaucoma or high blood pressure use plain Mucinex.  Saline nasal spray or nasal drops can help and can safely be used as often as  needed for congestion.  For your congestion, I have prescribed Azelastine nasal spray two sprays in each nostril twice a day  If you do not have a history of heart disease, hypertension, diabetes or thyroid disease, prostate/bladder issues or glaucoma, you may also use Sudafed to treat nasal congestion.  It is highly recommended that you consult with a pharmacist or your primary care physician to ensure this medication is safe for you to take.     If you have a cough, you may use cough suppressants such as Delsym and Robitussin.  If you have glaucoma or high blood pressure, you can also use Coricidin HBP.   For cough I have prescribed for you A prescription cough medication called Tessalon Perles 100 mg. You may take 1-2 capsules every 8 hours as needed for cough and Promethazine DM cough syrup Take 74m every 6 hours as needed for cough.  If you have a sore or scratchy throat, use a saltwater gargle-  to  teaspoon of salt dissolved in a 4-ounce to 8-ounce glass of warm water.  Gargle the solution for approximately 15-30 seconds and then spit.  It is important not to swallow the solution.  You can also use throat lozenges/cough drops and Chloraseptic spray to help with throat pain or discomfort.  Warm or cold liquids can also be helpful in relieving throat pain.  For headache, pain or general  discomfort, you can use Ibuprofen or Tylenol as directed.   Some authorities believe that zinc sprays or the use of Echinacea may shorten the course of your symptoms.   HOME CARE Only take medications as instructed by your medical team. Be sure to drink plenty of fluids. Water is fine as well as fruit juices, sodas and electrolyte beverages. You may want to stay away from caffeine or alcohol. If you are nauseated, try taking small sips of liquids. How do you know if you are getting enough fluid? Your urine should be a pale yellow or almost colorless. Get rest. Taking a steamy shower or using a humidifier may  help nasal congestion and ease sore throat pain. You can place a towel over your head and breathe in the steam from hot water coming from a faucet. Using a saline nasal spray works much the same way. Cough drops, hard candies and sore throat lozenges may ease your cough. Avoid close contacts especially the very young and the elderly Cover your mouth if you cough or sneeze Always remember to wash your hands.   GET HELP RIGHT AWAY IF: You develop worsening fever. If your symptoms do not improve within 10 days You develop yellow or green discharge from your nose over 3 days. You have coughing fits You develop a severe head ache or visual changes. You develop shortness of breath, difficulty breathing or start having chest pain Your symptoms persist after you have completed your treatment plan  MAKE SURE YOU  Understand these instructions. Will watch your condition. Will get help right away if you are not doing well or get worse.  Thank you for choosing an e-visit.  Your e-visit answers were reviewed by a board certified advanced clinical practitioner to complete your personal care plan. Depending upon the condition, your plan could have included both over the counter or prescription medications.  Please review your pharmacy choice. Make sure the pharmacy is open so you can pick up prescription now. If there is a problem, you may contact your provider through [employer] and have the prescription routed to another pharmacy.  Your safety is important to [place]. If you have drug allergies check your prescription carefully.   For the next 24 hours you can use MyChart to ask questions about today's visit, request a non-urgent call back, or ask for a work or school excuse. You will get an email in the next two days asking about your experience. I hope that your e-visit has been valuable and will speed your recovery.  I have spent 5 minutes in review of e-visit questionnaire, review and updating  patient chart, medical decision making and response to patient.   Mar Daring, PA-C

## 2022-02-12 ENCOUNTER — Telehealth: Payer: BC Managed Care – PPO | Admitting: Family Medicine

## 2022-02-12 DIAGNOSIS — J019 Acute sinusitis, unspecified: Secondary | ICD-10-CM | POA: Diagnosis not present

## 2022-02-12 DIAGNOSIS — B9689 Other specified bacterial agents as the cause of diseases classified elsewhere: Secondary | ICD-10-CM

## 2022-02-12 MED ORDER — DOXYCYCLINE HYCLATE 100 MG PO CAPS: 100.0000 mg | ORAL_CAPSULE | Freq: Two times a day (BID) | ORAL | 0 refills | Status: AC

## 2022-02-12 NOTE — Progress Notes (Signed)

## 2022-02-14 ENCOUNTER — Other Ambulatory Visit: Payer: Self-pay | Admitting: Family

## 2022-02-14 DIAGNOSIS — I10 Essential (primary) hypertension: Secondary | ICD-10-CM

## 2022-02-14 DIAGNOSIS — K219 Gastro-esophageal reflux disease without esophagitis: Secondary | ICD-10-CM

## 2022-03-19 ENCOUNTER — Other Ambulatory Visit (HOSPITAL_COMMUNITY): Payer: Self-pay | Admitting: Family

## 2022-03-19 DIAGNOSIS — Z1231 Encounter for screening mammogram for malignant neoplasm of breast: Secondary | ICD-10-CM

## 2022-03-20 ENCOUNTER — Ambulatory Visit (HOSPITAL_COMMUNITY)
Admission: RE | Admit: 2022-03-20 | Discharge: 2022-03-20 | Disposition: A | Discharge status: Home / Self Care | Payer: BC Managed Care – PPO | Source: Ambulatory Visit | Attending: Family | Admitting: Family

## 2022-03-20 DIAGNOSIS — Z1231 Encounter for screening mammogram for malignant neoplasm of breast: Secondary | ICD-10-CM | POA: Insufficient documentation

## 2022-05-09 DIAGNOSIS — D225 Melanocytic nevi of trunk: Secondary | ICD-10-CM | POA: Diagnosis not present

## 2022-05-09 DIAGNOSIS — Z1283 Encounter for screening for malignant neoplasm of skin: Secondary | ICD-10-CM | POA: Diagnosis not present

## 2022-06-07 IMAGING — DX DG ABDOMEN 1V
2 series · 2 of 2 positions shown · non-contrast
Comparison: KUB 04/09/2013.

CLINICAL DATA: 55-year-old female with abdomen and flank pain.

EXAM:
ABDOMEN - 1 VIEW

[abdomen kub (1 of 2)]
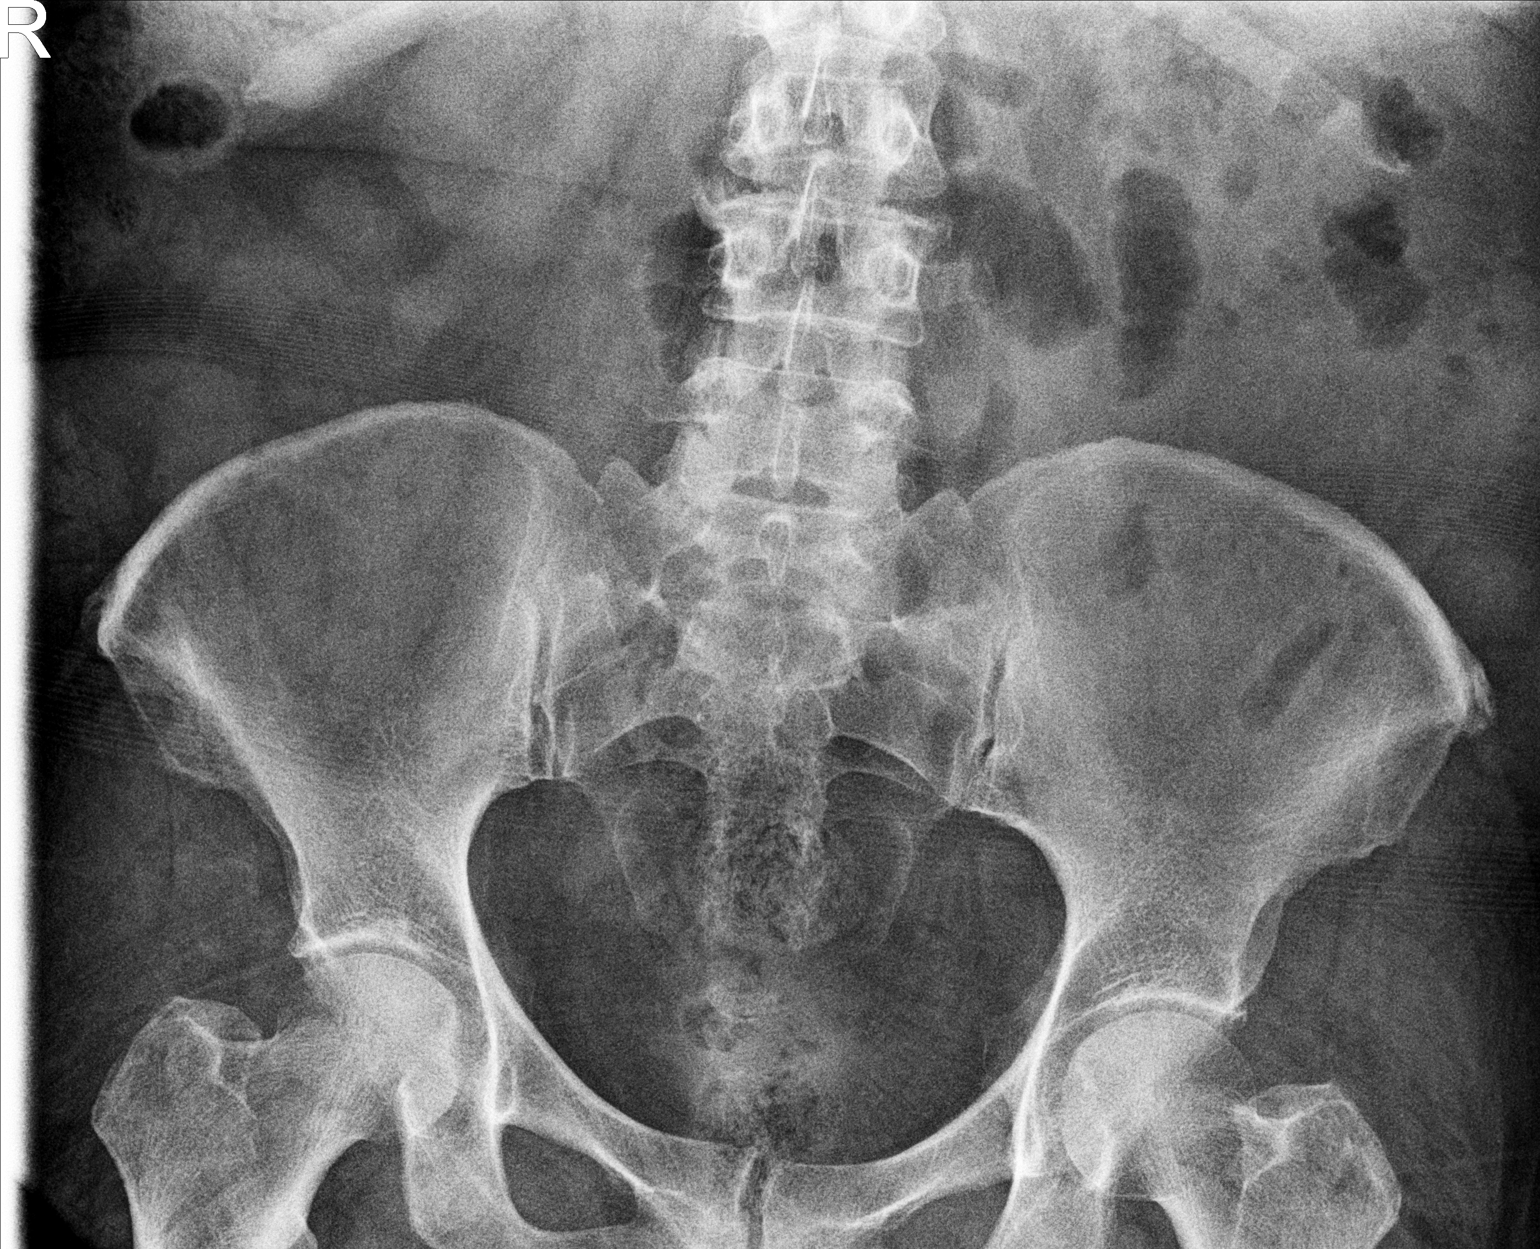

[abdomen kub (2 of 2)]
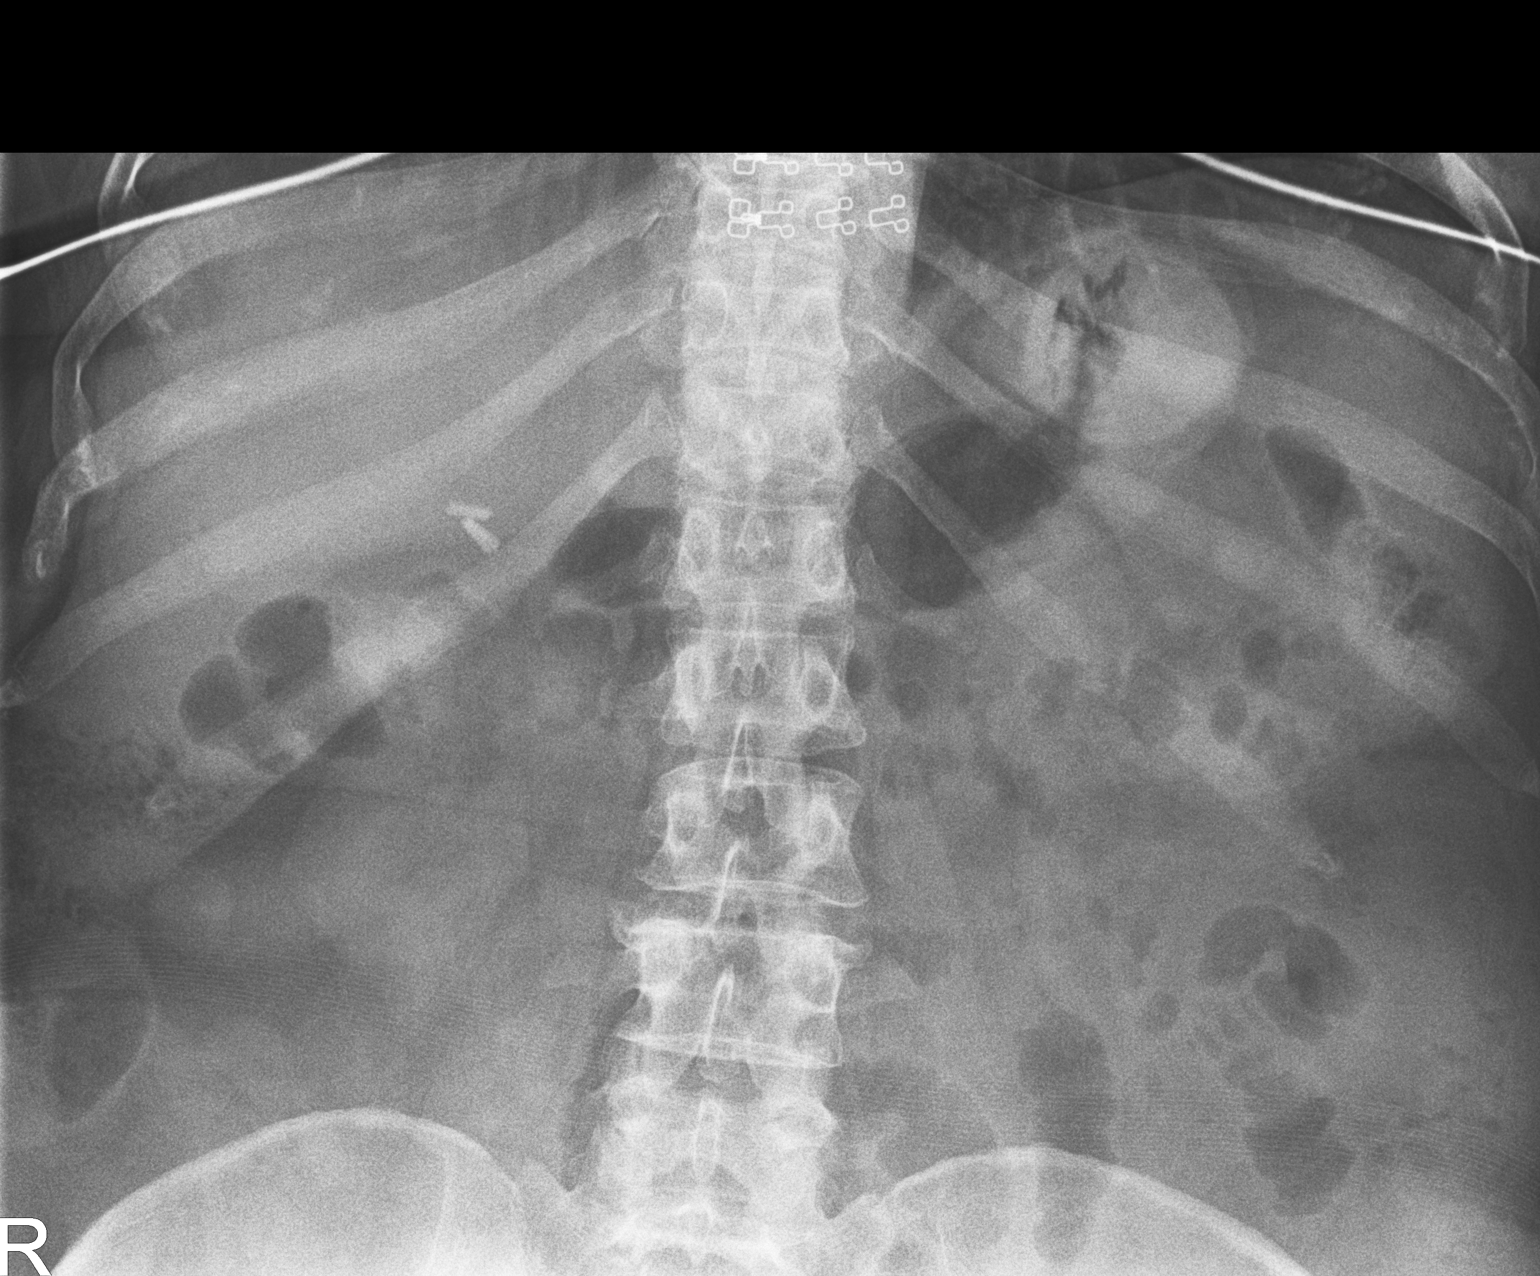

[2 of 2 positions shown; findings below may reference images not displayed]

FINDINGS: Negative visible lung bases. Stable cholecystectomy clips. Non
obstructed bowel gas pattern. No abnormal abdominal or pelvic
calcifications are identified. Visceral contours are within normal
limits. Some chronic disc and endplate degeneration is noted in the
lumbar spine. No acute osseous abnormality identified.
IMPRESSION: Negative.

## 2022-08-06 DIAGNOSIS — M7731 Calcaneal spur, right foot: Secondary | ICD-10-CM | POA: Diagnosis not present

## 2022-08-06 DIAGNOSIS — M79671 Pain in right foot: Secondary | ICD-10-CM | POA: Diagnosis not present

## 2022-08-14 ENCOUNTER — Other Ambulatory Visit: Payer: Self-pay | Admitting: Family

## 2022-08-14 DIAGNOSIS — I10 Essential (primary) hypertension: Secondary | ICD-10-CM

## 2022-08-14 DIAGNOSIS — K219 Gastro-esophageal reflux disease without esophagitis: Secondary | ICD-10-CM

## 2022-08-22 DIAGNOSIS — M7731 Calcaneal spur, right foot: Secondary | ICD-10-CM | POA: Diagnosis not present

## 2022-08-22 DIAGNOSIS — M79671 Pain in right foot: Secondary | ICD-10-CM | POA: Diagnosis not present

## 2022-09-05 DIAGNOSIS — K219 Gastro-esophageal reflux disease without esophagitis: Secondary | ICD-10-CM | POA: Diagnosis not present

## 2022-09-05 DIAGNOSIS — J449 Chronic obstructive pulmonary disease, unspecified: Secondary | ICD-10-CM | POA: Diagnosis not present

## 2022-09-05 DIAGNOSIS — M1990 Unspecified osteoarthritis, unspecified site: Secondary | ICD-10-CM | POA: Diagnosis not present

## 2022-09-05 DIAGNOSIS — G4730 Sleep apnea, unspecified: Secondary | ICD-10-CM | POA: Diagnosis not present

## 2022-09-05 DIAGNOSIS — M778 Other enthesopathies, not elsewhere classified: Secondary | ICD-10-CM | POA: Diagnosis not present

## 2022-09-05 DIAGNOSIS — M7731 Calcaneal spur, right foot: Secondary | ICD-10-CM | POA: Diagnosis not present

## 2022-09-05 DIAGNOSIS — Z79899 Other long term (current) drug therapy: Secondary | ICD-10-CM | POA: Diagnosis not present

## 2022-09-05 DIAGNOSIS — Z87891 Personal history of nicotine dependence: Secondary | ICD-10-CM | POA: Diagnosis not present

## 2022-09-05 DIAGNOSIS — I10 Essential (primary) hypertension: Secondary | ICD-10-CM | POA: Diagnosis not present

## 2022-09-05 DIAGNOSIS — Z882 Allergy status to sulfonamides status: Secondary | ICD-10-CM | POA: Diagnosis not present

## 2022-09-24 DIAGNOSIS — M7731 Calcaneal spur, right foot: Secondary | ICD-10-CM | POA: Diagnosis not present

## 2022-09-25 ENCOUNTER — Other Ambulatory Visit: Payer: Self-pay | Admitting: Family

## 2022-09-25 DIAGNOSIS — M7661 Achilles tendinitis, right leg: Secondary | ICD-10-CM

## 2022-09-25 DIAGNOSIS — I10 Essential (primary) hypertension: Secondary | ICD-10-CM

## 2022-09-25 DIAGNOSIS — K219 Gastro-esophageal reflux disease without esophagitis: Secondary | ICD-10-CM

## 2022-11-02 ENCOUNTER — Other Ambulatory Visit: Payer: Self-pay | Admitting: Family

## 2022-11-02 DIAGNOSIS — I10 Essential (primary) hypertension: Secondary | ICD-10-CM

## 2022-11-02 DIAGNOSIS — K219 Gastro-esophageal reflux disease without esophagitis: Secondary | ICD-10-CM

## 2022-11-29 ENCOUNTER — Encounter (INDEPENDENT_AMBULATORY_CARE_PROVIDER_SITE_OTHER): Payer: BC Managed Care – PPO

## 2022-11-29 DIAGNOSIS — G4733 Obstructive sleep apnea (adult) (pediatric): Secondary | ICD-10-CM | POA: Diagnosis not present

## 2022-12-03 NOTE — Telephone Encounter (Signed)
Hello, I have written a new prescription for you. Where would you like [place] to fax this?  Jannifer Rodney, FNP   Approximately 5 minutes was spent documenting and reviewing patient's chart.   New prescription will be faxed for patient.

## 2022-12-04 ENCOUNTER — Other Ambulatory Visit: Payer: Self-pay | Admitting: Family

## 2022-12-04 DIAGNOSIS — I10 Essential (primary) hypertension: Secondary | ICD-10-CM

## 2022-12-04 DIAGNOSIS — K219 Gastro-esophageal reflux disease without esophagitis: Secondary | ICD-10-CM

## 2022-12-04 NOTE — Telephone Encounter (Signed)
[facility] pt NTBS 30-d given 11/04/22

## 2022-12-04 NOTE — Telephone Encounter (Signed)
Made appt aug 13

## 2022-12-05 MED ORDER — OMEPRAZOLE 20 MG PO CPDR
20.0000 mg | DELAYED_RELEASE_CAPSULE | Freq: Every day | ORAL | 0 refills | Status: DC
Start: 2022-12-05 — End: 2022-12-17

## 2022-12-05 MED ORDER — HYDROCHLOROTHIAZIDE 25 MG PO TABS
25.0000 mg | ORAL_TABLET | Freq: Every day | ORAL | 0 refills | Status: DC
Start: 2022-12-05 — End: 2022-12-17

## 2022-12-05 NOTE — Addendum Note (Signed)
Addended by: Julious Payer D on: 12/05/2022 09:10 AM   Modules accepted: Orders

## 2022-12-17 ENCOUNTER — Encounter: Payer: Self-pay | Admitting: Family

## 2022-12-17 ENCOUNTER — Ambulatory Visit: Payer: BC Managed Care – PPO | Admitting: Family

## 2022-12-17 VITALS — BP 130/75 | HR 76 | Temp 97.9°F | Ht 62.0 in | Wt 269.4 lb

## 2022-12-17 DIAGNOSIS — F39 Unspecified mood [affective] disorder: Secondary | ICD-10-CM

## 2022-12-17 DIAGNOSIS — M5440 Lumbago with sciatica, unspecified side: Secondary | ICD-10-CM

## 2022-12-17 DIAGNOSIS — Z0001 Encounter for general adult medical examination with abnormal findings: Secondary | ICD-10-CM

## 2022-12-17 DIAGNOSIS — E538 Deficiency of other specified B group vitamins: Secondary | ICD-10-CM

## 2022-12-17 DIAGNOSIS — J449 Chronic obstructive pulmonary disease, unspecified: Secondary | ICD-10-CM

## 2022-12-17 DIAGNOSIS — K219 Gastro-esophageal reflux disease without esophagitis: Secondary | ICD-10-CM

## 2022-12-17 DIAGNOSIS — F411 Generalized anxiety disorder: Secondary | ICD-10-CM

## 2022-12-17 DIAGNOSIS — E785 Hyperlipidemia, unspecified: Secondary | ICD-10-CM

## 2022-12-17 DIAGNOSIS — Z0000 Encounter for general adult medical examination without abnormal findings: Secondary | ICD-10-CM

## 2022-12-17 DIAGNOSIS — Z6841 Body Mass Index (BMI) 40.0 and over, adult: Secondary | ICD-10-CM

## 2022-12-17 DIAGNOSIS — I10 Essential (primary) hypertension: Secondary | ICD-10-CM

## 2022-12-17 DIAGNOSIS — E559 Vitamin D deficiency, unspecified: Secondary | ICD-10-CM

## 2022-12-17 DIAGNOSIS — M7661 Achilles tendinitis, right leg: Secondary | ICD-10-CM | POA: Diagnosis not present

## 2022-12-17 DIAGNOSIS — G4733 Obstructive sleep apnea (adult) (pediatric): Secondary | ICD-10-CM

## 2022-12-17 DIAGNOSIS — M791 Myalgia, unspecified site: Secondary | ICD-10-CM

## 2022-12-17 LAB — URINALYSIS, COMPLETE
Bilirubin, UA: NEGATIVE
Glucose, UA: NEGATIVE
Ketones, UA: NEGATIVE
Nitrite, UA: NEGATIVE
Protein,UA: NEGATIVE
RBC, UA: NEGATIVE
Specific Gravity, UA: 1.015 (ref 1.005–1.030)
Urobilinogen, Ur: 0.2 mg/dL (ref 0.2–1.0)
pH, UA: 6 (ref 5.0–7.5)

## 2022-12-17 LAB — MICROSCOPIC EXAMINATION
RBC, Urine: NONE SEEN /hpf (ref 0–2)
Renal Epithel, UA: NONE SEEN /hpf
Yeast, UA: NONE SEEN

## 2022-12-17 MED ORDER — PROMETHAZINE HCL 25 MG PO TABS
ORAL_TABLET | ORAL | 2 refills | Status: DC
Start: 2022-12-17 — End: 2023-07-03

## 2022-12-17 MED ORDER — HYDROCHLOROTHIAZIDE 25 MG PO TABS
25.0000 mg | ORAL_TABLET | Freq: Every day | ORAL | 1 refills | Status: DC
Start: 2022-12-17 — End: 2023-07-02

## 2022-12-17 MED ORDER — BUSPIRONE HCL 10 MG PO TABS
10.0000 mg | ORAL_TABLET | Freq: Three times a day (TID) | ORAL | 1 refills | Status: DC | PRN
Start: 2022-12-17 — End: 2023-07-02

## 2022-12-17 MED ORDER — CYCLOBENZAPRINE HCL 10 MG PO TABS
10.0000 mg | ORAL_TABLET | Freq: Three times a day (TID) | ORAL | 1 refills | Status: DC | PRN
Start: 2022-12-17 — End: 2023-07-02

## 2022-12-17 MED ORDER — OMEPRAZOLE 20 MG PO CPDR
20.0000 mg | DELAYED_RELEASE_CAPSULE | Freq: Every day | ORAL | 2 refills | Status: DC
Start: 2022-12-17 — End: 2023-09-18

## 2022-12-17 MED ORDER — LOSARTAN POTASSIUM 100 MG PO TABS
100.0000 mg | ORAL_TABLET | Freq: Every day | ORAL | 3 refills | Status: DC
Start: 2022-12-17 — End: 2024-01-06

## 2022-12-17 MED ORDER — DICLOFENAC SODIUM 75 MG PO TBEC
75.0000 mg | DELAYED_RELEASE_TABLET | Freq: Two times a day (BID) | ORAL | 2 refills | Status: DC
Start: 2022-12-17 — End: 2023-07-02

## 2022-12-17 NOTE — Progress Notes (Addendum)
Subjective:    Patient ID: Hayley Crawford, female    DOB: [birth year], 57 y.o.   MRN: [id number]  Chief Complaint  Patient presents with   Back Pain    RIGHT SIDE    PT presents to the office today for CPE.   She has COPD and reports mild SOB with exertion. She quit smoking approx  2018.   She has achillis bursitis and heel spur is followed by Podiatry as needed. Had heel spur surgery 05/24.   PT has OSA and uses CPAP nightly. However, her machine has started given her signal that machine has exceeded motor life. Will send rx for new machine.   Reports she can not tolerate statins because of muscle pain.  Back Pain This is a new problem. The current episode started 1 to 4 weeks ago. The problem occurs intermittently. Pain location: right flank pain. The pain is at a severity of 4/10. The pain is mild. Pertinent negatives include no dysuria, fever, headaches, leg pain, paresis, paresthesias or pelvic pain. Treatments tried: force fluids. The treatment provided mild relief.  Hypertension This is a chronic problem. The current episode started more than 1 year ago. The problem has been resolved since onset. Associated symptoms include anxiety, malaise/fatigue, peripheral edema and shortness of breath. Pertinent negatives include no headaches. Risk factors for coronary artery disease include diabetes mellitus, dyslipidemia, obesity and sedentary lifestyle. The current treatment provides moderate improvement.  Gastroesophageal Reflux She complains of belching and heartburn. This is a chronic problem. The current episode started more than 1 year ago. The problem has been waxing and waning. Risk factors include obesity. She has tried a PPI for the symptoms. The treatment provided moderate relief.  Anemia Presents for follow-up visit. Symptoms include malaise/fatigue. There has been no fever or paresthesias.  Hyperlipidemia This is a chronic problem. The current episode started more than 1 year ago.  Exacerbating diseases include obesity. Associated symptoms include shortness of breath. Pertinent negatives include no leg pain. Current antihyperlipidemic treatment includes diet change. The current treatment provides no improvement of lipids. Risk factors for coronary artery disease include dyslipidemia, hypertension, a sedentary lifestyle and post-menopausal.  Anxiety Presents for follow-up visit. Symptoms include excessive worry, nervous/anxious behavior and shortness of breath. Symptoms occur occasionally. The severity of symptoms is moderate.   Her past medical history is significant for anemia.      Review of Systems  Constitutional:  Positive for malaise/fatigue. Negative for fever.  Respiratory:  Positive for shortness of breath.   Gastrointestinal:  Positive for heartburn.  Genitourinary:  Negative for dysuria and pelvic pain.  Musculoskeletal:  Positive for back pain.  Neurological:  Negative for headaches and paresthesias.  Psychiatric/Behavioral:  The patient is nervous/anxious.   All other systems reviewed and are negative.  Family History  Problem Relation Age of Onset   Diabetes Other    Cancer Mother        lung   Brain cancer Mother        tumor   Social History   Socioeconomic History   Marital status: Single    Spouse name: Not on file   Number of children: Not on file   Years of education: Not on file   Highest education level: Not on file  Occupational History   Not on file  Tobacco Use   Smoking status: Former    Current packs/day: 0.00    Types: Cigarettes    Quit date: 01/09/2018    Years since  quitting: 4.9   Smokeless tobacco: Never  Vaping Use   Vaping status: Never Used  Substance and Sexual Activity   Alcohol use: Yes   Drug use: No   Sexual activity: Never  Other Topics Concern   Not on file  Social History Narrative   Not on file   Social Determinants of Health   Financial Resource Strain: Not on file  Food Insecurity: Not on  file  Transportation Needs: Not on file  Physical Activity: Not on file  Stress: Not on file  Social Connections: Not on file       Objective:   Physical Exam Vitals reviewed.  Constitutional:      General: She is not in acute distress.    Appearance: She is well-developed. She is obese.  HENT:     Head: Normocephalic and atraumatic.     Right Ear: Tympanic membrane normal.     Left Ear: Tympanic membrane normal.  Eyes:     Pupils: Pupils are equal, round, and reactive to light.  Neck:     Thyroid: No thyromegaly.  Cardiovascular:     Rate and Rhythm: Normal rate and regular rhythm.     Heart sounds: Normal heart sounds. No murmur heard. Pulmonary:     Effort: Pulmonary effort is normal. No respiratory distress.     Breath sounds: Normal breath sounds. No wheezing.  Abdominal:     General: Bowel sounds are normal. There is no distension.     Palpations: Abdomen is soft.     Tenderness: There is no abdominal tenderness.  Musculoskeletal:        General: No tenderness.     Cervical back: Normal range of motion and neck supple.     Comments: Pain in right thoracic back  Skin:    General: Skin is warm and dry.  Neurological:     Mental Status: She is alert and oriented to person, place, and time.     Cranial Nerves: No cranial nerve deficit.     Deep Tendon Reflexes: Reflexes are normal and symmetric.  Psychiatric:        Behavior: Behavior normal.        Thought Content: Thought content normal.        Judgment: Judgment normal.    BP 130/75   Pulse 76   Temp 97.9 F (36.6 C) (Temporal)   Ht 5\' 2"  (1.575 m)   Wt 269 lb 6.4 oz (122.2 kg)   LMP 05/31/2014   SpO2 99%   BMI 49.27 kg/m      Assessment & Plan:  Hayley Crawford comes in today with chief complaint of Back Pain (RIGHT SIDE ) and Medical Management of Chronic Issues   Diagnosis and orders addressed:  1. GAD (generalized anxiety disorder) - busPIRone (BUSPAR) 10 MG tablet; Take 1 tablet (10 mg total)  by mouth 3 (three) times daily as needed.  Dispense: 90 tablet; Refill: 1 - CBC with Differential/Platelet - CMP14+EGFR  2. Achilles bursitis of right lower extremity - diclofenac (VOLTAREN) 75 MG EC tablet; Take 1 tablet (75 mg total) by mouth 2 (two) times daily.  Dispense: 180 tablet; Refill: 2 - CBC with Differential/Platelet - CMP14+EGFR  3. Essential hypertension - hydrochlorothiazide (HYDRODIURIL) 25 MG tablet; Take 1 tablet (25 mg total) by mouth daily. )  Dispense: 90 tablet; Refill: 1 - losartan (COZAAR) 100 MG tablet; Take 1 tablet (100 mg total) by mouth daily.  Dispense: 90 tablet; Refill: 3 - CBC with  Differential/Platelet - CMP14+EGFR  4. Gastroesophageal reflux disease - omeprazole (PRILOSEC) 20 MG capsule; Take 1 capsule (20 mg total) by mouth daily.  Dispense: 90 capsule; Refill: 2 - CBC with Differential/Platelet - CMP14+EGFR  5. Low back pain with sciatica, sciatica laterality unspecified, unspecified back pain laterality, unspecified chronicity - Urine Culture - Urinalysis, Complete - CBC with Differential/Platelet - CMP14+EGFR  6. Annual physical exam - CBC with Differential/Platelet - CMP14+EGFR - Lipid panel - TSH  7. Chronic obstructive pulmonary disease, unspecified COPD type (HCC) - CBC with Differential/Platelet - CMP14+EGFR  8. Gastroesophageal reflux disease, unspecified whether esophagitis present - CBC with Differential/Platelet - CMP14+EGFR  9. Hyperlipidemia, unspecified hyperlipidemia type - CBC with Differential/Platelet - CMP14+EGFR - Lipid panel  10. Primary hypertension - CBC with Differential/Platelet - CMP14+EGFR  11. Morbid obesity with BMI of 40.0-44.9, adult (HCC) - CBC with Differential/Platelet - CMP14+EGFR  12. Vitamin B 12 deficiency - CBC with Differential/Platelet - CMP14+EGFR  13. Vitamin D deficiency - CBC with Differential/Platelet - CMP14+EGFR  14. Mood disorder (HCC) - CBC with  Differential/Platelet - CMP14+EGFR  15. Myalgia due to statin - CBC with Differential/Platelet - CMP14+EGFR  16. OSA (obstructive sleep apnea) New machine orders faxed to pharmacy  Labs pending Health Maintenance reviewed Diet and exercise encouraged  Follow up plan: 6 months   Jannifer Rodney, FNP

## 2022-12-17 NOTE — Patient Instructions (Signed)

## 2022-12-19 ENCOUNTER — Other Ambulatory Visit: Payer: Self-pay | Admitting: Family

## 2022-12-19 LAB — URINE CULTURE

## 2022-12-19 MED ORDER — AMOXICILLIN-POT CLAVULANATE 875-125 MG PO TABS: 1.0000 | ORAL_TABLET | Freq: Two times a day (BID) | ORAL | 0 refills | Status: DC

## 2022-12-23 ENCOUNTER — Encounter (HOSPITAL_COMMUNITY): Payer: Self-pay | Admitting: Hematology & Oncology

## 2022-12-24 DIAGNOSIS — R109 Unspecified abdominal pain: Secondary | ICD-10-CM

## 2022-12-24 MED ORDER — TAMSULOSIN HCL 0.4 MG PO CAPS: 0.4000 mg | ORAL_CAPSULE | Freq: Every day | ORAL | 3 refills | Status: DC

## 2023-01-03 ENCOUNTER — Other Ambulatory Visit: Payer: Self-pay | Admitting: Family

## 2023-01-03 NOTE — Telephone Encounter (Signed)
Pt wants to know if she needs to keep her appt on 9/13 for CPAP? And if so, does she need to bring her CPAP with her to appt? Leave detailed message if pt can't answer call back.

## 2023-01-10 ENCOUNTER — Other Ambulatory Visit: Payer: Self-pay | Admitting: Family

## 2023-01-10 DIAGNOSIS — G4733 Obstructive sleep apnea (adult) (pediatric): Secondary | ICD-10-CM | POA: Insufficient documentation

## 2023-01-17 ENCOUNTER — Ambulatory Visit (INDEPENDENT_AMBULATORY_CARE_PROVIDER_SITE_OTHER): Payer: BC Managed Care – PPO | Admitting: Family

## 2023-01-17 ENCOUNTER — Ambulatory Visit: Payer: BC Managed Care – PPO | Admitting: Family

## 2023-01-17 VITALS — BP 134/77 | HR 77 | Temp 98.3°F | Ht 62.0 in | Wt 271.2 lb

## 2023-01-17 DIAGNOSIS — E785 Hyperlipidemia, unspecified: Secondary | ICD-10-CM | POA: Diagnosis not present

## 2023-01-17 DIAGNOSIS — I10 Essential (primary) hypertension: Secondary | ICD-10-CM | POA: Diagnosis not present

## 2023-01-17 DIAGNOSIS — G4733 Obstructive sleep apnea (adult) (pediatric): Secondary | ICD-10-CM

## 2023-01-17 NOTE — Patient Instructions (Signed)
Living With Sleep Apnea Sleep apnea is a condition in which breathing pauses or becomes shallow during sleep. Sleep apnea is most commonly caused by a collapsed or blocked airway. People with sleep apnea usually snore loudly. They may have times when they gasp and stop breathing for 10 seconds or more during sleep. This may happen many times during the night. The breaks in breathing also interrupt the deep sleep that you need to feel rested. Even if you do not completely wake up from the gaps in breathing, your sleep may not be restful and you feel tired during the day. You may also have a headache in the morning and low energy during the day, and you may feel anxious or depressed. How can sleep apnea affect me? Sleep apnea increases your chances of extreme tiredness during the day (daytime fatigue). It can also increase your risk for health conditions, such as: Heart attack. Stroke. Obesity. Type 2 diabetes. Heart failure. Irregular heartbeat. High blood pressure. If you have daytime fatigue as a result of sleep apnea, you may be more likely to: Perform poorly at school or work. Fall asleep while driving. Have difficulty with attention. Develop depression or anxiety. Have sexual dysfunction. What actions can I take to manage sleep apnea? Sleep apnea treatment  If you were given a device to open your airway while you sleep, use it only as told by your health care provider. You may be given: An oral appliance. This is a custom-made mouthpiece that shifts your lower jaw forward. A continuous positive airway pressure (CPAP) device. This device blows air through a mask when you breathe out (exhale). A nasal expiratory positive airway pressure (EPAP) device. This device has valves that you put into each nostril. A bi-level positive airway pressure (BIPAP) device. This device blows air through a mask when you breathe in (inhale) and breathe out (exhale). You may need surgery if other treatments  do not work for you. Sleep habits Go to sleep and wake up at the same time every day. This helps set your internal clock (circadian rhythm) for sleeping. If you stay up later than usual, such as on weekends, try to get up in the morning within 2 hours of your normal wake time. Try to get at least 7-9 hours of sleep each night. Stop using a computer, tablet, and mobile phone a few hours before bedtime. Do not take long naps during the day. If you nap, limit it to 30 minutes. Have a relaxing bedtime routine. Reading or listening to music may relax you and help you sleep. Use your bedroom only for sleep. Keep your television and computer out of your bedroom. Keep your bedroom cool, dark, and quiet. Use a supportive mattress and pillows. Follow your health care provider's instructions for other changes to sleep habits. Nutrition Do not eat heavy meals in the evening. Do not have caffeine in the later part of the day. The effects of caffeine can last for more than 5 hours. Follow your health care provider's or dietitian's instructions for any diet changes. Lifestyle     Do not drink alcohol before bedtime. Alcohol can cause you to fall asleep at first, but then it can cause you to wake up in the middle of the night and have trouble getting back to sleep. Do not use any products that contain nicotine or tobacco. These products include cigarettes, chewing tobacco, and vaping devices, such as e-cigarettes. If you need help quitting, ask your health care provider. Medicines Take  over-the-counter and prescription medicines only as told by your health care provider. Do not use over-the-counter sleep medicine. You can become dependent on this medicine, and it can make sleep apnea worse. Do not use medicines, such as sedatives and narcotics, unless told by your health care provider. Activity Exercise on most days, but avoid exercising in the evening. Exercising near bedtime can interfere with  sleeping. If possible, spend time outside every day. Natural light helps regulate your circadian rhythm. General information Lose weight if you need to, and maintain a healthy weight. Keep all follow-up visits. This is important. If you are having surgery, make sure to tell your health care provider that you have sleep apnea. You may need to bring your device with you. Where to find more information Learn more about sleep apnea and daytime fatigue from: American Sleep Association: sleepassociation.org National Sleep Foundation: sleepfoundation.org National Heart, Lung, and Blood Institute: [employer] Summary Sleep apnea is a condition in which breathing pauses or becomes shallow during sleep. Sleep apnea can cause daytime fatigue and other serious health conditions. You may need to wear a device while sleeping to help keep your airway open. If you are having surgery, make sure to tell your health care provider that you have sleep apnea. You may need to bring your device with you. Making changes to sleep habits, diet, lifestyle, and activity can help you manage sleep apnea. This information is not intended to replace advice given to you by your health care provider. Make sure you discuss any questions you have with your health care provider. Document Revised: 11/29/2020 Document Reviewed: 03/31/2020 Elsevier Patient Education  2023 [employer].

## 2023-01-17 NOTE — Progress Notes (Signed)
Subjective:    Patient ID: Hayley Crawford, female    DOB: 1965/09/01, 57 y.o.   MRN: [id number]  Chief Complaint  Patient presents with   Obstructive Sleep Apnea   PT presents to the office today for OSA. She has had this for years. However, her current machine is saying "Motor life exceeded, contact care provider". She is still using it nightly and stable.  Hypertension This is a chronic problem. The current episode started more than 1 year ago. The problem has been resolved since onset. Pertinent negatives include no malaise/fatigue, peripheral edema or shortness of breath. Risk factors for coronary artery disease include dyslipidemia. The current treatment provides moderate improvement.  Hyperlipidemia This is a chronic problem. The current episode started more than 1 year ago. Exacerbating diseases include obesity. Pertinent negatives include no shortness of breath. Current antihyperlipidemic treatment includes diet change. Risk factors for coronary artery disease include dyslipidemia, hypertension and a sedentary lifestyle.      Review of Systems  Constitutional:  Negative for malaise/fatigue.  Respiratory:  Negative for shortness of breath.   All other systems reviewed and are negative.      Objective:   Physical Exam Vitals reviewed.  Constitutional:      General: She is not in acute distress.    Appearance: She is well-developed. She is obese.  HENT:     Head: Normocephalic and atraumatic.     Right Ear: Tympanic membrane normal.     Left Ear: Tympanic membrane normal.  Eyes:     Pupils: Pupils are equal, round, and reactive to light.  Neck:     Thyroid: No thyromegaly.  Cardiovascular:     Rate and Rhythm: Normal rate and regular rhythm.     Heart sounds: Normal heart sounds. No murmur heard. Pulmonary:     Effort: Pulmonary effort is normal. No respiratory distress.     Breath sounds: Normal breath sounds. No wheezing.  Abdominal:     General: Bowel sounds are  normal. There is no distension.     Palpations: Abdomen is soft.     Tenderness: There is no abdominal tenderness.  Musculoskeletal:        General: No tenderness. Normal range of motion.     Cervical back: Normal range of motion and neck supple.     Right lower leg: Edema (trace) present.     Left lower leg: Edema (trace) present.  Skin:    General: Skin is warm and dry.  Neurological:     Mental Status: She is alert and oriented to person, place, and time.     Cranial Nerves: No cranial nerve deficit.     Deep Tendon Reflexes: Reflexes are normal and symmetric.  Psychiatric:        Behavior: Behavior normal.        Thought Content: Thought content normal.        Judgment: Judgment normal.     BP 134/77   Pulse 77   Temp 98.3 F (36.8 C) (Temporal)   Ht 5\' 2"  (1.575 m)   Wt 271 lb 3.2 oz (123 kg)   LMP 05/31/2014   SpO2 96%   BMI 49.60 kg/m      Assessment & Plan:  Hayley Crawford comes in today with chief complaint of Obstructive Sleep Apnea   Diagnosis and orders addressed:  1. OSA (obstructive sleep apnea) Forms completed for new machine  2. Primary hypertension Resolved  3. Hyperlipidemia, unspecified hyperlipidemia type Low fat  diet    Labs pending Health Maintenance reviewed Diet and exercise encouraged  Follow up plan: 6 months    Jannifer Rodney, FNP

## 2023-02-05 DIAGNOSIS — G4733 Obstructive sleep apnea (adult) (pediatric): Secondary | ICD-10-CM | POA: Diagnosis not present

## 2023-03-08 DIAGNOSIS — G4733 Obstructive sleep apnea (adult) (pediatric): Secondary | ICD-10-CM | POA: Diagnosis not present

## 2023-03-14 ENCOUNTER — Encounter: Payer: Self-pay | Admitting: Family

## 2023-03-14 ENCOUNTER — Ambulatory Visit: Payer: BC Managed Care – PPO | Admitting: Family

## 2023-03-14 VITALS — BP 136/80 | HR 96 | Temp 98.6°F | Ht 62.0 in | Wt 274.2 lb

## 2023-03-14 DIAGNOSIS — Z6841 Body Mass Index (BMI) 40.0 and over, adult: Secondary | ICD-10-CM

## 2023-03-14 DIAGNOSIS — Z87891 Personal history of nicotine dependence: Secondary | ICD-10-CM

## 2023-03-14 DIAGNOSIS — E785 Hyperlipidemia, unspecified: Secondary | ICD-10-CM

## 2023-03-14 DIAGNOSIS — J449 Chronic obstructive pulmonary disease, unspecified: Secondary | ICD-10-CM | POA: Diagnosis not present

## 2023-03-14 DIAGNOSIS — Z713 Dietary counseling and surveillance: Secondary | ICD-10-CM

## 2023-03-14 DIAGNOSIS — I10 Essential (primary) hypertension: Secondary | ICD-10-CM

## 2023-03-14 DIAGNOSIS — Z122 Encounter for screening for malignant neoplasm of respiratory organs: Secondary | ICD-10-CM

## 2023-03-14 DIAGNOSIS — K219 Gastro-esophageal reflux disease without esophagitis: Secondary | ICD-10-CM

## 2023-03-14 DIAGNOSIS — F411 Generalized anxiety disorder: Secondary | ICD-10-CM

## 2023-03-14 DIAGNOSIS — G4733 Obstructive sleep apnea (adult) (pediatric): Secondary | ICD-10-CM

## 2023-03-14 DIAGNOSIS — F39 Unspecified mood [affective] disorder: Secondary | ICD-10-CM

## 2023-03-14 MED ORDER — SEMAGLUTIDE-WEIGHT MANAGEMENT 0.25 MG/0.5ML ~~LOC~~ SOAJ
0.2500 mg | SUBCUTANEOUS | 0 refills | Status: AC
Start: 2023-03-14 — End: 2023-04-11

## 2023-03-14 MED ORDER — SEMAGLUTIDE-WEIGHT MANAGEMENT 0.5 MG/0.5ML ~~LOC~~ SOAJ
0.5000 mg | SUBCUTANEOUS | 1 refills | Status: AC
Start: 2023-04-12 — End: 2023-05-10

## 2023-03-14 MED ORDER — ONDANSETRON HCL 4 MG PO TABS: 4.0000 mg | ORAL_TABLET | Freq: Three times a day (TID) | ORAL | 2 refills | Status: DC | PRN

## 2023-03-14 NOTE — Progress Notes (Signed)
Subjective:    Patient ID: Hayley Crawford, female    DOB: Apr 04, 1966, 57 y.o.   MRN: [id number]  Chief Complaint  Patient presents with   Weight Loss    Would medications family hx of thyroid disease    Pt presents to the office today to discuss weight loss medications. She has COPD, OSA, GERD, hyperlipidemia, and mood disorder. Her BMI is 50.   She has OSA and using CPAP nightly. Gastroesophageal Reflux She complains of belching and heartburn. She reports no nausea. This is a chronic problem. The current episode started more than 1 year ago. The problem occurs occasionally. Risk factors include obesity. She has tried a PPI for the symptoms. The treatment provided moderate relief.  Hypertension This is a chronic problem. The current episode started more than 1 year ago. The problem has been resolved since onset. The problem is controlled. Associated symptoms include anxiety and peripheral edema. Pertinent negatives include no shortness of breath. Risk factors for coronary artery disease include dyslipidemia, obesity and sedentary lifestyle. The current treatment provides moderate improvement.  Hyperlipidemia This is a chronic problem. The current episode started more than 1 year ago. Exacerbating diseases include obesity. Pertinent negatives include no shortness of breath. Current antihyperlipidemic treatment includes diet change. The current treatment provides mild improvement of lipids.  Anxiety Presents for follow-up visit. Symptoms include nervous/anxious behavior and restlessness. Patient reports no decreased concentration, dizziness, dry mouth, nausea or shortness of breath. Symptoms occur occasionally.        Review of Systems  Respiratory:  Negative for shortness of breath.   Gastrointestinal:  Positive for heartburn. Negative for nausea.  Neurological:  Negative for dizziness.  Psychiatric/Behavioral:  Negative for decreased concentration. The patient is nervous/anxious.   All  other systems reviewed and are negative.      Objective:   Physical Exam Vitals reviewed.  Constitutional:      General: She is not in acute distress.    Appearance: She is well-developed. She is obese.  HENT:     Head: Normocephalic and atraumatic.     Right Ear: Tympanic membrane normal.     Left Ear: Tympanic membrane normal.  Eyes:     Pupils: Pupils are equal, round, and reactive to light.  Neck:     Thyroid: No thyromegaly.  Cardiovascular:     Rate and Rhythm: Normal rate and regular rhythm.     Heart sounds: Normal heart sounds. No murmur heard. Pulmonary:     Effort: Pulmonary effort is normal. No respiratory distress.     Breath sounds: Normal breath sounds. No wheezing.  Abdominal:     General: Bowel sounds are normal. There is no distension.     Palpations: Abdomen is soft.     Tenderness: There is no abdominal tenderness.  Musculoskeletal:        General: No tenderness. Normal range of motion.     Cervical back: Normal range of motion and neck supple.  Skin:    General: Skin is warm and dry.  Neurological:     Mental Status: She is alert and oriented to person, place, and time.     Cranial Nerves: No cranial nerve deficit.     Deep Tendon Reflexes: Reflexes are normal and symmetric.  Psychiatric:        Behavior: Behavior normal.        Thought Content: Thought content normal.        Judgment: Judgment normal.  BP 136/80   Pulse 96   Temp 98.6 F (37 C) (Temporal)   Ht 5\' 2"  (1.575 m)   Wt 274 lb 3.2 oz (124.4 kg)   LMP 05/31/2014   SpO2 96%   BMI 50.15 kg/m      Assessment & Plan:  Hayley Crawford comes in today with chief complaint of Weight Loss (Would medications family hx of thyroid disease )   Diagnosis and orders addressed:  1. OSA (obstructive sleep apnea)  2. Morbid obesity with BMI of 40.0-44.9, adult (HCC) - Semaglutide-Weight Management 0.25 MG/0.5ML SOAJ; Inject 0.25 mg into the skin once a week for 28 days.  Dispense: 2  mL; Refill: 0 - Semaglutide-Weight Management 0.5 MG/0.5ML SOAJ; Inject 0.5 mg into the skin once a week for 28 days.  Dispense: 2 mL; Refill: 1  3. Chronic obstructive pulmonary disease, unspecified COPD type (HCC) - Ambulatory Referral Lung Cancer Screening [facility]  4. Gastroesophageal reflux disease, unspecified whether esophagitis present  5. Hyperlipidemia, unspecified hyperlipidemia type  6. Mood disorder (HCC) - Semaglutide-Weight Management 0.25 MG/0.5ML SOAJ; Inject 0.25 mg into the skin once a week for 28 days.  Dispense: 2 mL; Refill: 0 - Semaglutide-Weight Management 0.5 MG/0.5ML SOAJ; Inject 0.5 mg into the skin once a week for 28 days.  Dispense: 2 mL; Refill: 1  7. GAD (generalized anxiety disorder)  8. Primary hypertension  - Semaglutide-Weight Management 0.25 MG/0.5ML SOAJ; Inject 0.25 mg into the skin once a week for 28 days.  Dispense: 2 mL; Refill: 0 - Semaglutide-Weight Management 0.5 MG/0.5ML SOAJ; Inject 0.5 mg into the skin once a week for 28 days.  Dispense: 2 mL; Refill: 1  9. Weight loss counseling, encounter for - Semaglutide-Weight Management 0.25 MG/0.5ML SOAJ; Inject 0.25 mg into the skin once a week for 28 days.  Dispense: 2 mL; Refill: 0 - Semaglutide-Weight Management 0.5 MG/0.5ML SOAJ; Inject 0.5 mg into the skin once a week for 28 days.  Dispense: 2 mL; Refill: 1 - ondansetron (ZOFRAN) 4 MG tablet; Take 1 tablet (4 mg total) by mouth every 8 (eight) hours as needed for nausea or vomiting.  Dispense: 30 tablet; Refill: 2  10. Encounter for screening for lung cancer - Ambulatory Referral Lung Cancer Screening [facility]  11. History of smoking - Ambulatory Referral Lung Cancer Screening [facility]   Will start Ozempic 0.25 mg weekly for one month then increase to 0.5 mg  Encouraged small meals  Take stool softeners if constipation Health Maintenance reviewed Diet and exercise encouraged  Follow up plan: 2 months    Jannifer Rodney, FNP

## 2023-03-14 NOTE — Patient Instructions (Signed)
Calorie Counting for Weight Loss Calories are units of energy. Your body needs a certain number of calories from food to keep going throughout the day. When you eat or drink more calories than your body needs, your body stores the extra calories mostly as fat. When you eat or drink fewer calories than your body needs, your body burns fat to get the energy it needs. Calorie counting means keeping track of how many calories you eat and drink each day. Calorie counting can be helpful if you need to lose weight. If you eat fewer calories than your body needs, you should lose weight. Ask your health care provider what a healthy weight is for you. For calorie counting to work, you will need to eat the right number of calories each day to lose a healthy amount of weight per week. A dietitian can help you figure out how many calories you need in a day and will suggest ways to reach your calorie goal. A healthy amount of weight to lose each week is usually 1-2 lb (0.5-0.9 kg). This usually means that your daily calorie intake should be reduced by 500-750 calories. Eating 1,200-1,500 calories a day can help most women lose weight. Eating 1,500-1,800 calories a day can help most men lose weight. What do I need to know about calorie counting? Work with your health care provider or dietitian to determine how many calories you should get each day. To meet your daily calorie goal, you will need to: Find out how many calories are in each food that you would like to eat. Try to do this before you eat. Decide how much of the food you plan to eat. Keep a food log. Do this by writing down what you ate and how many calories it had. To successfully lose weight, it is important to balance calorie counting with a healthy lifestyle that includes regular activity. Where do I find calorie information?  The number of calories in a food can be found on a Nutrition Facts label. If a food does not have a Nutrition Facts label, try  to look up the calories online or ask your dietitian for help. Remember that calories are listed per serving. If you choose to have more than one serving of a food, you will have to multiply the calories per serving by the number of servings you plan to eat. For example, the label on a package of bread might say that a serving size is 1 slice and that there are 90 calories in a serving. If you eat 1 slice, you will have eaten 90 calories. If you eat 2 slices, you will have eaten 180 calories. How do I keep a food log? After each time that you eat, record the following in your food log as soon as possible: What you ate. Be sure to include toppings, sauces, and other extras on the food. How much you ate. This can be measured in cups, ounces, or number of items. How many calories were in each food and drink. The total number of calories in the food you ate. Keep your food log near you, such as in a pocket-sized notebook or on an app or website on your mobile phone. Some programs will calculate calories for you and show you how many calories you have left to meet your daily goal. What are some portion-control tips? Know how many calories are in a serving. This will help you know how many servings you can have of a certain   food. Use a measuring cup to measure serving sizes. You could also try weighing out portions on a kitchen scale. With time, you will be able to estimate serving sizes for some foods. Take time to put servings of different foods on your favorite plates or in your favorite bowls and cups so you know what a serving looks like. Try not to eat straight from a food's packaging, such as from a bag or box. Eating straight from the package makes it hard to see how much you are eating and can lead to overeating. Put the amount you would like to eat in a cup or on a plate to make sure you are eating the right portion. Use smaller plates, glasses, and bowls for smaller portions and to prevent  overeating. Try not to multitask. For example, avoid watching TV or using your computer while eating. If it is time to eat, sit down at a table and enjoy your food. This will help you recognize when you are full. It will also help you be more mindful of what and how much you are eating. What are tips for following this plan? Reading food labels Check the calorie count compared with the serving size. The serving size may be smaller than what you are used to eating. Check the source of the calories. Try to choose foods that are high in protein, fiber, and vitamins, and low in saturated fat, trans fat, and sodium. Shopping Read nutrition labels while you shop. This will help you make healthy decisions about which foods to buy. Pay attention to nutrition labels for low-fat or fat-free foods. These foods sometimes have the same number of calories or more calories than the full-fat versions. They also often have added sugar, starch, or salt to make up for flavor that was removed with the fat. Make a grocery list of lower-calorie foods and stick to it. Cooking Try to cook your favorite foods in a healthier way. For example, try baking instead of frying. Use low-fat dairy products. Meal planning Use more fruits and vegetables. One-half of your plate should be fruits and vegetables. Include lean proteins, such as chicken, turkey, and fish. Lifestyle Each week, aim to do one of the following: 150 minutes of moderate exercise, such as walking. 75 minutes of vigorous exercise, such as running. General information Know how many calories are in the foods you eat most often. This will help you calculate calorie counts faster. Find a way of tracking calories that works for you. Get creative. Try different apps or programs if writing down calories does not work for you. What foods should I eat?  Eat nutritious foods. It is better to have a nutritious, high-calorie food, such as an avocado, than a food with  few nutrients, such as a bag of potato chips. Use your calories on foods and drinks that will fill you up and will not leave you hungry soon after eating. Examples of foods that fill you up are nuts and nut butters, vegetables, lean proteins, and high-fiber foods such as whole grains. High-fiber foods are foods with more than 5 g of fiber per serving. Pay attention to calories in drinks. Low-calorie drinks include water and unsweetened drinks. The items listed above may not be a complete list of foods and beverages you can eat. Contact a dietitian for more information. What foods should I limit? Limit foods or drinks that are not good sources of vitamins, minerals, or protein or that are high in unhealthy fats. These   include: Candy. Other sweets. Sodas, specialty coffee drinks, alcohol, and juice. The items listed above may not be a complete list of foods and beverages you should avoid. Contact a dietitian for more information. How do I count calories when eating out? Pay attention to portions. Often, portions are much larger when eating out. Try these tips to keep portions smaller: Consider sharing a meal instead of getting your own. If you get your own meal, eat only half of it. Before you start eating, ask for a container and put half of your meal into it. When available, consider ordering smaller portions from the menu instead of full portions. Pay attention to your food and drink choices. Knowing the way food is cooked and what is included with the meal can help you eat fewer calories. If calories are listed on the menu, choose the lower-calorie options. Choose dishes that include vegetables, fruits, whole grains, low-fat dairy products, and lean proteins. Choose items that are boiled, broiled, grilled, or steamed. Avoid items that are buttered, battered, fried, or served with cream sauce. Items labeled as crispy are usually fried, unless stated otherwise. Choose water, low-fat milk,  unsweetened iced tea, or other drinks without added sugar. If you want an alcoholic beverage, choose a lower-calorie option, such as a glass of wine or light beer. Ask for dressings, sauces, and syrups on the side. These are usually high in calories, so you should limit the amount you eat. If you want a salad, choose a garden salad and ask for grilled meats. Avoid extra toppings such as bacon, cheese, or fried items. Ask for the dressing on the side, or ask for olive oil and vinegar or lemon to use as dressing. Estimate how many servings of a food you are given. Knowing serving sizes will help you be aware of how much food you are eating at restaurants. Where to find more information Centers for Disease Control and Prevention: www.cdc.gov U.[place]. Department of Agriculture: myplate.gov Summary Calorie counting means keeping track of how many calories you eat and drink each day. If you eat fewer calories than your body needs, you should lose weight. A healthy amount of weight to lose per week is usually 1-2 lb (0.5-0.9 kg). This usually means reducing your daily calorie intake by 500-750 calories. The number of calories in a food can be found on a Nutrition Facts label. If a food does not have a Nutrition Facts label, try to look up the calories online or ask your dietitian for help. Use smaller plates, glasses, and bowls for smaller portions and to prevent overeating. Use your calories on foods and drinks that will fill you up and not leave you hungry shortly after a meal. This information is not intended to replace advice given to you by your health care provider. Make sure you discuss any questions you have with your health care provider. Document Revised: 06/03/2019 Document Reviewed: 06/03/2019 Elsevier Patient Education  2023 Elsevier Inc.  

## 2023-03-25 ENCOUNTER — Other Ambulatory Visit (HOSPITAL_COMMUNITY): Payer: Self-pay

## 2023-03-25 ENCOUNTER — Encounter (HOSPITAL_COMMUNITY): Payer: Self-pay | Admitting: Hematology & Oncology

## 2023-03-25 ENCOUNTER — Telehealth: Payer: Self-pay

## 2023-03-25 NOTE — Telephone Encounter (Signed)
PA request has been Submitted. New Encounter created for follow up. For additional info see Pharmacy Prior Auth telephone encounter from 03/25/23.

## 2023-03-25 NOTE — Telephone Encounter (Signed)
Pharmacy Patient Advocate Encounter   Received notification from Pt Calls Messages that prior authorization for Wegovy 0.25MG /0.5ML auto-injectors is required/requested.   Insurance verification completed.   The patient is insured through [facility] .   Per test claim: PA required; PA started via CoverMyMeds. KEY [id number] . Waiting for clinical questions to populate.

## 2023-03-25 NOTE — Telephone Encounter (Signed)
Copied from CRM [id number]. Topic: Clinical - Medication Question >> Mar 25, 2023 10:31 AM Herbert Seta B wrote: Reason for CRM:  Semaglutide-Weight Management 0.5 MG/0.5ML [facility]  PATIENT NEEDS PA SENT FOR MEDICATION-[facility]

## 2023-03-25 NOTE — Telephone Encounter (Signed)
 Clinical questions have been answered and PA submitted. PA currently Pending.

## 2023-03-27 ENCOUNTER — Other Ambulatory Visit (HOSPITAL_COMMUNITY): Payer: Self-pay

## 2023-03-27 NOTE — Telephone Encounter (Signed)
Pharmacy Patient Advocate Encounter  Received notification from [facility] that Prior Authorization for [facility] has been APPROVED from 03/27/23 to 07/29/23. Ran test claim, Copay is $24.99. This test claim was processed through [facility]- copay amounts may vary at other pharmacies due to pharmacy/plan contracts, or as the patient moves through the different stages of their insurance plan.   PA #/Case ID/Reference #: [id number]

## 2023-04-07 DIAGNOSIS — G4733 Obstructive sleep apnea (adult) (pediatric): Secondary | ICD-10-CM | POA: Diagnosis not present

## 2023-04-10 ENCOUNTER — Encounter: Payer: Self-pay | Admitting: *Deleted

## 2023-04-15 ENCOUNTER — Ambulatory Visit: Payer: BC Managed Care – PPO | Admitting: Nurse Practitioner

## 2023-04-15 ENCOUNTER — Encounter: Payer: Self-pay | Admitting: Nurse Practitioner

## 2023-04-15 VITALS — BP 154/92 | HR 79 | Temp 97.3°F | Resp 20 | Ht 62.0 in | Wt 266.0 lb

## 2023-04-15 DIAGNOSIS — J40 Bronchitis, not specified as acute or chronic: Secondary | ICD-10-CM | POA: Diagnosis not present

## 2023-04-15 DIAGNOSIS — R0981 Nasal congestion: Secondary | ICD-10-CM | POA: Diagnosis not present

## 2023-04-15 LAB — VERITOR FLU A/B WAIVED
Influenza A: NEGATIVE
Influenza B: NEGATIVE

## 2023-04-15 MED ORDER — AZITHROMYCIN 250 MG PO TABS: ORAL_TABLET | ORAL | 0 refills | Status: DC

## 2023-04-15 MED ORDER — METHYLPREDNISOLONE ACETATE 80 MG/ML IJ SUSP
80.0000 mg | Freq: Once | INTRAMUSCULAR | Status: AC
  Administered 2023-04-15: 80 mg via INTRAMUSCULAR

## 2023-04-15 MED ORDER — PROMETHAZINE-DM 6.25-15 MG/5ML PO SYRP: 5.0000 mL | ORAL_SOLUTION | Freq: Four times a day (QID) | ORAL | 0 refills | Status: DC | PRN

## 2023-04-15 NOTE — Addendum Note (Signed)
Addended by: Bennie Pierini on: 04/15/2023 09:24 AM   Modules accepted: Orders, Level of Service

## 2023-04-15 NOTE — Progress Notes (Addendum)
Subjective:    Patient ID: Hayley Crawford, female    DOB: Apr 26, 1966, 57 y.o.   MRN: [id number]   Chief Complaint: Cough, Nasal Congestion, and Back Pain (Started this past Sunday)   URI  This is a new problem. The current episode started in the past 7 days. The problem has been gradually worsening. There has been no fever. Associated symptoms include congestion, coughing, joint pain and rhinorrhea. Pertinent negatives include no headaches or sneezing. Associated symptoms comments: Back pain from coughing. Treatments tried: mucinex. cough and flu. The treatment provided no relief.     Patient Active Problem List   Diagnosis Date Noted   OSA (obstructive sleep apnea) 01/10/2023   Myalgia due to statin 12/17/2022   Morbid obesity with BMI of 40.0-44.9, adult (HCC) 09/29/2015   Metabolic syndrome 03/23/2015   Vitamin D deficiency 06/07/2014   Iron deficiency anemia 06/07/2014   Hx of smoking 06/11/2013   COPD (chronic obstructive pulmonary disease) (HCC)    Hypertension    GAD (generalized anxiety disorder) 08/27/2012   Mood disorder (HCC) 08/27/2012   GERD (gastroesophageal reflux disease) 08/27/2012   Hyperlipidemia 08/27/2012   Vitamin B 12 deficiency 08/27/2012       Review of Systems  Constitutional:  Negative for chills and fever.  HENT:  Positive for congestion and rhinorrhea. Negative for sneezing.   Respiratory:  Positive for cough. Negative for shortness of breath.   Musculoskeletal:  Positive for joint pain.  Neurological:  Negative for headaches.       Objective:   Physical Exam Vitals and nursing note reviewed.  Constitutional:      General: She is not in acute distress.    Appearance: Normal appearance. She is well-developed.  HENT:     Head: Normocephalic.     Right Ear: Tympanic membrane normal.     Left Ear: Tympanic membrane normal.     Nose: Congestion and rhinorrhea present.     Mouth/Throat:     Mouth: Mucous membranes are moist.     Pharynx:  Posterior oropharyngeal erythema present.  Eyes:     Pupils: Pupils are equal, round, and reactive to light.  Neck:     Vascular: No carotid bruit or JVD.  Cardiovascular:     Rate and Rhythm: Normal rate and regular rhythm.     Heart sounds: Normal heart sounds.  Pulmonary:     Effort: Pulmonary effort is normal. No respiratory distress.     Breath sounds: Normal breath sounds. No wheezing or rales.  Chest:     Chest wall: No tenderness.  Abdominal:     General: Bowel sounds are normal. There is no distension or abdominal bruit.     Palpations: Abdomen is soft. There is no hepatomegaly, splenomegaly, mass or pulsatile mass.     Tenderness: There is no abdominal tenderness.  Musculoskeletal:        General: Normal range of motion.     Cervical back: Normal range of motion and neck supple.  Lymphadenopathy:     Cervical: No cervical adenopathy.  Skin:    General: Skin is warm and dry.  Neurological:     Mental Status: She is alert and oriented to person, place, and time.     Deep Tendon Reflexes: Reflexes are normal and symmetric.  Psychiatric:        Behavior: Behavior normal.        Thought Content: Thought content normal.        Judgment: Judgment normal.  BP (!) 154/92   Pulse 79   Temp (!) 97.3 F (36.3 C) (Temporal)   Resp 20   Ht 5\' 2"  (1.575 m)   Wt 266 lb (120.7 kg)   LMP 05/31/2014   SpO2 96%   BMI 48.65 kg/m      Assessment & Plan:   Hayley Crawford in today with chief complaint of Cough, Nasal Congestion, and Back Pain (Started this past Sunday)   1. Nasal congestion - Veritor Flu A/B Waived - Novel Coronavirus, NAA ([facility])  2. Bronchitis 1. Take meds as prescribed 2. Use a cool mist humidifier especially during the winter months and when heat has been humid. 3. Use saline nose sprays frequently 4. Saline irrigations of the nose can be very helpful if done frequently.  * 4X daily for 1 week*  * Use of a nettie pot can be helpful with  this. Follow directions with this* 5. Drink plenty of fluids 6. Keep thermostat turn down low 7.For any cough or congestion- promethazine dm 8. For fever or aces or pains- take tylenol or ibuprofen appropriate for age and weight.  * for fevers greater than 101 orally you may alternate ibuprofen and tylenol every  3 hours.    Meds ordered this encounter  Medications   azithromycin (ZITHROMAX Z-PAK) 250 MG tablet    Sig: As directed    Dispense:  6 tablet    Refill:  0    Order Specific Question:   Supervising Provider    Answer:   Arville Care A [[id number]]   promethazine-dextromethorphan (PROMETHAZINE-DM) 6.25-15 MG/5ML syrup    Sig: Take 5 mLs by mouth 4 (four) times daily as needed for cough.    Dispense:  118 mL    Refill:  0    Order Specific Question:   Supervising Provider    Answer:   Arville Care A [[id number]]   methylPREDNISolone acetate (DEPO-MEDROL) injection 80 mg      The above assessment and management plan was discussed with the patient. The patient verbalized understanding of and has agreed to the management plan. Patient is aware to call the clinic if symptoms persist or worsen. Patient is aware when to return to the clinic for a follow-up visit. Patient educated on when it is appropriate to go to the emergency department.   Mary-Margaret Daphine Deutscher, FNP

## 2023-04-15 NOTE — Patient Instructions (Signed)
1. Take meds as prescribed 2. Use a cool mist humidifier especially during the winter months and when heat has been humid. 3. Use saline nose sprays frequently 4. Saline irrigations of the nose can be very helpful if done frequently.  * 4X daily for 1 week*  * Use of a nettie pot can be helpful with this. Follow directions with this* 5. Drink plenty of fluids 6. Keep thermostat turn down low 7.For any cough or congestion- promethazine dm 8. For fever or aces or pains- take tylenol or ibuprofen appropriate for age and weight.  * for fevers greater than 101 orally you may alternate ibuprofen and tylenol every  3 hours.

## 2023-04-16 LAB — NOVEL CORONAVIRUS, NAA: SARS-CoV-2, NAA: NOT DETECTED

## 2023-05-08 ENCOUNTER — Other Ambulatory Visit (HOSPITAL_COMMUNITY): Payer: Self-pay | Admitting: Family

## 2023-05-08 DIAGNOSIS — Z1231 Encounter for screening mammogram for malignant neoplasm of breast: Secondary | ICD-10-CM

## 2023-05-08 DIAGNOSIS — G4733 Obstructive sleep apnea (adult) (pediatric): Secondary | ICD-10-CM | POA: Diagnosis not present

## 2023-05-15 ENCOUNTER — Ambulatory Visit: Payer: BC Managed Care – PPO | Admitting: Family

## 2023-05-15 ENCOUNTER — Encounter: Payer: Self-pay | Admitting: Family

## 2023-05-15 VITALS — BP 112/75 | HR 85 | Temp 97.9°F | Ht 62.0 in | Wt 255.4 lb

## 2023-05-15 DIAGNOSIS — Z6841 Body Mass Index (BMI) 40.0 and over, adult: Secondary | ICD-10-CM

## 2023-05-15 DIAGNOSIS — G4733 Obstructive sleep apnea (adult) (pediatric): Secondary | ICD-10-CM

## 2023-05-15 DIAGNOSIS — Z713 Dietary counseling and surveillance: Secondary | ICD-10-CM

## 2023-05-15 DIAGNOSIS — E785 Hyperlipidemia, unspecified: Secondary | ICD-10-CM

## 2023-05-15 DIAGNOSIS — I10 Essential (primary) hypertension: Secondary | ICD-10-CM | POA: Diagnosis not present

## 2023-05-15 MED ORDER — WEGOVY 1 MG/0.5ML ~~LOC~~ SOAJ: 1.0000 mg | SUBCUTANEOUS | 2 refills | Status: DC

## 2023-05-15 NOTE — Progress Notes (Signed)
 Subjective:    Patient ID: Hayley Crawford, female    DOB: 1965-08-28, 58 y.o.   MRN: [id number]  Chief Complaint  Patient presents with   Follow-up   Pt presents to the office today to follow up on Wegovy . She was started 0.25 mg 04/04/23 and has increased to 0.5 mg. She has lost 19 lbs.      05/15/2023   11:03 AM 04/15/2023    8:54 AM 03/14/2023   11:26 AM  Last 3 Weights  Weight (lbs) 255 lb 6.4 oz 266 lb 274 lb 3.2 oz  Weight (kg) 115.849 kg 120.657 kg 124.376 kg    She has OSA and using her CPAP nightly.  Hypertension This is a chronic problem. The current episode started more than 1 year ago. The problem has been resolved since onset. The problem is controlled. Associated symptoms include malaise/fatigue. Pertinent negatives include no peripheral edema or shortness of breath. Risk factors for coronary artery disease include obesity.  Hyperlipidemia This is a chronic problem. The current episode started more than 1 year ago. Exacerbating diseases include obesity. Pertinent negatives include no shortness of breath. Current antihyperlipidemic treatment includes diet change. The current treatment provides moderate improvement of lipids. Risk factors for coronary artery disease include hypertension, a sedentary lifestyle and post-menopausal.      Review of Systems  Constitutional:  Positive for malaise/fatigue.  Respiratory:  Negative for shortness of breath.   All other systems reviewed and are negative.   Social History   Socioeconomic History   Marital status: Single    Spouse name: Not on file   Number of children: Not on file   Years of education: Not on file   Highest education level: Associate degree: occupational, scientist, product/process development, or vocational program  Occupational History   Not on file  Tobacco Use   Smoking status: Former    Current packs/day: 0.00    Types: Cigarettes    Quit date: 01/09/2018    Years since quitting: 5.3   Smokeless tobacco: Never  Vaping Use    Vaping status: Never Used  Substance and Sexual Activity   Alcohol use: Yes   Drug use: No   Sexual activity: Never  Other Topics Concern   Not on file  Social History Narrative   Not on file   Social Drivers of Health   Financial Resource Strain: High Risk (01/17/2023)   Overall Financial Resource Strain (CARDIA)    Difficulty of Paying Living Expenses: Hard  Food Insecurity: Food Insecurity Present (01/17/2023)   Hunger Vital Sign    Worried About Running Out of Food in the Last Year: Sometimes true    Ran Out of Food in the Last Year: Sometimes true  Transportation Needs: Patient Declined (01/17/2023)   PRAPARE - Transportation    Lack of Transportation (Medical): Patient declined    Lack of Transportation (Non-Medical): Patient declined  Physical Activity: Insufficiently Active (01/17/2023)   Exercise Vital Sign    Days of Exercise per Week: 1 day    Minutes of Exercise per Session: 10 min  Stress: No Stress Concern Present (01/17/2023)   [name] of Occupational Health - Occupational Stress Questionnaire    Feeling of Stress : Not at all  Social Connections: Moderately Integrated (01/17/2023)   Social Connection and Isolation Panel [NHANES]    Frequency of Communication with Friends and Family: More than three times a week    Frequency of Social Gatherings with Friends and Family: Three times a week  Attends Religious Services: More than 4 times per year    Active Member of Clubs or Organizations: Yes    Attends Banker Meetings: More than 4 times per year    Marital Status: Divorced   Family History  Problem Relation Age of Onset   Diabetes Other    Cancer Mother        lung   Brain cancer Mother        tumor        Objective:   Physical Exam Vitals reviewed.  Constitutional:      General: She is not in acute distress.    Appearance: She is well-developed. She is obese.  HENT:     Head: Normocephalic and atraumatic.     Right Ear:  Tympanic membrane normal.     Left Ear: Tympanic membrane normal.  Eyes:     Pupils: Pupils are equal, round, and reactive to light.  Neck:     Thyroid : No thyromegaly.  Cardiovascular:     Rate and Rhythm: Normal rate and regular rhythm.     Heart sounds: Normal heart sounds. No murmur heard. Pulmonary:     Effort: Pulmonary effort is normal. No respiratory distress.     Breath sounds: Normal breath sounds. No wheezing.  Abdominal:     General: Bowel sounds are normal. There is no distension.     Palpations: Abdomen is soft.     Tenderness: There is no abdominal tenderness.  Musculoskeletal:        General: No tenderness. Normal range of motion.     Cervical back: Normal range of motion and neck supple.  Skin:    General: Skin is warm and dry.  Neurological:     Mental Status: She is alert and oriented to person, place, and time.     Cranial Nerves: No cranial nerve deficit.     Deep Tendon Reflexes: Reflexes are normal and symmetric.  Psychiatric:        Behavior: Behavior normal.        Thought Content: Thought content normal.        Judgment: Judgment normal.       BP 112/75   Pulse 85   Temp 97.9 F (36.6 C) (Temporal)   Ht 5' 2 (1.575 m)   Wt 255 lb 6.4 oz (115.8 kg)   LMP 05/31/2014   SpO2 98%   BMI 46.71 kg/m      Assessment & Plan:  Hayley Crawford comes in today with chief complaint of Follow-up   Diagnosis and orders addressed:  1. Hyperlipidemia, unspecified hyperlipidemia type (Primary) - Semaglutide -Weight Management (WEGOVY ) 1 MG/0.5ML SOAJ; Inject 1 mg into the skin once a week.  Dispense: 2 mL; Refill: 2  2. Primary hypertension - Semaglutide -Weight Management (WEGOVY ) 1 MG/0.5ML SOAJ; Inject 1 mg into the skin once a week.  Dispense: 2 mL; Refill: 2  3. Morbid obesity with BMI of 40.0-44.9, adult (HCC) - Semaglutide -Weight Management (WEGOVY ) 1 MG/0.5ML SOAJ; Inject 1 mg into the skin once a week.  Dispense: 2 mL; Refill: 2  4. OSA  (obstructive sleep apnea) - Semaglutide -Weight Management (WEGOVY ) 1 MG/0.5ML SOAJ; Inject 1 mg into the skin once a week.  Dispense: 2 mL; Refill: 2  5. Weight loss counseling, encounter for - Semaglutide -Weight Management (WEGOVY ) 1 MG/0.5ML SOAJ; Inject 1 mg into the skin once a week.  Dispense: 2 mL; Refill: 2   Will increase Wegovy  to 1 mg from 0.5 mg  Continue  current medications  Keep follow up with specialists  Health Maintenance reviewed Diet and exercise encouraged  Return in about 2 months (around 07/13/2023), or if symptoms worsen or fail to improve.    Bari Learn, FNP

## 2023-05-15 NOTE — Patient Instructions (Signed)
Calorie Counting for Weight Loss Calories are units of energy. Your body needs a certain number of calories from food to keep going throughout the day. When you eat or drink more calories than your body needs, your body stores the extra calories mostly as fat. When you eat or drink fewer calories than your body needs, your body burns fat to get the energy it needs. Calorie counting means keeping track of how many calories you eat and drink each day. Calorie counting can be helpful if you need to lose weight. If you eat fewer calories than your body needs, you should lose weight. Ask your health care provider what a healthy weight is for you. For calorie counting to work, you will need to eat the right number of calories each day to lose a healthy amount of weight per week. A dietitian can help you figure out how many calories you need in a day and will suggest ways to reach your calorie goal. A healthy amount of weight to lose each week is usually 1-2 lb (0.5-0.9 kg). This usually means that your daily calorie intake should be reduced by 500-750 calories. Eating 1,200-1,500 calories a day can help most women lose weight. Eating 1,500-1,800 calories a day can help most men lose weight. What do I need to know about calorie counting? Work with your health care provider or dietitian to determine how many calories you should get each day. To meet your daily calorie goal, you will need to: Find out how many calories are in each food that you would like to eat. Try to do this before you eat. Decide how much of the food you plan to eat. Keep a food log. Do this by writing down what you ate and how many calories it had. To successfully lose weight, it is important to balance calorie counting with a healthy lifestyle that includes regular activity. Where do I find calorie information?  The number of calories in a food can be found on a Nutrition Facts label. If a food does not have a Nutrition Facts label, try  to look up the calories online or ask your dietitian for help. Remember that calories are listed per serving. If you choose to have more than one serving of a food, you will have to multiply the calories per serving by the number of servings you plan to eat. For example, the label on a package of bread might say that a serving size is 1 slice and that there are 90 calories in a serving. If you eat 1 slice, you will have eaten 90 calories. If you eat 2 slices, you will have eaten 180 calories. How do I keep a food log? After each time that you eat, record the following in your food log as soon as possible: What you ate. Be sure to include toppings, sauces, and other extras on the food. How much you ate. This can be measured in cups, ounces, or number of items. How many calories were in each food and drink. The total number of calories in the food you ate. Keep your food log near you, such as in a pocket-sized notebook or on an app or website on your mobile phone. Some programs will calculate calories for you and show you how many calories you have left to meet your daily goal. What are some portion-control tips? Know how many calories are in a serving. This will help you know how many servings you can have of a certain   food. Use a measuring cup to measure serving sizes. You could also try weighing out portions on a kitchen scale. With time, you will be able to estimate serving sizes for some foods. Take time to put servings of different foods on your favorite plates or in your favorite bowls and cups so you know what a serving looks like. Try not to eat straight from a food's packaging, such as from a bag or box. Eating straight from the package makes it hard to see how much you are eating and can lead to overeating. Put the amount you would like to eat in a cup or on a plate to make sure you are eating the right portion. Use smaller plates, glasses, and bowls for smaller portions and to prevent  overeating. Try not to multitask. For example, avoid watching TV or using your computer while eating. If it is time to eat, sit down at a table and enjoy your food. This will help you recognize when you are full. It will also help you be more mindful of what and how much you are eating. What are tips for following this plan? Reading food labels Check the calorie count compared with the serving size. The serving size may be smaller than what you are used to eating. Check the source of the calories. Try to choose foods that are high in protein, fiber, and vitamins, and low in saturated fat, trans fat, and sodium. Shopping Read nutrition labels while you shop. This will help you make healthy decisions about which foods to buy. Pay attention to nutrition labels for low-fat or fat-free foods. These foods sometimes have the same number of calories or more calories than the full-fat versions. They also often have added sugar, starch, or salt to make up for flavor that was removed with the fat. Make a grocery list of lower-calorie foods and stick to it. Cooking Try to cook your favorite foods in a healthier way. For example, try baking instead of frying. Use low-fat dairy products. Meal planning Use more fruits and vegetables. One-half of your plate should be fruits and vegetables. Include lean proteins, such as chicken, turkey, and fish. Lifestyle Each week, aim to do one of the following: 150 minutes of moderate exercise, such as walking. 75 minutes of vigorous exercise, such as running. General information Know how many calories are in the foods you eat most often. This will help you calculate calorie counts faster. Find a way of tracking calories that works for you. Get creative. Try different apps or programs if writing down calories does not work for you. What foods should I eat?  Eat nutritious foods. It is better to have a nutritious, high-calorie food, such as an avocado, than a food with  few nutrients, such as a bag of potato chips. Use your calories on foods and drinks that will fill you up and will not leave you hungry soon after eating. Examples of foods that fill you up are nuts and nut butters, vegetables, lean proteins, and high-fiber foods such as whole grains. High-fiber foods are foods with more than 5 g of fiber per serving. Pay attention to calories in drinks. Low-calorie drinks include water and unsweetened drinks. The items listed above may not be a complete list of foods and beverages you can eat. Contact a dietitian for more information. What foods should I limit? Limit foods or drinks that are not good sources of vitamins, minerals, or protein or that are high in unhealthy fats. These   include: Candy. Other sweets. Sodas, specialty coffee drinks, alcohol, and juice. The items listed above may not be a complete list of foods and beverages you should avoid. Contact a dietitian for more information. How do I count calories when eating out? Pay attention to portions. Often, portions are much larger when eating out. Try these tips to keep portions smaller: Consider sharing a meal instead of getting your own. If you get your own meal, eat only half of it. Before you start eating, ask for a container and put half of your meal into it. When available, consider ordering smaller portions from the menu instead of full portions. Pay attention to your food and drink choices. Knowing the way food is cooked and what is included with the meal can help you eat fewer calories. If calories are listed on the menu, choose the lower-calorie options. Choose dishes that include vegetables, fruits, whole grains, low-fat dairy products, and lean proteins. Choose items that are boiled, broiled, grilled, or steamed. Avoid items that are buttered, battered, fried, or served with cream sauce. Items labeled as crispy are usually fried, unless stated otherwise. Choose water, low-fat milk,  unsweetened iced tea, or other drinks without added sugar. If you want an alcoholic beverage, choose a lower-calorie option, such as a glass of wine or light beer. Ask for dressings, sauces, and syrups on the side. These are usually high in calories, so you should limit the amount you eat. If you want a salad, choose a garden salad and ask for grilled meats. Avoid extra toppings such as bacon, cheese, or fried items. Ask for the dressing on the side, or ask for olive oil and vinegar or lemon to use as dressing. Estimate how many servings of a food you are given. Knowing serving sizes will help you be aware of how much food you are eating at restaurants. Where to find more information Centers for Disease Control and Prevention: www.cdc.gov U.[place]. Department of Agriculture: myplate.gov Summary Calorie counting means keeping track of how many calories you eat and drink each day. If you eat fewer calories than your body needs, you should lose weight. A healthy amount of weight to lose per week is usually 1-2 lb (0.5-0.9 kg). This usually means reducing your daily calorie intake by 500-750 calories. The number of calories in a food can be found on a Nutrition Facts label. If a food does not have a Nutrition Facts label, try to look up the calories online or ask your dietitian for help. Use smaller plates, glasses, and bowls for smaller portions and to prevent overeating. Use your calories on foods and drinks that will fill you up and not leave you hungry shortly after a meal. This information is not intended to replace advice given to you by your health care provider. Make sure you discuss any questions you have with your health care provider. Document Revised: 06/03/2019 Document Reviewed: 06/03/2019 Elsevier Patient Education  2023 Elsevier Inc.  

## 2023-06-03 ENCOUNTER — Other Ambulatory Visit: Payer: Self-pay | Admitting: *Deleted

## 2023-06-03 DIAGNOSIS — Z122 Encounter for screening for malignant neoplasm of respiratory organs: Secondary | ICD-10-CM

## 2023-06-03 DIAGNOSIS — Z87891 Personal history of nicotine dependence: Secondary | ICD-10-CM

## 2023-06-12 ENCOUNTER — Ambulatory Visit (INDEPENDENT_AMBULATORY_CARE_PROVIDER_SITE_OTHER): Payer: BC Managed Care – PPO | Admitting: Acute Care

## 2023-06-12 DIAGNOSIS — Z87891 Personal history of nicotine dependence: Secondary | ICD-10-CM | POA: Diagnosis not present

## 2023-06-12 NOTE — Progress Notes (Addendum)
 Provider Attestation I agree with the documentation of the Shared Decision Making visit,  smoking cessation counseling if appropriate, and verification or eligibility for lung cancer screening as documented by the RN Nurse Navigator.   Raejean Bullock, MSN, AGACNP-BC [facility] See Amion for personal pager PCCM on call pager [phone]    Virtual Visit via Telephone Note  I connected with Hayley Crawford on 06/12/23 at 11:00 AM EST by telephone and verified that I am speaking with the correct person using two identifiers.  Location: Patient: Hayley Crawford Provider: Alyse Bach, RN   I discussed the limitations, risks, security and privacy concerns of performing an evaluation and management service by telephone and the availability of in person appointments. I also discussed with the patient that there may be a patient responsible charge related to this service. The patient expressed understanding and agreed to proceed.   Shared Decision Making Visit Lung Cancer Screening Program [id number])   Eligibility: Age 57 y.o. Pack Years Smoking History Calculation 61 (# packs/per year x # years smoked) Recent History of coughing up blood  no Unexplained weight loss? no ( >Than 15 pounds within the last 6 months ) Prior History Lung / other cancer no (Diagnosis within the last 5 years already requiring surveillance chest CT Scans). Smoking Status Former Smoker Former Smokers: Years since quit: 4 years  Quit Date: 2021  Visit Components: Discussion included one or more decision making aids. yes Discussion included risk/benefits of screening. yes Discussion included potential follow up diagnostic testing for abnormal scans. yes Discussion included meaning and risk of over diagnosis. yes Discussion included meaning and risk of False Positives. yes Discussion included meaning of total radiation exposure. yes  Counseling Included: Importance of adherence to  annual lung cancer LDCT screening. yes Impact of comorbidities on ability to participate in the program. yes Ability and willingness to under diagnostic treatment. yes  Smoking Cessation Counseling: Current Smokers:  Discussed importance of smoking cessation. yes Information about tobacco cessation classes and interventions provided to patient. yes Patient provided with "ticket" for LDCT Scan. no Symptomatic Patient. no  Counseling(Intermediate counseling: > three minutes) 99406 Diagnosis Code: Tobacco Use Z72.0 Asymptomatic Patient yes  Counseling (Intermediate counseling: > three minutes counseling) V7846 Former Smokers:  Discussed the importance of maintaining cigarette abstinence. yes Diagnosis Code: Personal History of Nicotine  Dependence. N62.952 Information about tobacco cessation classes and interventions provided to patient. Yes Patient provided with "ticket" for LDCT Scan. no Written Order for Lung Cancer Screening with LDCT placed in Epic. Yes (CT Chest Lung Cancer Screening Low Dose W/O CM) [id number] Z12.2-Screening of respiratory organs Z87.891-Personal history of nicotine  dependence   Alyse Bach, RN

## 2023-06-12 NOTE — Patient Instructions (Signed)

## 2023-06-19 ENCOUNTER — Ambulatory Visit (HOSPITAL_COMMUNITY)
Admission: RE | Admit: 2023-06-19 | Discharge: 2023-06-19 | Disposition: A | Discharge status: Home / Self Care | Payer: BC Managed Care – PPO | Source: Ambulatory Visit | Attending: Acute Care | Admitting: Acute Care

## 2023-06-19 ENCOUNTER — Ambulatory Visit (HOSPITAL_COMMUNITY): Payer: BC Managed Care – PPO

## 2023-06-19 ENCOUNTER — Encounter: Payer: BC Managed Care – PPO | Admitting: Family

## 2023-06-19 DIAGNOSIS — Z122 Encounter for screening for malignant neoplasm of respiratory organs: Secondary | ICD-10-CM | POA: Diagnosis not present

## 2023-06-19 DIAGNOSIS — Z87891 Personal history of nicotine dependence: Secondary | ICD-10-CM | POA: Insufficient documentation

## 2023-07-02 ENCOUNTER — Ambulatory Visit (HOSPITAL_COMMUNITY)
Admission: RE | Admit: 2023-07-02 | Discharge: 2023-07-02 | Disposition: A | Discharge status: Home / Self Care | Payer: BC Managed Care – PPO | Source: Ambulatory Visit | Attending: Family | Admitting: Family

## 2023-07-02 DIAGNOSIS — Z1231 Encounter for screening mammogram for malignant neoplasm of breast: Secondary | ICD-10-CM | POA: Insufficient documentation

## 2023-07-03 ENCOUNTER — Ambulatory Visit: Payer: BC Managed Care – PPO | Admitting: Family

## 2023-07-03 ENCOUNTER — Encounter: Payer: Self-pay | Admitting: Family

## 2023-07-03 VITALS — BP 123/78 | HR 67 | Temp 97.8°F | Ht 62.0 in | Wt 249.0 lb

## 2023-07-03 DIAGNOSIS — Z1211 Encounter for screening for malignant neoplasm of colon: Secondary | ICD-10-CM

## 2023-07-03 DIAGNOSIS — F411 Generalized anxiety disorder: Secondary | ICD-10-CM

## 2023-07-03 DIAGNOSIS — Z87898 Personal history of other specified conditions: Secondary | ICD-10-CM

## 2023-07-03 DIAGNOSIS — E785 Hyperlipidemia, unspecified: Secondary | ICD-10-CM

## 2023-07-03 DIAGNOSIS — J449 Chronic obstructive pulmonary disease, unspecified: Secondary | ICD-10-CM

## 2023-07-03 DIAGNOSIS — Z713 Dietary counseling and surveillance: Secondary | ICD-10-CM

## 2023-07-03 DIAGNOSIS — E559 Vitamin D deficiency, unspecified: Secondary | ICD-10-CM

## 2023-07-03 DIAGNOSIS — I10 Essential (primary) hypertension: Secondary | ICD-10-CM | POA: Diagnosis not present

## 2023-07-03 DIAGNOSIS — Z0000 Encounter for general adult medical examination without abnormal findings: Secondary | ICD-10-CM

## 2023-07-03 DIAGNOSIS — M7661 Achilles tendinitis, right leg: Secondary | ICD-10-CM

## 2023-07-03 DIAGNOSIS — M5440 Lumbago with sciatica, unspecified side: Secondary | ICD-10-CM | POA: Diagnosis not present

## 2023-07-03 DIAGNOSIS — K219 Gastro-esophageal reflux disease without esophagitis: Secondary | ICD-10-CM

## 2023-07-03 DIAGNOSIS — D509 Iron deficiency anemia, unspecified: Secondary | ICD-10-CM

## 2023-07-03 DIAGNOSIS — Z0001 Encounter for general adult medical examination with abnormal findings: Secondary | ICD-10-CM | POA: Diagnosis not present

## 2023-07-03 DIAGNOSIS — G4733 Obstructive sleep apnea (adult) (pediatric): Secondary | ICD-10-CM

## 2023-07-03 DIAGNOSIS — J441 Chronic obstructive pulmonary disease with (acute) exacerbation: Secondary | ICD-10-CM

## 2023-07-03 DIAGNOSIS — Z87891 Personal history of nicotine dependence: Secondary | ICD-10-CM

## 2023-07-03 DIAGNOSIS — E538 Deficiency of other specified B group vitamins: Secondary | ICD-10-CM

## 2023-07-03 DIAGNOSIS — Z6841 Body Mass Index (BMI) 40.0 and over, adult: Secondary | ICD-10-CM

## 2023-07-03 MED ORDER — PROMETHAZINE HCL 25 MG PO TABS: ORAL_TABLET | ORAL | 2 refills | Status: DC

## 2023-07-03 MED ORDER — DICLOFENAC SODIUM 75 MG PO TBEC: 75.0000 mg | DELAYED_RELEASE_TABLET | Freq: Two times a day (BID) | ORAL | 2 refills | Status: DC

## 2023-07-03 MED ORDER — BUSPIRONE HCL 10 MG PO TABS: 10.0000 mg | ORAL_TABLET | Freq: Three times a day (TID) | ORAL | 1 refills | Status: DC | PRN

## 2023-07-03 MED ORDER — CYCLOBENZAPRINE HCL 10 MG PO TABS: 10.0000 mg | ORAL_TABLET | Freq: Three times a day (TID) | ORAL | 1 refills | Status: AC | PRN

## 2023-07-03 MED ORDER — NITROGLYCERIN 0.4 MG SL SUBL: 0.4000 mg | SUBLINGUAL_TABLET | SUBLINGUAL | 0 refills | Status: AC | PRN

## 2023-07-03 MED ORDER — HYDROCHLOROTHIAZIDE 25 MG PO TABS: 25.0000 mg | ORAL_TABLET | Freq: Every day | ORAL | 1 refills | Status: DC

## 2023-07-03 MED ORDER — ONDANSETRON HCL 4 MG PO TABS: 4.0000 mg | ORAL_TABLET | Freq: Three times a day (TID) | ORAL | 2 refills | Status: DC | PRN

## 2023-07-03 MED ORDER — WEGOVY 1 MG/0.5ML ~~LOC~~ SOAJ: 1.0000 mg | SUBCUTANEOUS | 2 refills | Status: DC

## 2023-07-03 MED ORDER — ALBUTEROL SULFATE HFA 108 (90 BASE) MCG/ACT IN AERS: 2.0000 | INHALATION_SPRAY | Freq: Four times a day (QID) | RESPIRATORY_TRACT | 3 refills | Status: AC | PRN

## 2023-07-03 NOTE — Progress Notes (Signed)
 Subjective:    Patient ID: Hayley Crawford, female    DOB: 09/03/1965, 58 y.o.   MRN: [id number]  Chief Complaint  Patient presents with   Annual Exam    DECLINE PAP   Pt presents to the office today for CPE and follow up on Wegovy. She is currently taking 1 mg.  She has lost 25 lbs.      07/03/2023    2:30 PM 05/15/2023   11:03 AM 04/15/2023    8:54 AM  Last 3 Weights  Weight (lbs) 249 lb 255 lb 6.4 oz 266 lb  Weight (kg) 112.946 kg 115.849 kg 120.657 kg    She has OSA and using her CPAP nightly.   She has COPD, she quit smoking in 2020. Denies any SOB at this time.  Hypertension This is a chronic problem. The current episode started more than 1 year ago. The problem has been resolved since onset. The problem is controlled. Associated symptoms include anxiety and malaise/fatigue. Pertinent negatives include no peripheral edema or shortness of breath. Risk factors for coronary artery disease include obesity.  Hyperlipidemia This is a chronic problem. The current episode started more than 1 year ago. Exacerbating diseases include obesity. Pertinent negatives include no shortness of breath. Current antihyperlipidemic treatment includes diet change. The current treatment provides no improvement of lipids. Risk factors for coronary artery disease include hypertension, a sedentary lifestyle, post-menopausal and obesity.  Gastroesophageal Reflux  Anemia Presents for follow-up visit. Symptoms include malaise/fatigue.  Anxiety Presents for follow-up visit. Symptoms include excessive worry, nervous/anxious behavior and restlessness. Patient reports no shortness of breath. Symptoms occur occasionally. The severity of symptoms is mild.   Her past medical history is significant for anemia.      Review of Systems  Constitutional:  Positive for malaise/fatigue.  Respiratory:  Negative for shortness of breath.   Psychiatric/Behavioral:  The patient is nervous/anxious.   All other systems  reviewed and are negative.   Social History   Socioeconomic History   Marital status: Single    Spouse name: Not on file   Number of children: Not on file   Years of education: Not on file   Highest education level: Associate degree: occupational, Scientist, product/process development, or vocational program  Occupational History   Not on file  Tobacco Use   Smoking status: Former    Current packs/day: 0.00    Average packs/day: 2.0 packs/day for 38.0 years (76.0 ttl pk-yrs)    Types: Cigarettes    Start date: 78    Quit date: 2021    Years since quitting: 4.1   Smokeless tobacco: Never  Vaping Use   Vaping status: Never Used  Substance and Sexual Activity   Alcohol use: Yes   Drug use: No   Sexual activity: Never  Other Topics Concern   Not on file  Social History Narrative   Not on file   Social Drivers of Health   Financial Resource Strain: High Risk (01/17/2023)   Overall Financial Resource Strain (CARDIA)    Difficulty of Paying Living Expenses: Hard  Food Insecurity: Food Insecurity Present (01/17/2023)   Hunger Vital Sign    Worried About Running Out of Food in the Last Year: Sometimes true    Ran Out of Food in the Last Year: Sometimes true  Transportation Needs: Patient Declined (01/17/2023)   PRAPARE - Transportation    Lack of Transportation (Medical): Patient declined    Lack of Transportation (Non-Medical): Patient declined  Physical Activity: Insufficiently Active (01/17/2023)  Exercise Vital Sign    Days of Exercise per Week: 1 day    Minutes of Exercise per Session: 10 min  Stress: No Stress Concern Present (01/17/2023)   [name] of Occupational Health - Occupational Stress Questionnaire    Feeling of Stress : Not at all  Social Connections: Moderately Integrated (01/17/2023)   Social Connection and Isolation Panel [NHANES]    Frequency of Communication with Friends and Family: More than three times a week    Frequency of Social Gatherings with Friends and Family:  Three times a week    Attends Religious Services: More than 4 times per year    Active Member of Clubs or Organizations: Yes    Attends Engineer, structural: More than 4 times per year    Marital Status: Divorced   Family History  Problem Relation Age of Onset   Diabetes Other    Cancer Mother        lung   Brain cancer Mother        tumor        Objective:   Physical Exam Vitals reviewed.  Constitutional:      General: She is not in acute distress.    Appearance: She is well-developed. She is obese.  HENT:     Head: Normocephalic and atraumatic.     Right Ear: Tympanic membrane normal.     Left Ear: Tympanic membrane normal.  Eyes:     Pupils: Pupils are equal, round, and reactive to light.  Neck:     Thyroid: No thyromegaly.  Cardiovascular:     Rate and Rhythm: Normal rate and regular rhythm.     Heart sounds: Normal heart sounds. No murmur heard. Pulmonary:     Effort: Pulmonary effort is normal. No respiratory distress.     Breath sounds: Normal breath sounds. No wheezing.  Abdominal:     General: Bowel sounds are normal. There is no distension.     Palpations: Abdomen is soft.     Tenderness: There is no abdominal tenderness.  Musculoskeletal:        General: No tenderness. Normal range of motion.     Cervical back: Normal range of motion and neck supple.  Skin:    General: Skin is warm and dry.  Neurological:     Mental Status: She is alert and oriented to person, place, and time.     Cranial Nerves: No cranial nerve deficit.     Deep Tendon Reflexes: Reflexes are normal and symmetric.  Psychiatric:        Behavior: Behavior normal.        Thought Content: Thought content normal.        Judgment: Judgment normal.       BP 123/78   Pulse 67   Temp 97.8 F (36.6 C) (Temporal)   Ht 5\' 2"  (1.575 m)   Wt 249 lb (112.9 kg)   LMP 05/31/2014   SpO2 97%   BMI 45.54 kg/m      Assessment & Plan:  CHRYSTEL BAREFIELD comes in today with chief  complaint of Annual Exam (DECLINE PAP)   Diagnosis and orders addressed:  1. Chronic obstructive pulmonary disease, unspecified COPD type (HCC) - CMP14+EGFR - albuterol (VENTOLIN HFA) 108 (90 Base) MCG/ACT inhaler; Inhale 2 puffs into the lungs every 6 (six) hours as needed for wheezing or shortness of breath.  Dispense: 1 each; Refill: 3  2. GAD (generalized anxiety disorder) - CMP14+EGFR - busPIRone (BUSPAR) 10  MG tablet; Take 1 tablet (10 mg total) by mouth 3 (three) times daily as needed.  Dispense: 90 tablet; Refill: 1  3. Low back pain with sciatica, sciatica laterality unspecified, unspecified back pain laterality, unspecified chronicity - CMP14+EGFR - cyclobenzaprine (FLEXERIL) 10 MG tablet; Take 1 tablet (10 mg total) by mouth 3 (three) times daily as needed for muscle spasms.  Dispense: 60 tablet; Refill: 1  4. Essential hypertension  - CMP14+EGFR - hydrochlorothiazide (HYDRODIURIL) 25 MG tablet; Take 1 tablet (25 mg total) by mouth daily. )  Dispense: 90 tablet; Refill: 1  5. History of chest pain - CMP14+EGFR - nitroGLYCERIN (NITROSTAT) 0.4 MG SL tablet; Place 1 tablet (0.4 mg total) under the tongue every 5 (five) minutes as needed for chest pain.  Dispense: 10 tablet; Refill: 0  6. Weight loss counseling, encounter for - CMP14+EGFR - ondansetron (ZOFRAN) 4 MG tablet; Take 1 tablet (4 mg total) by mouth every 8 (eight) hours as needed for nausea or vomiting.  Dispense: 30 tablet; Refill: 2  7. Gastroesophageal reflux disease without esophagitis - CMP14+EGFR - promethazine (PHENERGAN) 25 MG tablet; TAKE 1/2 TO 1 TABLETS BY MOUTH EVERY 8 HOURS AS NEEDED FOR NAUSE OR VOMITING  Dispense: 20 tablet; Refill: 2  8. Annual physical exam (Primary) - CBC with Differential/Platelet - CMP14+EGFR - Lipid panel  9. Hx of smoking - CMP14+EGFR  10. Hyperlipidemia, unspecified hyperlipidemia type - CMP14+EGFR  11. Primary hypertension - CMP14+EGFR  12. Iron deficiency  anemia, unspecified iron deficiency anemia type - CMP14+EGFR  13. Morbid obesity with BMI of 40.0-44.9, adult (HCC) - CMP14+EGFR  14. OSA (obstructive sleep apnea) - CMP14+EGFR  15. Vitamin B 12 deficiency - CMP14+EGFR - Vitamin B12  16. Vitamin D deficiency - CMP14+EGFR - VITAMIN D 25 Hydroxy (Vit-D Deficiency, Fractures)  17. Colon cancer screening - Cologuard  Continue Wegovy  1 mg Continue current medications  Keep follow up with specialists  Health Maintenance reviewed Diet and exercise encouraged  Return in about 3 months (around 09/30/2023), or if symptoms worsen or fail to improve.    Jannifer Rodney, FNP

## 2023-07-03 NOTE — Patient Instructions (Signed)

## 2023-07-04 ENCOUNTER — Other Ambulatory Visit: Payer: Self-pay | Admitting: Family

## 2023-07-04 LAB — CBC WITH DIFFERENTIAL/PLATELET
Basophils Absolute: 0 10*3/uL (ref 0.0–0.2)
Basos: 1 %
EOS (ABSOLUTE): 0.2 10*3/uL (ref 0.0–0.4)
Eos: 2 %
Hematocrit: 36.9 % (ref 34.0–46.6)
Hemoglobin: 12.6 g/dL (ref 11.1–15.9)
Immature Grans (Abs): 0 10*3/uL (ref 0.0–0.1)
Immature Granulocytes: 0 %
Lymphocytes Absolute: 2.2 10*3/uL (ref 0.7–3.1)
Lymphs: 25 %
MCH: 32.3 pg (ref 26.6–33.0)
MCHC: 34.1 g/dL (ref 31.5–35.7)
MCV: 95 fL (ref 79–97)
Monocytes Absolute: 0.5 10*3/uL (ref 0.1–0.9)
Monocytes: 6 %
Neutrophils Absolute: 5.6 10*3/uL (ref 1.4–7.0)
Neutrophils: 66 %
Platelets: 332 10*3/uL (ref 150–450)
RBC: 3.9 x10E6/uL (ref 3.77–5.28)
RDW: 13 % (ref 11.7–15.4)
WBC: 8.5 10*3/uL (ref 3.4–10.8)

## 2023-07-04 LAB — CMP14+EGFR
ALT: 27 [IU]/L (ref 0–32)
AST: 21 [IU]/L (ref 0–40)
Albumin: 4.1 g/dL (ref 3.8–4.9)
Alkaline Phosphatase: 100 [IU]/L (ref 44–121)
BUN/Creatinine Ratio: 15 (ref 12–28)
BUN: 14 mg/dL (ref 8–27)
Bilirubin Total: 0.4 mg/dL (ref 0.0–1.2)
CO2: 23 mmol/L (ref 20–29)
Calcium: 9.6 mg/dL (ref 8.7–10.3)
Chloride: 98 mmol/L (ref 96–106)
Creatinine, Ser: 0.92 mg/dL (ref 0.57–1.00)
Globulin, Total: 3 g/dL (ref 1.5–4.5)
Glucose: 75 mg/dL (ref 70–99)
Potassium: 4 mmol/L (ref 3.5–5.2)
Sodium: 136 mmol/L (ref 134–144)
Total Protein: 7.1 g/dL (ref 6.0–8.5)
eGFR: 71 mL/min/{1.73_m2} (ref >59)

## 2023-07-04 LAB — LIPID PANEL
Chol/HDL Ratio: 5.4 {ratio} — ABNORMAL HIGH (ref 0.0–4.4)
Cholesterol, Total: 239 mg/dL — ABNORMAL HIGH (ref 100–199)
HDL: 44 mg/dL (ref >39)
LDL Chol Calc (NIH): 158 mg/dL — ABNORMAL HIGH (ref 0–99)
Triglycerides: 202 mg/dL — ABNORMAL HIGH (ref 0–149)
VLDL Cholesterol Cal: 37 mg/dL (ref 5–40)

## 2023-07-04 LAB — VITAMIN B12: Vitamin B-12: 305 pg/mL (ref 232–1245)

## 2023-07-04 LAB — VITAMIN D 25 HYDROXY (VIT D DEFICIENCY, FRACTURES): Vit D, 25-Hydroxy: 24.1 ng/mL — ABNORMAL LOW (ref 30.0–100.0)

## 2023-07-04 MED ORDER — VITAMIN D (ERGOCALCIFEROL) 1.25 MG (50000 UNIT) PO CAPS: 50000.0000 [IU] | ORAL_CAPSULE | ORAL | 3 refills | Status: AC

## 2023-07-04 NOTE — Telephone Encounter (Signed)
 Copied from CRM [id number]. Topic: General - Other >> Jul 04, 2023  4:13 PM Geneva B wrote: Reason for CRM: let patient no her results and she ok and she will pick her rx up

## 2023-07-08 ENCOUNTER — Telehealth: Payer: Self-pay | Admitting: *Deleted

## 2023-07-08 ENCOUNTER — Telehealth: Payer: Self-pay

## 2023-07-08 DIAGNOSIS — Z122 Encounter for screening for malignant neoplasm of respiratory organs: Secondary | ICD-10-CM

## 2023-07-08 DIAGNOSIS — R9389 Abnormal findings on diagnostic imaging of other specified body structures: Secondary | ICD-10-CM

## 2023-07-08 DIAGNOSIS — Z87891 Personal history of nicotine dependence: Secondary | ICD-10-CM

## 2023-07-08 NOTE — Telephone Encounter (Signed)
 Patient wants you to order the esophagram per the ct so she can get it done fast because she has already met her deductible and it starts over in April

## 2023-07-08 NOTE — Addendum Note (Signed)
 Addended by: Jannifer Rodney A on: 07/08/2023 03:57 PM   Modules accepted: Orders

## 2023-07-08 NOTE — Telephone Encounter (Signed)
 Called and spoke with patient regarding lung screening CT results. No suspicious lung nodules seen. An area of thickening in the esophagus. Pt denies any difficulty swallowing or throat clearing. I advised pt I have forwarded results to her PCP with notes regarding getting the esophagram scheduled. Pt verbalized understanding. Order placed for 1 yr f/u LDCT.

## 2023-07-08 NOTE — Telephone Encounter (Signed)
I have placed referral to GI

## 2023-07-08 NOTE — Telephone Encounter (Signed)
 Copied from CRM [id number]. Topic: General - Other >> Jul 08, 2023  1:33 PM Everette Rank wrote: Reason for CRM: CT CHEST LUNG CA SCREEN LOW DOSE W/O CM (Accession [id number]) (Order [id number]) 02/13-Patient Request to call office but no answer, on looking over results and after going over what she needs to do next she will like to get it done soon due to her dedutible is going to start over. Needs call back [phone]

## 2023-07-15 ENCOUNTER — Encounter (INDEPENDENT_AMBULATORY_CARE_PROVIDER_SITE_OTHER): Payer: Self-pay | Admitting: *Deleted

## 2023-07-16 DIAGNOSIS — Z1211 Encounter for screening for malignant neoplasm of colon: Secondary | ICD-10-CM | POA: Diagnosis not present

## 2023-07-21 LAB — COLOGUARD: COLOGUARD: NEGATIVE

## 2023-08-06 ENCOUNTER — Ambulatory Visit (INDEPENDENT_AMBULATORY_CARE_PROVIDER_SITE_OTHER): Admitting: Gastroenterology

## 2023-08-06 ENCOUNTER — Encounter (INDEPENDENT_AMBULATORY_CARE_PROVIDER_SITE_OTHER): Payer: Self-pay | Admitting: Gastroenterology

## 2023-08-06 VITALS — BP 113/77 | HR 70 | Temp 98.2°F | Ht 62.0 in | Wt 243.1 lb

## 2023-08-06 DIAGNOSIS — Z87891 Personal history of nicotine dependence: Secondary | ICD-10-CM

## 2023-08-06 DIAGNOSIS — R1310 Dysphagia, unspecified: Secondary | ICD-10-CM | POA: Diagnosis not present

## 2023-08-06 DIAGNOSIS — R933 Abnormal findings on diagnostic imaging of other parts of digestive tract: Secondary | ICD-10-CM | POA: Diagnosis not present

## 2023-08-06 DIAGNOSIS — R9389 Abnormal findings on diagnostic imaging of other specified body structures: Secondary | ICD-10-CM | POA: Insufficient documentation

## 2023-08-06 DIAGNOSIS — K5904 Chronic idiopathic constipation: Secondary | ICD-10-CM | POA: Insufficient documentation

## 2023-08-06 DIAGNOSIS — Z6841 Body Mass Index (BMI) 40.0 and over, adult: Secondary | ICD-10-CM | POA: Diagnosis not present

## 2023-08-06 DIAGNOSIS — K5900 Constipation, unspecified: Secondary | ICD-10-CM | POA: Diagnosis not present

## 2023-08-06 DIAGNOSIS — R1319 Other dysphagia: Secondary | ICD-10-CM | POA: Insufficient documentation

## 2023-08-06 MED ORDER — POLYETHYLENE GLYCOL 3350 17 G PO PACK: 17.0000 g | PACK | Freq: Two times a day (BID) | ORAL | 0 refills | Status: AC

## 2023-08-06 NOTE — Progress Notes (Signed)
 [name] , M.D. Gastroenterology & Hepatology [facility] [place] [facility] [address] [place], [place] [address] Primary Care Physician: Junie Spencer, FNP [address] [place] [address]  Chief Complaint:  Abnormal Ct with esophageal wall thickening , dysphagia , constipation   History of Present Illness: Hayley Crawford is a 58 y.o. female with OSA/COPD on CPAP, class III obesity on GLP-1, Chronic GERD  who presents for evaluation of Abnormal Ct with esophageal wall thickening , dysphagia , constipation   Patient had a CT chest done for lung cancer screening as she has a 36-pack-year smoking history quit 2021 which suggested esophagus thickening  Patient reports that she previously never had difficulty swallowing since last the CT finding as she has started to notice food slowing down in the upper chest mostly solid food.  Denies any regurgitation.  Patient reports chronic history of acid reflux which is improved with omeprazole as she has been taking for many years. The patient denies having any nausea, vomiting, fever, chills, hematochezia, melena, hematemesis, abdominal distention, abdominal pain, diarrhea, jaundice, pruritus or unintentional weight loss.  Last UUV:OZDG Cologuard test negative 07/16/2023  FHx: neg for any gastrointestinal/liver disease, no malignancies Social: Former smoker, quit in 2021 Surgical: Hysterectomy, cholecystectomy  Labs from 07/03/2023 normal liver enzymes hemoglobin 12.6 platelet 332  Past Medical History: Past Medical History:  Diagnosis Date   B12 deficiency    COPD (chronic obstructive pulmonary disease) (HCC)    Hyperlipidemia    Hypertension    Uterine fibroid     Past Surgical History: Past Surgical History:  Procedure Laterality Date   ABDOMINAL HYSTERECTOMY     Partial   bladder stretching     CHOLECYSTECTOMY     HEMORROIDECTOMY     TENDON REPAIR     right thumb   tumor removed       Family History: Family History  Problem Relation Age of Onset   Diabetes Other    Cancer Mother        lung   Brain cancer Mother        tumor    Social History: Social History   Tobacco Use  Smoking Status Former   Current packs/day: 0.00   Average packs/day: 2.0 packs/day for 38.0 years (76.0 ttl pk-yrs)   Types: Cigarettes   Start date: 23   Quit date: 2021   Years since quitting: 4.2  Smokeless Tobacco Never   Social History   Substance and Sexual Activity  Alcohol Use Yes   Social History   Substance and Sexual Activity  Drug Use No    Allergies: Allergies  Allergen Reactions   Sulfa Antibiotics Swelling    "think it caused my throat to swell"    Medications: Current Outpatient Medications  Medication Sig Dispense Refill   albuterol (VENTOLIN HFA) 108 (90 Base) MCG/ACT inhaler Inhale 2 puffs into the lungs every 6 (six) hours as needed for wheezing or shortness of breath. 1 each 3   azelastine (ASTELIN) 0.1 % nasal spray Place 1 spray into both nostrils 2 (two) times daily. Use in each nostril as directed 30 mL 0   busPIRone (BUSPAR) 10 MG tablet Take 1 tablet (10 mg total) by mouth 3 (three) times daily as needed. 90 tablet 1   cyclobenzaprine (FLEXERIL) 10 MG tablet Take 1 tablet (10 mg total) by mouth 3 (three) times daily as needed for muscle spasms. 60 tablet 1   diclofenac (VOLTAREN) 75 MG EC  tablet Take 1 tablet (75 mg total) by mouth 2 (two) times daily. 180 tablet 2   hydrochlorothiazide (HYDRODIURIL) 25 MG tablet Take 1 tablet (25 mg total) by mouth daily. ) 90 tablet 1   ipratropium (ATROVENT) 0.03 % nasal spray Place 2 sprays into both nostrils every 12 (twelve) hours. 30 mL 0   losartan (COZAAR) 100 MG tablet Take 1 tablet (100 mg total) by mouth daily. 90 tablet 3   nitroGLYCERIN (NITROSTAT) 0.4 MG SL tablet Place 1 tablet (0.4 mg total) under the tongue every 5 (five) minutes as needed for chest pain. 10 tablet 0   omeprazole  (PRILOSEC) 20 MG capsule Take 1 capsule (20 mg total) by mouth daily. 90 capsule 2   ondansetron (ZOFRAN) 4 MG tablet Take 1 tablet (4 mg total) by mouth every 8 (eight) hours as needed for nausea or vomiting. 30 tablet 2   polyethylene glycol (MIRALAX / GLYCOLAX) 17 g packet Take 17 g by mouth 2 (two) times daily. 180 packet 0   promethazine (PHENERGAN) 25 MG tablet TAKE 1/2 TO 1 TABLETS BY MOUTH EVERY 8 HOURS AS NEEDED FOR NAUSE OR VOMITING 20 tablet 2   Semaglutide-Weight Management (WEGOVY) 1 MG/0.5ML SOAJ Inject 1 mg into the skin once a week. 2 mL 2   Vitamin D, Ergocalciferol, (DRISDOL) 1.25 MG (50000 UNIT) CAPS capsule Take 1 capsule (50,000 Units total) by mouth every 7 (seven) days. 12 capsule 3   No current facility-administered medications for this visit.    Review of Systems: GENERAL: negative for malaise, night sweats HEENT: No changes in hearing or vision, no nose bleeds or other nasal problems. NECK: Negative for lumps, goiter, pain and significant neck swelling RESPIRATORY: Negative for cough, wheezing CARDIOVASCULAR: Negative for chest pain, leg swelling, palpitations, orthopnea GI: SEE HPI MUSCULOSKELETAL: Negative for joint pain or swelling, back pain, and muscle pain. SKIN: Negative for lesions, rash HEMATOLOGY Negative for prolonged bleeding, bruising easily, and swollen nodes. ENDOCRINE: Negative for cold or heat intolerance, polyuria, polydipsia and goiter. NEURO: negative for tremor, gait imbalance, syncope and seizures. The remainder of the review of systems is noncontributory.   Physical Exam: BP 113/77   Pulse 70   Temp 98.2 F (36.8 C)   Ht 5\' 2"  (1.575 m)   Wt 243 lb 1.6 oz (110.3 kg)   LMP 05/31/2014   BMI 44.46 kg/m  GENERAL: The patient is AO x3, in no acute distress. HEENT: Head is normocephalic and atraumatic. EOMI are intact. Mouth is well hydrated and without lesions. NECK: Supple. No masses LUNGS: Clear to auscultation. No presence of  rhonchi/wheezing/rales. Adequate chest expansion HEART: RRR, normal s1 and s2. ABDOMEN: Soft, nontender, no guarding, no peritoneal signs, and nondistended. BS +. No masses.  Imaging/Labs: as above     Latest Ref Rng & Units 07/03/2023    2:48 PM 12/17/2022   12:31 PM 12/21/2021    2:34 PM  CBC  WBC 3.4 - 10.8 x10E3/uL 8.5  9.0  7.3   Hemoglobin 11.1 - 15.9 g/dL 16.1  09.6  04.5   Hematocrit 34.0 - 46.6 % 36.9  39.4  37.9   Platelets 150 - 450 x10E3/uL 332  300  302    Lab Results  Component Value Date   IRON 65 01/01/2019   TIBC 364 01/01/2019   FERRITIN 73 01/01/2019    I personally reviewed and interpreted the available labs, imaging and endoscopic files.  Impression and Plan:  Hayley Crawford is a 58  y.o. female with OSA/COPD on CPAP, class III obesity on GLP-1, Chronic GERD  who presents for evaluation of Abnormal Ct with esophageal wall thickening , dysphagia , constipation   #Abnormal Ct with esophageal wall thickening  #Dysphagia  Patient had a CT chest done for lung cancer screening as she has a 36-pack-year smoking history quit 2021 which suggested esophagus thickening  Patient reports that she previously never had difficulty swallowing since last the CT finding as she has started to notice food slowing down in the upper chest mostly solid food.    This could be esophagitis , ring web or peptic stricture.  Patient does have risk factors with obesity history of heavy smoking and chronic GERD and without upper endoscopy unable to rule out malignancy  Will proceed with upper endoscopy  I thoroughly discussed with the patient the procedure, including the risks involved. Patient understands what the procedure involves including the benefits and any risks. Patient understands alternatives to the proposed procedure. Risks including (but not limited to) bleeding, tearing of the lining (perforation), rupture of adjacent organs, problems with heart and lung function, infection,  and medication reactions. A small percentage of complications may require surgery, hospitalization, repeat endoscopic procedure, and/or transfusion.  Patient understood and agreed.   #Constipation  Patient likely has medication induced constipation from [facility] which is expected She appears to have great response in terms of weight loss and recommend continuing Wegovy  Ensure adequate fluid intake: Aim for 8 glasses of water daily. Follow a high fiber diet: Include foods such as dates, prunes, pears, and kiwi. Take Miralax twice a day for the first week, then reduce to once daily thereafter.  #BMI 44     - walking at a brisk pace/biking at moderate intesity 2.5-5 hours per week     - use pedometer/step counter to track activity     - goal to lose 5-10% of initial body weight     - avoid suagry drinks and juices, use zero calorie beverages     - increase water intake     - eat a low carb diet with plenty of veggies and fruit     - Get sufficient sleep 7-8 hrs nightly     - maitain active lifestyle     - avoid alcohol     - recommend 2-3 cups Coffee daily     - Counsel on lowering cholesterol by having a diet rich in vegetables,          protein (avoid red meats) and good fats(fish, salmon).  All questions were answered.      [name], MD Gastroenterology and Hepatology [facility]   This chart has been completed using [facility] Dictation software, and while attempts have been made to ensure accuracy , certain words and phrases may not be transcribed as intended

## 2023-08-06 NOTE — Patient Instructions (Signed)
 It was very nice to meet you today, as dicussed with will plan for the following :  1) Avoid coffee, tea, cola beverages, carbonated beverages, spicy foods, greasy foods, foods high in acid content (e.g. tomatoes and citrus fruits), chocolate, and peppermint 2) Avoid drinking alcoholic beverages 3) Avoid smoking 4) Eat small meals and keep weight within normal range 5) Avoid recumbent posture for 3 hours post-prandially 6) Elevate head of bed  7) Upper endoscopy

## 2023-08-06 NOTE — H&P (View-Only) (Signed)
 [name] , M.D. Gastroenterology & Hepatology [facility] [place] [facility] [address] [place], [place] [address] Primary Care Physician: Junie Spencer, FNP [address] [place] [address]  Chief Complaint:  Abnormal Ct with esophageal wall thickening , dysphagia , constipation   History of Present Illness: Hayley Crawford is a 58 y.o. female with OSA/COPD on CPAP, class III obesity on GLP-1, Chronic GERD  who presents for evaluation of Abnormal Ct with esophageal wall thickening , dysphagia , constipation   Patient had a CT chest done for lung cancer screening as she has a 36-pack-year smoking history quit 2021 which suggested esophagus thickening  Patient reports that she previously never had difficulty swallowing since last the CT finding as she has started to notice food slowing down in the upper chest mostly solid food.  Denies any regurgitation.  Patient reports chronic history of acid reflux which is improved with omeprazole as she has been taking for many years. The patient denies having any nausea, vomiting, fever, chills, hematochezia, melena, hematemesis, abdominal distention, abdominal pain, diarrhea, jaundice, pruritus or unintentional weight loss.  Last UUV:OZDG Cologuard test negative 07/16/2023  FHx: neg for any gastrointestinal/liver disease, no malignancies Social: Former smoker, quit in 2021 Surgical: Hysterectomy, cholecystectomy  Labs from 07/03/2023 normal liver enzymes hemoglobin 12.6 platelet 332  Past Medical History: Past Medical History:  Diagnosis Date   B12 deficiency    COPD (chronic obstructive pulmonary disease) (HCC)    Hyperlipidemia    Hypertension    Uterine fibroid     Past Surgical History: Past Surgical History:  Procedure Laterality Date   ABDOMINAL HYSTERECTOMY     Partial   bladder stretching     CHOLECYSTECTOMY     HEMORROIDECTOMY     TENDON REPAIR     right thumb   tumor removed       Family History: Family History  Problem Relation Age of Onset   Diabetes Other    Cancer Mother        lung   Brain cancer Mother        tumor    Social History: Social History   Tobacco Use  Smoking Status Former   Current packs/day: 0.00   Average packs/day: 2.0 packs/day for 38.0 years (76.0 ttl pk-yrs)   Types: Cigarettes   Start date: 23   Quit date: 2021   Years since quitting: 4.2  Smokeless Tobacco Never   Social History   Substance and Sexual Activity  Alcohol Use Yes   Social History   Substance and Sexual Activity  Drug Use No    Allergies: Allergies  Allergen Reactions   Sulfa Antibiotics Swelling    "think it caused my throat to swell"    Medications: Current Outpatient Medications  Medication Sig Dispense Refill   albuterol (VENTOLIN HFA) 108 (90 Base) MCG/ACT inhaler Inhale 2 puffs into the lungs every 6 (six) hours as needed for wheezing or shortness of breath. 1 each 3   azelastine (ASTELIN) 0.1 % nasal spray Place 1 spray into both nostrils 2 (two) times daily. Use in each nostril as directed 30 mL 0   busPIRone (BUSPAR) 10 MG tablet Take 1 tablet (10 mg total) by mouth 3 (three) times daily as needed. 90 tablet 1   cyclobenzaprine (FLEXERIL) 10 MG tablet Take 1 tablet (10 mg total) by mouth 3 (three) times daily as needed for muscle spasms. 60 tablet 1   diclofenac (VOLTAREN) 75 MG EC  tablet Take 1 tablet (75 mg total) by mouth 2 (two) times daily. 180 tablet 2   hydrochlorothiazide (HYDRODIURIL) 25 MG tablet Take 1 tablet (25 mg total) by mouth daily. ) 90 tablet 1   ipratropium (ATROVENT) 0.03 % nasal spray Place 2 sprays into both nostrils every 12 (twelve) hours. 30 mL 0   losartan (COZAAR) 100 MG tablet Take 1 tablet (100 mg total) by mouth daily. 90 tablet 3   nitroGLYCERIN (NITROSTAT) 0.4 MG SL tablet Place 1 tablet (0.4 mg total) under the tongue every 5 (five) minutes as needed for chest pain. 10 tablet 0   omeprazole  (PRILOSEC) 20 MG capsule Take 1 capsule (20 mg total) by mouth daily. 90 capsule 2   ondansetron (ZOFRAN) 4 MG tablet Take 1 tablet (4 mg total) by mouth every 8 (eight) hours as needed for nausea or vomiting. 30 tablet 2   polyethylene glycol (MIRALAX / GLYCOLAX) 17 g packet Take 17 g by mouth 2 (two) times daily. 180 packet 0   promethazine (PHENERGAN) 25 MG tablet TAKE 1/2 TO 1 TABLETS BY MOUTH EVERY 8 HOURS AS NEEDED FOR NAUSE OR VOMITING 20 tablet 2   Semaglutide-Weight Management (WEGOVY) 1 MG/0.5ML SOAJ Inject 1 mg into the skin once a week. 2 mL 2   Vitamin D, Ergocalciferol, (DRISDOL) 1.25 MG (50000 UNIT) CAPS capsule Take 1 capsule (50,000 Units total) by mouth every 7 (seven) days. 12 capsule 3   No current facility-administered medications for this visit.    Review of Systems: GENERAL: negative for malaise, night sweats HEENT: No changes in hearing or vision, no nose bleeds or other nasal problems. NECK: Negative for lumps, goiter, pain and significant neck swelling RESPIRATORY: Negative for cough, wheezing CARDIOVASCULAR: Negative for chest pain, leg swelling, palpitations, orthopnea GI: SEE HPI MUSCULOSKELETAL: Negative for joint pain or swelling, back pain, and muscle pain. SKIN: Negative for lesions, rash HEMATOLOGY Negative for prolonged bleeding, bruising easily, and swollen nodes. ENDOCRINE: Negative for cold or heat intolerance, polyuria, polydipsia and goiter. NEURO: negative for tremor, gait imbalance, syncope and seizures. The remainder of the review of systems is noncontributory.   Physical Exam: BP 113/77   Pulse 70   Temp 98.2 F (36.8 C)   Ht 5\' 2"  (1.575 m)   Wt 243 lb 1.6 oz (110.3 kg)   LMP 05/31/2014   BMI 44.46 kg/m  GENERAL: The patient is AO x3, in no acute distress. HEENT: Head is normocephalic and atraumatic. EOMI are intact. Mouth is well hydrated and without lesions. NECK: Supple. No masses LUNGS: Clear to auscultation. No presence of  rhonchi/wheezing/rales. Adequate chest expansion HEART: RRR, normal s1 and s2. ABDOMEN: Soft, nontender, no guarding, no peritoneal signs, and nondistended. BS +. No masses.  Imaging/Labs: as above     Latest Ref Rng & Units 07/03/2023    2:48 PM 12/17/2022   12:31 PM 12/21/2021    2:34 PM  CBC  WBC 3.4 - 10.8 x10E3/uL 8.5  9.0  7.3   Hemoglobin 11.1 - 15.9 g/dL 16.1  09.6  04.5   Hematocrit 34.0 - 46.6 % 36.9  39.4  37.9   Platelets 150 - 450 x10E3/uL 332  300  302    Lab Results  Component Value Date   IRON 65 01/01/2019   TIBC 364 01/01/2019   FERRITIN 73 01/01/2019    I personally reviewed and interpreted the available labs, imaging and endoscopic files.  Impression and Plan:  Hayley Crawford is a 58  y.o. female with OSA/COPD on CPAP, class III obesity on GLP-1, Chronic GERD  who presents for evaluation of Abnormal Ct with esophageal wall thickening , dysphagia , constipation   #Abnormal Ct with esophageal wall thickening  #Dysphagia  Patient had a CT chest done for lung cancer screening as she has a 36-pack-year smoking history quit 2021 which suggested esophagus thickening  Patient reports that she previously never had difficulty swallowing since last the CT finding as she has started to notice food slowing down in the upper chest mostly solid food.    This could be esophagitis , ring web or peptic stricture.  Patient does have risk factors with obesity history of heavy smoking and chronic GERD and without upper endoscopy unable to rule out malignancy  Will proceed with upper endoscopy  I thoroughly discussed with the patient the procedure, including the risks involved. Patient understands what the procedure involves including the benefits and any risks. Patient understands alternatives to the proposed procedure. Risks including (but not limited to) bleeding, tearing of the lining (perforation), rupture of adjacent organs, problems with heart and lung function, infection,  and medication reactions. A small percentage of complications may require surgery, hospitalization, repeat endoscopic procedure, and/or transfusion.  Patient understood and agreed.   #Constipation  Patient likely has medication induced constipation from [facility] which is expected She appears to have great response in terms of weight loss and recommend continuing Wegovy  Ensure adequate fluid intake: Aim for 8 glasses of water daily. Follow a high fiber diet: Include foods such as dates, prunes, pears, and kiwi. Take Miralax twice a day for the first week, then reduce to once daily thereafter.  #BMI 44     - walking at a brisk pace/biking at moderate intesity 2.5-5 hours per week     - use pedometer/step counter to track activity     - goal to lose 5-10% of initial body weight     - avoid suagry drinks and juices, use zero calorie beverages     - increase water intake     - eat a low carb diet with plenty of veggies and fruit     - Get sufficient sleep 7-8 hrs nightly     - maitain active lifestyle     - avoid alcohol     - recommend 2-3 cups Coffee daily     - Counsel on lowering cholesterol by having a diet rich in vegetables,          protein (avoid red meats) and good fats(fish, salmon).  All questions were answered.      [name], MD Gastroenterology and Hepatology [facility]astroenter[facility]   This chart has been completed using [facility] Dictation software, and while attempts have been made to ensure accuracy , certain words and phrases may not be transcribed as intended

## 2023-08-07 ENCOUNTER — Telehealth (INDEPENDENT_AMBULATORY_CARE_PROVIDER_SITE_OTHER): Payer: Self-pay | Admitting: Gastroenterology

## 2023-08-07 NOTE — Telephone Encounter (Signed)
 Pt seen in office yesterday and needing EGD. No availability in April for Room 3. Contacted pt and advised her I would have to call once I have May schedule or if I get a cancellation. Pt verbalized understanding.

## 2023-08-08 ENCOUNTER — Encounter (INDEPENDENT_AMBULATORY_CARE_PROVIDER_SITE_OTHER): Payer: Self-pay

## 2023-08-08 NOTE — Telephone Encounter (Signed)
 Cancellation for 08/15/23 at 8:30. Contacted pt and pt was able to take that appt. Advised pt to hold wegovy from now until after procedure. Instructions placed on my chart. No pa needed per insurance. Will send my chart message once pre op is received.

## 2023-08-13 ENCOUNTER — Encounter (HOSPITAL_COMMUNITY): Payer: Self-pay

## 2023-08-13 ENCOUNTER — Encounter (HOSPITAL_COMMUNITY)
Admission: RE | Admit: 2023-08-13 | Discharge: 2023-08-13 | Disposition: A | Discharge status: Home / Self Care | Source: Ambulatory Visit | Attending: Gastroenterology | Admitting: Gastroenterology

## 2023-08-13 VITALS — BP 134/79 | HR 74 | Temp 99.0°F

## 2023-08-13 DIAGNOSIS — Z6841 Body Mass Index (BMI) 40.0 and over, adult: Secondary | ICD-10-CM | POA: Diagnosis not present

## 2023-08-13 DIAGNOSIS — Z87891 Personal history of nicotine dependence: Secondary | ICD-10-CM | POA: Diagnosis not present

## 2023-08-13 DIAGNOSIS — J449 Chronic obstructive pulmonary disease, unspecified: Secondary | ICD-10-CM | POA: Diagnosis not present

## 2023-08-13 DIAGNOSIS — K2289 Other specified disease of esophagus: Secondary | ICD-10-CM | POA: Diagnosis not present

## 2023-08-13 DIAGNOSIS — F419 Anxiety disorder, unspecified: Secondary | ICD-10-CM | POA: Diagnosis not present

## 2023-08-13 DIAGNOSIS — E66813 Obesity, class 3: Secondary | ICD-10-CM | POA: Diagnosis not present

## 2023-08-13 DIAGNOSIS — K5900 Constipation, unspecified: Secondary | ICD-10-CM | POA: Diagnosis not present

## 2023-08-13 DIAGNOSIS — I10 Essential (primary) hypertension: Secondary | ICD-10-CM | POA: Insufficient documentation

## 2023-08-13 DIAGNOSIS — Z01810 Encounter for preprocedural cardiovascular examination: Secondary | ICD-10-CM | POA: Insufficient documentation

## 2023-08-13 DIAGNOSIS — Z79899 Other long term (current) drug therapy: Secondary | ICD-10-CM | POA: Diagnosis not present

## 2023-08-13 DIAGNOSIS — G4733 Obstructive sleep apnea (adult) (pediatric): Secondary | ICD-10-CM | POA: Diagnosis not present

## 2023-08-13 DIAGNOSIS — K2970 Gastritis, unspecified, without bleeding: Secondary | ICD-10-CM | POA: Diagnosis not present

## 2023-08-13 DIAGNOSIS — R933 Abnormal findings on diagnostic imaging of other parts of digestive tract: Secondary | ICD-10-CM | POA: Diagnosis not present

## 2023-08-13 DIAGNOSIS — K219 Gastro-esophageal reflux disease without esophagitis: Secondary | ICD-10-CM | POA: Diagnosis not present

## 2023-08-13 DIAGNOSIS — R1310 Dysphagia, unspecified: Secondary | ICD-10-CM | POA: Diagnosis not present

## 2023-08-13 HISTORY — DX: Sleep apnea, unspecified: G47.30

## 2023-08-13 NOTE — Patient Instructions (Signed)
 Hayley Crawford  08/13/2023     @PREFPERIOPPHARMACY @   Your procedure is scheduled on 08/15/23.   Report to [facility] at 6:45 A.M.   Call this number if you have problems the morning of surgery:   [phone]  If you experience any cold or flu symptoms such as cough, fever, chills, shortness of breath, etc. between now and your scheduled surgery, please notify us at the above number.   Remember:   Please Follow the diet instructions given to you by Dr Adalberto Ill office.   You may drink clear liquids until 4:30 AM .  Clear liquids allowed are:                    Water, Juice (No red color; non-citric and without pulp; diabetics please choose diet or no sugar options), Carbonated beverages (diabetics please choose diet or no sugar options), Clear Tea (No creamer, milk, or cream, including half & half and powdered creamer), Black Coffee Only (No creamer, milk or cream, including half & half and powdered creamer), Plain Jell-O Only (No red color; diabetics please choose no sugar options), Clear Sports drink (No red color; diabetics please choose diet or no sugar options), and Plain Popsicles Only (No red color; diabetics please choose no sugar options)    Take these medicines the morning of surgery with A SIP OF WATER : Buspirone Omeprazole cyclobenzaprine and Zofran (if needed)    Do not wear jewelry, make-up or nail polish, including gel polish,  artificial nails, or any other type of covering on natural nails (fingers and  toes).  Do not wear lotions, powders, or perfumes, or deodorant.  Do not shave 48 hours prior to surgery.  Men may shave face and neck.  Do not bring valuables to the hospital.  [facility] is not responsible for any belongings or valuables.  Contacts, dentures or bridgework may not be worn into surgery.  Leave your suitcase in the car.  After surgery it may be brought to your room.  For patients admitted to the hospital, discharge time will be determined by your  treatment team.  Patients discharged the day of surgery will not be allowed to drive home.   Name and phone number of your driver:   Family Special instructions:  N/A  Please read over the following fact sheets that you were given. Care and Recovery After Surgery  Upper Endoscopy, Adult Upper endoscopy is a procedure to look inside the upper GI (gastrointestinal) tract. The upper GI tract is made up of: The esophagus. This is the part of the body that moves food from your mouth to your stomach. The stomach. The duodenum. This is the first part of your small intestine. This procedure is also called esophagogastroduodenoscopy (EGD) or gastroscopy. In this procedure, your health care provider passes a thin, flexible tube (endoscope) through your mouth and down your esophagus into your stomach and into your duodenum. A small camera is attached to the end of the tube. Images from the camera appear on a monitor in the exam room. During this procedure, your health care provider may also remove a small piece of tissue to be sent to a lab and examined under a microscope (biopsy). Your health care provider may do an upper endoscopy to diagnose cancers of the upper GI tract. You may also have this procedure to find the cause of other conditions, such as: Stomach pain. Heartburn. Pain or problems when swallowing. Nausea and vomiting. Stomach bleeding.  Stomach ulcers. Tell a health care provider about: Any allergies you have. All medicines you are taking, including vitamins, herbs, eye drops, creams, and over-the-counter medicines. Any problems you or family members have had with anesthetic medicines. Any bleeding problems you have. Any surgeries you have had. Any medical conditions you have. Whether you are pregnant or may be pregnant. What are the risks? Your healthcare provider will talk with you about risks. These may include: Infection. Bleeding. Allergic reactions to medicines. A tear or  hole (perforation) in the esophagus, stomach, or duodenum. What happens before the procedure? When to stop eating and drinking Follow instructions from your health care provider about what you may eat and drink. These may include: 8 hours before your procedure Stop eating most foods. Do not eat meat, fried foods, or fatty foods. Eat only light foods, such as toast or crackers. All liquids are okay except energy drinks and alcohol. 6 hours before your procedure Stop eating. Drink only clear liquids, such as water, clear fruit juice, black coffee, plain tea, and sports drinks. Do not drink energy drinks or alcohol. 2 hours before your procedure Stop drinking all liquids. You may be allowed to take medicines with small sips of water. If you do not follow your health care provider's instructions, your procedure may be delayed or canceled. Medicines Ask your health care provider about: Changing or stopping your regular medicines. This is especially important if you are taking diabetes medicines or blood thinners. Taking medicines such as aspirin and ibuprofen. These medicines can thin your blood. Do not take these medicines unless your health care provider tells you to take them. Taking over-the-counter medicines, vitamins, herbs, and supplements. General instructions If you will be going home right after the procedure, plan to have a responsible adult: Take you home from the hospital or clinic. You will not be allowed to drive. Care for you for the time you are told. What happens during the procedure?  An IV will be inserted into one of your veins. You may be given one or more of the following: A medicine to help you relax (sedative). A medicine to numb the throat (local anesthetic). You will lie on your left side on an exam table. Your health care provider will pass the endoscope through your mouth and down your esophagus. Your health care provider will use the scope to check the inside  of your esophagus, stomach, and duodenum. Biopsies may be taken. The endoscope will be removed. The procedure may vary among health care providers and hospitals. What happens after the procedure? Your blood pressure, heart rate, breathing rate, and blood oxygen level will be monitored until you leave the hospital or clinic. When your throat is no longer numb, you may be given some fluids to drink. If you were given a sedative during the procedure, it can affect you for several hours. Do not drive or operate machinery until your health care provider says that it is safe. It is up to you to get the results of your procedure. Ask your health care provider, or the department that is doing the procedure, when your results will be ready. Contact a health care provider if you: Have a sore throat that lasts longer than 1 day. Have a fever. Get help right away if you: Vomit blood or your vomit looks like coffee grounds. Have bloody, black, or tarry stools. Have a very bad sore throat or you cannot swallow. Have difficulty breathing or very bad pain in your chest  or abdomen. These symptoms may be an emergency. Get help right away. Call 911. Do not wait to see if the symptoms will go away. Do not drive yourself to the hospital. Summary Upper endoscopy is a procedure to look inside the upper GI tract. During the procedure, an IV will be inserted into one of your veins. You may be given a medicine to help you relax. The endoscope will be passed through your mouth and down your esophagus. Follow instructions from your health care provider about what you can eat and drink. This information is not intended to replace advice given to you by your health care provider. Make sure you discuss any questions you have with your health care provider. Document Revised: 08/01/2021 Document Reviewed: 08/01/2021 Elsevier Patient Education  2024 [employer] Monitored Anesthesia Care Anesthesia refers to the  techniques, procedures, and medicines that help a person stay safe and comfortable during surgery. Monitored anesthesia care, or sedation, is one type of anesthesia. You may have sedation if you do not need to be asleep for your procedure. Procedures that use sedation may include: Surgery to remove cataracts from your eyes. A dental procedure. A biopsy. This is when a tissue sample is removed and looked at under a microscope. You will be watched closely during your procedure. Your level of sedation or type of anesthesia may be changed to fit your needs. Tell a health care provider about: Any allergies you have. All medicines you are taking, including vitamins, herbs, eye drops, creams, and over-the-counter medicines. Any problems you or family members have had with anesthesia. Any bleeding problems you have. Any surgeries you have had. Any medical conditions or illnesses you have. This includes sleep apnea, cough, fever, or the flu. Whether you are pregnant or may be pregnant. Whether you use cigarettes, alcohol, or drugs. Any use of steroids, whether by mouth or as a cream. What are the risks? Your health care provider will talk with you about risks. These may include: Getting too much medicine (oversedation). Nausea. Allergic reactions to medicines. Trouble breathing. If this happens, a breathing tube may be used to help you breathe. It will be removed when you are awake and breathing on your own. Heart trouble. Lung trouble. Confusion that gets better with time (emergence delirium). What happens before the procedure? When to stop eating and drinking Follow instructions from your health care provider about what you may eat and drink. These may include: 8 hours before your procedure Stop eating most foods. Do not eat meat, fried foods, or fatty foods. Eat only light foods, such as toast or crackers. All liquids are okay except energy drinks and alcohol. 6 hours before your  procedure Stop eating. Drink only clear liquids, such as water, clear fruit juice, black coffee, plain tea, and sports drinks. Do not drink energy drinks or alcohol. 2 hours before your procedure Stop drinking all liquids. You may be allowed to take medicines with small sips of water. If you do not follow your health care provider's instructions, your procedure may be delayed or canceled. Medicines Ask your health care provider about: Changing or stopping your regular medicines. These include any diabetes medicines or blood thinners you take. Taking medicines such as aspirin and ibuprofen. These medicines can thin your blood. Do not take them unless your health care provider tells you to. Taking over-the-counter medicines, vitamins, herbs, and supplements. Testing You may have an exam or testing. You may have a blood or urine sample taken. General instructions Do  not use any products that contain nicotine or tobacco for at least 4 weeks before the procedure. These products include cigarettes, chewing tobacco, and vaping devices, such as e-cigarettes. If you need help quitting, ask your health care provider. If you will be going home right after the procedure, plan to have a responsible adult: Take you home from the hospital or clinic. You will not be allowed to drive. Care for you for the time you are told. What happens during the procedure?  Your blood pressure, heart rate, breathing, level of pain, and blood oxygen level will be monitored. An IV will be inserted into one of your veins. You may be given: A sedative. This helps you relax. Anesthesia. This will: Numb certain areas of your body. Make you fall asleep for surgery. You will be given medicines as needed to keep you comfortable. The more medicine you are given, the deeper your level of sedation will be. Your level of sedation may be changed to fit your needs. There are three levels of sedation: Mild sedation. At this level,  you may feel awake and relaxed. You will be able to follow directions. Moderate sedation. At this level, you will be sleepy. You may not remember the procedure. Deep sedation. At this level, you will be asleep. You will not remember the procedure. How you get the medicines will depend on your age and the procedure. They may be given as: A pill. This may be taken by mouth (orally) or inserted into the rectum. An injection. This may be into a vein or muscle. A spray through the nose. After your procedure is over, the medicine will be stopped. The procedure may vary among health care providers and hospitals. What happens after the procedure? Your blood pressure, heart rate, breathing rate, and blood oxygen level will be monitored until you leave the hospital or clinic. You may feel sleepy, clumsy, or nauseous. You may not remember what happened during or after the procedure. Sedation can affect you for several hours. Do not drive or use machinery until your health care provider says that it is safe. This information is not intended to replace advice given to you by your health care provider. Make sure you discuss any questions you have with your health care provider. Document Revised: 09/17/2021 Document Reviewed: 09/17/2021 Elsevier Patient Education  2024 [employer].

## 2023-08-15 ENCOUNTER — Ambulatory Visit (HOSPITAL_COMMUNITY): Admitting: Anesthesiology

## 2023-08-15 ENCOUNTER — Other Ambulatory Visit: Payer: Self-pay

## 2023-08-15 ENCOUNTER — Encounter (HOSPITAL_COMMUNITY)
Admission: RE | Disposition: A | Discharge status: Home / Self Care | Payer: Self-pay | Source: Home / Self Care | Attending: Gastroenterology

## 2023-08-15 ENCOUNTER — Telehealth: Payer: Self-pay

## 2023-08-15 ENCOUNTER — Encounter (HOSPITAL_COMMUNITY): Payer: Self-pay | Admitting: Gastroenterology

## 2023-08-15 ENCOUNTER — Ambulatory Visit (HOSPITAL_COMMUNITY)
Admission: RE | Admit: 2023-08-15 | Discharge: 2023-08-15 | Disposition: A | Discharge status: Home / Self Care | Attending: Gastroenterology | Admitting: Gastroenterology

## 2023-08-15 DIAGNOSIS — K259 Gastric ulcer, unspecified as acute or chronic, without hemorrhage or perforation: Secondary | ICD-10-CM | POA: Diagnosis not present

## 2023-08-15 DIAGNOSIS — Z6841 Body Mass Index (BMI) 40.0 and over, adult: Secondary | ICD-10-CM | POA: Diagnosis not present

## 2023-08-15 DIAGNOSIS — Z79899 Other long term (current) drug therapy: Secondary | ICD-10-CM | POA: Insufficient documentation

## 2023-08-15 DIAGNOSIS — F419 Anxiety disorder, unspecified: Secondary | ICD-10-CM | POA: Insufficient documentation

## 2023-08-15 DIAGNOSIS — J449 Chronic obstructive pulmonary disease, unspecified: Secondary | ICD-10-CM | POA: Diagnosis not present

## 2023-08-15 DIAGNOSIS — K2289 Other specified disease of esophagus: Secondary | ICD-10-CM | POA: Diagnosis not present

## 2023-08-15 DIAGNOSIS — R1310 Dysphagia, unspecified: Secondary | ICD-10-CM | POA: Diagnosis not present

## 2023-08-15 DIAGNOSIS — I10 Essential (primary) hypertension: Secondary | ICD-10-CM | POA: Insufficient documentation

## 2023-08-15 DIAGNOSIS — K5900 Constipation, unspecified: Secondary | ICD-10-CM | POA: Diagnosis not present

## 2023-08-15 DIAGNOSIS — E66813 Obesity, class 3: Secondary | ICD-10-CM | POA: Insufficient documentation

## 2023-08-15 DIAGNOSIS — Z87891 Personal history of nicotine dependence: Secondary | ICD-10-CM | POA: Insufficient documentation

## 2023-08-15 DIAGNOSIS — K2970 Gastritis, unspecified, without bleeding: Secondary | ICD-10-CM

## 2023-08-15 DIAGNOSIS — G4733 Obstructive sleep apnea (adult) (pediatric): Secondary | ICD-10-CM | POA: Diagnosis not present

## 2023-08-15 DIAGNOSIS — K219 Gastro-esophageal reflux disease without esophagitis: Secondary | ICD-10-CM | POA: Insufficient documentation

## 2023-08-15 DIAGNOSIS — R933 Abnormal findings on diagnostic imaging of other parts of digestive tract: Secondary | ICD-10-CM | POA: Diagnosis not present

## 2023-08-15 SURGERY — EGD (ESOPHAGOGASTRODUODENOSCOPY)
Anesthesia: General

## 2023-08-15 MED ORDER — LACTATED RINGERS IV SOLN: INTRAVENOUS | Status: DC

## 2023-08-15 MED ORDER — LIDOCAINE HCL (CARDIAC) PF 100 MG/5ML IV SOSY
PREFILLED_SYRINGE | INTRAVENOUS | Status: DC | PRN
  Administered 2023-08-15: 80 mg via INTRATRACHEAL

## 2023-08-15 MED ORDER — PROPOFOL 10 MG/ML IV BOLUS
INTRAVENOUS | Status: DC | PRN
  Administered 2023-08-15: 120 mg via INTRAVENOUS
  Administered 2023-08-15: 80 mg via INTRAVENOUS

## 2023-08-15 NOTE — Discharge Instructions (Signed)

## 2023-08-15 NOTE — Interval H&P Note (Signed)
 History and Physical Interval Note:  08/15/2023 8:53 AM  Hayley Crawford  has presented today for surgery, with the diagnosis of ABNORMAL CT, DYSPHAGIA.  The various methods of treatment have been discussed with the patient and family. After consideration of risks, benefits and other options for treatment, the patient has consented to  Procedure(s) with comments: EGD (ESOPHAGOGASTRODUODENOSCOPY) (N/A) - 8:30AM;ASA 3 as a surgical intervention.  The patient's history has been reviewed, patient examined, no change in status, stable for surgery.  I have reviewed the patient's chart and labs.  Questions were answered to the patient's satisfaction.     [name] [name]

## 2023-08-15 NOTE — Telephone Encounter (Signed)
 Pharmacy Patient Advocate Encounter   Received notification from CoverMyMeds that prior authorization for Wegovy 1MG /0.5ML auto-injectors is required/requested.   Insurance verification completed.   The patient is insured through [facility] .   Per test claim: PA required; PA started via CoverMyMeds. KEY [id number] . Waiting for clinical questions to populate.

## 2023-08-15 NOTE — Op Note (Addendum)
 [facility] Patient Name: Hayley Crawford Procedure Date: 08/15/2023 8:48 AM MRN: [id number] Date of Birth: 18-Oct-1965 Attending MD: Sanjuan Dame , MD, [id number] CSN: [id number] Age: 58 Admit Type: Outpatient Procedure:                Upper GI endoscopy Indications:              Dysphagia, Abnormal CT of the GI tract Providers:                Sanjuan Dame, MD, Angelica Ran, Lennice Sites                            Technician, Technician Referring MD:              Medicines:                Monitored Anesthesia Care Complications:            No immediate complications. Estimated Blood Loss:     Estimated blood loss: none. Procedure:                Pre-Anesthesia Assessment:                           - Prior to the procedure, a History and Physical                            was performed, and patient medications and                            allergies were reviewed. The patient's tolerance of                            previous anesthesia was also reviewed. The risks                            and benefits of the procedure and the sedation                            options and risks were discussed with the patient.                            All questions were answered, and informed consent                            was obtained. Prior Anticoagulants: The patient has                            taken no anticoagulant or antiplatelet agents. ASA                            Grade Assessment: III - A patient with severe                            systemic disease. After reviewing the risks and  benefits, the patient was deemed in satisfactory                            condition to undergo the procedure.                           After obtaining informed consent, the endoscope was                            passed under direct vision. Throughout the                            procedure, the patient's blood pressure, pulse, and                            oxygen  saturations were monitored continuously. The                            [id number]-H190 ([id number]) scope was introduced through the                            mouth, and advanced to the second part of duodenum.                            The upper GI endoscopy was accomplished without                            difficulty. The patient tolerated the procedure                            well. Scope In: 9:10:16 AM Scope Out: 9:16:01 AM Total Procedure Duration: 0 hours 5 minutes 45 seconds  Findings:      A single medium-sized bleb was found in the mid esophagus.      Inflammation characterized by erosions was found in the gastric antrum.       Biopsies were taken with a cold forceps for histology.      The duodenal bulb and second portion of the duodenum were normal. Impression:               - Bleb found in the esophagus. This appears to be                            soft and with pulsation on examination ; appears to                            be benign either a blood vessel, lipoma,                            duplication cyst etc                           - Gastritis. Biopsied.                           - Normal  duodenal bulb and second portion of the                            duodenum. Moderate Sedation:      Per Anesthesia Care Recommendation:           - Patient has a contact number available for                            emergencies. The signs and symptoms of potential                            delayed complications were discussed with the                            patient. Return to normal activities tomorrow.                            Written discharge instructions were provided to the                            patient.                           - Resume previous diet.                           - Continue present medications.                           - Await pathology results.                           - EUS for definative diagnosis ;Will refer the                             patient Procedure Code(s):        --- Professional ---                           [phone], Esophagogastroduodenoscopy, flexible,                            transoral; with biopsy, single or multiple Diagnosis Code(s):        --- Professional ---                           K22.89, Other specified disease of esophagus                           K29.70, Gastritis, unspecified, without bleeding                           R13.10, Dysphagia, unspecified                           R93.3, Abnormal findings on diagnostic imaging of  other parts of digestive tract CPT copyright 2022 American Medical Association. All rights reserved. The codes documented in this report are preliminary and upon coder review may  be revised to meet current compliance requirements. Sanjuan Dame, MD Sanjuan Dame, MD 08/15/2023 9:25:10 AM This report has been signed electronically. Number of Addenda: 0

## 2023-08-15 NOTE — Anesthesia Procedure Notes (Signed)
 Date/Time: 08/15/2023 9:00 AM  Performed by: Franco Nones, CRNAPre-anesthesia Checklist: Patient identified, Emergency Drugs available, Suction available, Timeout performed and Patient being monitored Patient Re-evaluated:Patient Re-evaluated prior to induction Oxygen Delivery Method: Nasal cannula Comments: Optiflow

## 2023-08-15 NOTE — Anesthesia Preprocedure Evaluation (Addendum)
 Anesthesia Evaluation  Patient identified by MRN, date of birth, ID band Patient awake    Reviewed: Allergy & Precautions, H&P , NPO status , Patient's Chart, lab work & pertinent test results, reviewed documented beta blocker date and time   Airway Mallampati: II  TM Distance: >3 FB Neck ROM: full    Dental no notable dental hx.    Pulmonary sleep apnea , COPD, former smoker   Pulmonary exam normal breath sounds clear to auscultation       Cardiovascular Exercise Tolerance: Good hypertension,  Rhythm:regular Rate:Normal     Neuro/Psych  PSYCHIATRIC DISORDERS Anxiety     negative neurological ROS     GI/Hepatic Neg liver ROS,GERD  ,,  Endo/Other    Class 3 obesity  Renal/GU negative Renal ROS  negative genitourinary   Musculoskeletal   Abdominal   Peds  Hematology  (+) Blood dyscrasia, anemia   Anesthesia Other Findings   Reproductive/Obstetrics negative OB ROS                             Anesthesia Physical Anesthesia Plan  ASA: 3  Anesthesia Plan: General   Post-op Pain Management:    Induction:   PONV Risk Score and Plan:   Airway Management Planned:   Additional Equipment:   Intra-op Plan:   Post-operative Plan:   Informed Consent: I have reviewed the patients History and Physical, chart, labs and discussed the procedure including the risks, benefits and alternatives for the proposed anesthesia with the patient or authorized representative who has indicated his/her understanding and acceptance.     Dental Advisory Given  Plan Discussed with: CRNA  Anesthesia Plan Comments:         Anesthesia Quick Evaluation

## 2023-08-15 NOTE — Transfer of Care (Signed)
 Immediate Anesthesia Transfer of Care Note  Patient: Hayley Crawford  Procedure(s) Performed: EGD (ESOPHAGOGASTRODUODENOSCOPY)  Patient Location: Short Stay  Anesthesia Type:General  Level of Consciousness: awake and patient cooperative  Airway & Oxygen Therapy: Patient Spontanous Breathing  Post-op Assessment: Report given to RN and Post -op Vital signs reviewed and stable  Post vital signs: Reviewed and stable  Last Vitals:  Vitals Value Taken Time  BP 111/64 08/15/23 0922  Temp 36.4 C 08/15/23 0922  Pulse 68 08/15/23 0922  Resp 19 08/15/23 0922  SpO2 98 % 08/15/23 0922    Last Pain:  Vitals:   08/15/23 0922  TempSrc: Axillary  PainSc: 0-No pain      Patients Stated Pain Goal: 5 (08/15/23 0743)  Complications: No notable events documented.

## 2023-08-18 ENCOUNTER — Encounter (HOSPITAL_COMMUNITY): Payer: Self-pay | Admitting: Gastroenterology

## 2023-08-18 LAB — SURGICAL PATHOLOGY

## 2023-08-19 ENCOUNTER — Telehealth (INDEPENDENT_AMBULATORY_CARE_PROVIDER_SITE_OTHER): Payer: Self-pay | Admitting: *Deleted

## 2023-08-19 NOTE — Telephone Encounter (Signed)
-----   Message from Hargis Lias sent at 08/15/2023 11:34 AM EDT ----- Regarding: Layla Prier Rhianon Zabawa Can you please refer this patient to Dr Brice Campi for EUS Diagnosis : esophageal lesion

## 2023-08-19 NOTE — Telephone Encounter (Signed)
Message sent to [facility]

## 2023-08-20 ENCOUNTER — Telehealth: Payer: Self-pay

## 2023-08-20 NOTE — Telephone Encounter (Signed)
-----   Message from [facility] sent at 08/20/2023  6:08 AM EDT ----- Russ Course, May schedule next available upper EUS for this patient for esophageal lesion/nodule. Thanks. GM ----- Message ----- From: Aneita Keens, RN Sent: 08/19/2023  11:02 AM EDT To: Daune Eric., MD   ----- Message ----- From: Vallie Gay Sent: 08/19/2023   9:32 AM EDT To: Aneita Keens, RN; Hargis Lias, MD  Per Dr Alita Irwin, refer patient to Dr Brice Campi for EUS Diagnosis : esophageal lesion

## 2023-08-20 NOTE — Telephone Encounter (Signed)
 Clinical questions have been answered and PA submitted. PA currently Pending.

## 2023-08-21 NOTE — Telephone Encounter (Signed)
 EUS scheduled, pt instructed and medications reviewed.  Patient instructions mailed to home.  Patient to call with any questions or concerns.

## 2023-08-21 NOTE — Telephone Encounter (Addendum)
 EUS has been entered for 10/16/23 at 345 pm at [facility] with GM   Left message on machine to call back

## 2023-08-22 ENCOUNTER — Other Ambulatory Visit (HOSPITAL_COMMUNITY): Payer: Self-pay

## 2023-08-22 NOTE — Telephone Encounter (Signed)
 Pharmacy Patient Advocate Encounter  Received notification from [facility] that Prior Authorization for WEGOVY  1MG /0.5ML has been APPROVED from 08/20/2023 to 08/19/2024. Ran test claim, Copay is $24.99. This test claim was processed through [facility]- copay amounts may vary at other pharmacies due to pharmacy/plan contracts, or as the patient moves through the different stages of their insurance plan.   PA #/Case ID/Reference #: [id number]

## 2023-08-25 ENCOUNTER — Encounter (INDEPENDENT_AMBULATORY_CARE_PROVIDER_SITE_OTHER): Payer: Self-pay | Admitting: *Deleted

## 2023-08-25 NOTE — Anesthesia Postprocedure Evaluation (Signed)
 Anesthesia Post Note  Patient: Hayley Crawford  Procedure(s) Performed: EGD (ESOPHAGOGASTRODUODENOSCOPY)  Patient location during evaluation: Phase II Anesthesia Type: General Level of consciousness: awake Pain management: pain level controlled Vital Signs Assessment: post-procedure vital signs reviewed and stable Respiratory status: spontaneous breathing and respiratory function stable Cardiovascular status: blood pressure returned to baseline and stable Postop Assessment: no headache and no apparent nausea or vomiting Anesthetic complications: no Comments: Late entry   No notable events documented.   Last Vitals:  Vitals:   08/15/23 0745 08/15/23 0922  BP: 126/76 111/64  Pulse: 71 68  Resp: 20 19  Temp: 36.7 C (!) 36.4 C  SpO2: 97% 98%    Last Pain:  Vitals:   08/15/23 0922  TempSrc: Axillary  PainSc: 0-No pain                 Coretha Dew

## 2023-08-27 ENCOUNTER — Other Ambulatory Visit: Payer: Self-pay

## 2023-08-27 DIAGNOSIS — K229 Disease of esophagus, unspecified: Secondary | ICD-10-CM

## 2023-09-01 ENCOUNTER — Encounter: Payer: Self-pay | Admitting: Family Medicine

## 2023-09-17 ENCOUNTER — Other Ambulatory Visit: Payer: Self-pay | Admitting: Family

## 2023-09-17 DIAGNOSIS — K219 Gastro-esophageal reflux disease without esophagitis: Secondary | ICD-10-CM

## 2023-10-03 ENCOUNTER — Encounter: Payer: Self-pay | Admitting: Family

## 2023-10-03 ENCOUNTER — Ambulatory Visit: Payer: BC Managed Care – PPO | Admitting: Family

## 2023-10-03 VITALS — BP 132/78 | HR 85 | Temp 97.0°F | Wt 235.0 lb

## 2023-10-03 DIAGNOSIS — Z713 Dietary counseling and surveillance: Secondary | ICD-10-CM

## 2023-10-03 DIAGNOSIS — G4733 Obstructive sleep apnea (adult) (pediatric): Secondary | ICD-10-CM

## 2023-10-03 DIAGNOSIS — Z87891 Personal history of nicotine dependence: Secondary | ICD-10-CM

## 2023-10-03 DIAGNOSIS — F411 Generalized anxiety disorder: Secondary | ICD-10-CM

## 2023-10-03 DIAGNOSIS — E785 Hyperlipidemia, unspecified: Secondary | ICD-10-CM | POA: Diagnosis not present

## 2023-10-03 DIAGNOSIS — K219 Gastro-esophageal reflux disease without esophagitis: Secondary | ICD-10-CM

## 2023-10-03 DIAGNOSIS — F39 Unspecified mood [affective] disorder: Secondary | ICD-10-CM | POA: Diagnosis not present

## 2023-10-03 DIAGNOSIS — I10 Essential (primary) hypertension: Secondary | ICD-10-CM

## 2023-10-03 DIAGNOSIS — T466X5A Adverse effect of antihyperlipidemic and antiarteriosclerotic drugs, initial encounter: Secondary | ICD-10-CM

## 2023-10-03 DIAGNOSIS — J449 Chronic obstructive pulmonary disease, unspecified: Secondary | ICD-10-CM

## 2023-10-03 DIAGNOSIS — M791 Myalgia, unspecified site: Secondary | ICD-10-CM

## 2023-10-03 MED ORDER — WEGOVY 1.7 MG/0.75ML ~~LOC~~ SOAJ
1.7000 mg | SUBCUTANEOUS | 2 refills | Status: DC
Start: 2023-10-03 — End: 2024-01-02

## 2023-10-03 NOTE — Patient Instructions (Signed)

## 2023-10-03 NOTE — Progress Notes (Signed)
 Subjective:    Patient ID: Hayley Crawford, female    DOB: [birth year], 58 y.o.   MRN: [id number]  Chief Complaint  Patient presents with   Medical Management of Chronic Issues   Pt presents to the office today for chronic follow up and follow up on Wegovy . She is currently taking 1 mg.  She has lost 36 lbs. Her starting weigh was 277 lb.      10/03/2023    2:40 PM 08/15/2023    7:43 AM 08/15/2023    7:30 AM  Last 3 Weights  Weight (lbs) 235 lb 241 lb 243 lb 2.7 oz  Weight (kg) 106.595 kg 109.317 kg 110.3 kg    She has OSA and using her CPAP nightly.   She has COPD, she quit smoking in 2020. Denies any SOB at this time.  Hypertension This is a chronic problem. The current episode started more than 1 year ago. The problem has been resolved since onset. The problem is controlled. Associated symptoms include anxiety. Pertinent negatives include no malaise/fatigue, peripheral edema or shortness of breath. Risk factors for coronary artery disease include obesity. The current treatment provides moderate improvement.  Hyperlipidemia This is a chronic problem. The current episode started more than 1 year ago. The problem is uncontrolled. Recent lipid tests were reviewed and are high. Exacerbating diseases include obesity. Pertinent negatives include no shortness of breath. Current antihyperlipidemic treatment includes diet change. The current treatment provides no improvement of lipids. Risk factors for coronary artery disease include hypertension, a sedentary lifestyle, post-menopausal and obesity.  Gastroesophageal Reflux  Anemia Presents for follow-up visit. There has been no malaise/fatigue.  Anxiety Presents for follow-up visit. Symptoms include excessive worry, nervous/anxious behavior and restlessness. Patient reports no shortness of breath. Symptoms occur occasionally. The severity of symptoms is mild.        Review of Systems  Constitutional:  Negative for malaise/fatigue.   Respiratory:  Negative for shortness of breath.   Psychiatric/Behavioral:  The patient is nervous/anxious.   All other systems reviewed and are negative.   Social History   Socioeconomic History   Marital status: Single    Spouse name: Not on file   Number of children: Not on file   Years of education: Not on file   Highest education level: Associate degree: academic program  Occupational History   Not on file  Tobacco Use   Smoking status: Former    Current packs/day: 0.00    Average packs/day: 2.0 packs/day for 38.0 years (76.0 ttl pk-yrs)    Types: Cigarettes    Start date: 46    Quit date: 2021    Years since quitting: 4.4   Smokeless tobacco: Never  Vaping Use   Vaping status: Never Used  Substance and Sexual Activity   Alcohol use: Yes   Drug use: No   Sexual activity: Never  Other Topics Concern   Not on file  Social History Narrative   Not on file   Social Drivers of Health   Financial Resource Strain: High Risk (10/02/2023)   Overall Financial Resource Strain (CARDIA)    Difficulty of Paying Living Expenses: Hard  Food Insecurity: Food Insecurity Present (10/02/2023)   Hunger Vital Sign    Worried About Running Out of Food in the Last Year: Often true    Ran Out of Food in the Last Year: Never true  Transportation Needs: No Transportation Needs (10/02/2023)   PRAPARE - Administrator, Civil Service (Medical): No  Lack of Transportation (Non-Medical): No  Physical Activity: Insufficiently Active (10/02/2023)   Exercise Vital Sign    Days of Exercise per Week: 5 days    Minutes of Exercise per Session: 20 min  Stress: No Stress Concern Present (10/02/2023)   [name] of Occupational Health - Occupational Stress Questionnaire    Feeling of Stress : Only a little  Social Connections: Moderately Integrated (10/02/2023)   Social Connection and Isolation Panel [NHANES]    Frequency of Communication with Friends and Family: More than three  times a week    Frequency of Social Gatherings with Friends and Family: More than three times a week    Attends Religious Services: 1 to 4 times per year    Active Member of [employer] or Organizations: No    Attends Engineer, structural: More than 4 times per year    Marital Status: Divorced   Family History  Problem Relation Age of Onset   Diabetes Other    Cancer Mother        lung   Brain cancer Mother        tumor        Objective:   Physical Exam Vitals reviewed.  Constitutional:      General: She is not in acute distress.    Appearance: She is well-developed. She is obese.  HENT:     Head: Normocephalic and atraumatic.     Right Ear: Tympanic membrane normal.     Left Ear: Tympanic membrane normal.  Eyes:     Pupils: Pupils are equal, round, and reactive to light.  Neck:     Thyroid : No thyromegaly.  Cardiovascular:     Rate and Rhythm: Normal rate and regular rhythm.     Heart sounds: Normal heart sounds. No murmur heard. Pulmonary:     Effort: Pulmonary effort is normal. No respiratory distress.     Breath sounds: Normal breath sounds. No wheezing.  Abdominal:     General: Bowel sounds are normal. There is no distension.     Palpations: Abdomen is soft.     Tenderness: There is no abdominal tenderness.  Musculoskeletal:        General: No tenderness. Normal range of motion.     Cervical back: Normal range of motion and neck supple.  Skin:    General: Skin is warm and dry.  Neurological:     Mental Status: She is alert and oriented to person, place, and time.     Cranial Nerves: No cranial nerve deficit.     Deep Tendon Reflexes: Reflexes are normal and symmetric.  Psychiatric:        Behavior: Behavior normal.        Thought Content: Thought content normal.        Judgment: Judgment normal.       BP 132/78   Pulse 85   Temp (!) 97 F (36.1 C) (Temporal)   Wt 235 lb (106.6 kg)   LMP 05/31/2014   SpO2 95%   BMI 42.98 kg/m       Assessment & Plan:  IKRAM RIEBE comes in today with chief complaint of Medical Management of Chronic Issues   Diagnosis and orders addressed:  1. GAD (generalized anxiety disorder) (Primary)  2. Mood disorder (HCC)  3. Gastroesophageal reflux disease, unspecified whether esophagitis present  4. Hyperlipidemia, unspecified hyperlipidemia type  5. Primary hypertension  6. Chronic obstructive pulmonary disease, unspecified COPD type (HCC)  7. Hx of smoking  8. OSA (obstructive sleep apnea)  9. Myalgia due to statin  10. Morbid obesity (HCC) Wegovy  to 1.7 mg from 1 mg  - Semaglutide -Weight Management (WEGOVY ) 1.7 MG/0.75ML SOAJ; Inject 1.7 mg into the skin once a week.  Dispense: 3 mL; Refill: 2  11. Weight loss counseling, encounter for - Semaglutide -Weight Management (WEGOVY ) 1.7 MG/0.75ML SOAJ; Inject 1.7 mg into the skin once a week.  Dispense: 3 mL; Refill: 2  Continue Wegovy   1.7 mg Continue current medications  Keep follow up with specialists  Health Maintenance reviewed Diet and exercise encouraged  Return in about 3 months (around 01/03/2024) for med check with pap needs to be 30 min .    Tommas Fragmin, FNP

## 2023-10-06 ENCOUNTER — Encounter: Payer: Self-pay | Admitting: Acute Care

## 2023-10-08 ENCOUNTER — Telehealth: Payer: Self-pay | Admitting: Gastroenterology

## 2023-10-08 NOTE — Telephone Encounter (Signed)
 Procedure:EUS Procedure date: 10/16/23 Procedure location: [facility] Arrival Time: 2:15 pm Spoke with the patient Y/N: Yes Any prep concerns? No  Has the patient obtained the prep from the pharmacy ? Yes Do you have a care partner and transportation: Yes Any additional concerns? No

## 2023-10-10 ENCOUNTER — Encounter (HOSPITAL_COMMUNITY): Payer: Self-pay | Admitting: Gastroenterology

## 2023-10-16 ENCOUNTER — Encounter (HOSPITAL_COMMUNITY): Payer: Self-pay | Admitting: Gastroenterology

## 2023-10-16 ENCOUNTER — Ambulatory Visit (HOSPITAL_COMMUNITY)
Admission: RE | Admit: 2023-10-16 | Discharge: 2023-10-16 | Disposition: A | Discharge status: Home / Self Care | Attending: Gastroenterology | Admitting: Gastroenterology

## 2023-10-16 ENCOUNTER — Other Ambulatory Visit: Payer: Self-pay

## 2023-10-16 ENCOUNTER — Ambulatory Visit (HOSPITAL_COMMUNITY): Admitting: Anesthesiology

## 2023-10-16 ENCOUNTER — Encounter (HOSPITAL_COMMUNITY)
Admission: RE | Disposition: A | Discharge status: Home / Self Care | Payer: Self-pay | Source: Home / Self Care | Attending: Gastroenterology

## 2023-10-16 DIAGNOSIS — D130 Benign neoplasm of esophagus: Secondary | ICD-10-CM | POA: Diagnosis not present

## 2023-10-16 DIAGNOSIS — K2289 Other specified disease of esophagus: Secondary | ICD-10-CM | POA: Diagnosis not present

## 2023-10-16 DIAGNOSIS — G4733 Obstructive sleep apnea (adult) (pediatric): Secondary | ICD-10-CM | POA: Diagnosis not present

## 2023-10-16 DIAGNOSIS — K449 Diaphragmatic hernia without obstruction or gangrene: Secondary | ICD-10-CM | POA: Insufficient documentation

## 2023-10-16 DIAGNOSIS — D490 Neoplasm of unspecified behavior of digestive system: Secondary | ICD-10-CM | POA: Diagnosis not present

## 2023-10-16 DIAGNOSIS — J449 Chronic obstructive pulmonary disease, unspecified: Secondary | ICD-10-CM | POA: Diagnosis not present

## 2023-10-16 DIAGNOSIS — Z87891 Personal history of nicotine dependence: Secondary | ICD-10-CM | POA: Insufficient documentation

## 2023-10-16 DIAGNOSIS — I899 Noninfective disorder of lymphatic vessels and lymph nodes, unspecified: Secondary | ICD-10-CM

## 2023-10-16 DIAGNOSIS — D378 Neoplasm of uncertain behavior of other specified digestive organs: Secondary | ICD-10-CM | POA: Insufficient documentation

## 2023-10-16 DIAGNOSIS — K219 Gastro-esophageal reflux disease without esophagitis: Secondary | ICD-10-CM | POA: Diagnosis not present

## 2023-10-16 DIAGNOSIS — K229 Disease of esophagus, unspecified: Secondary | ICD-10-CM | POA: Diagnosis not present

## 2023-10-16 DIAGNOSIS — K222 Esophageal obstruction: Secondary | ICD-10-CM | POA: Diagnosis not present

## 2023-10-16 DIAGNOSIS — G4730 Sleep apnea, unspecified: Secondary | ICD-10-CM | POA: Diagnosis not present

## 2023-10-16 DIAGNOSIS — K3189 Other diseases of stomach and duodenum: Secondary | ICD-10-CM | POA: Insufficient documentation

## 2023-10-16 DIAGNOSIS — I10 Essential (primary) hypertension: Secondary | ICD-10-CM | POA: Insufficient documentation

## 2023-10-16 SURGERY — EGD (ESOPHAGOGASTRODUODENOSCOPY)
Anesthesia: Monitor Anesthesia Care

## 2023-10-16 MED ORDER — CIPROFLOXACIN IN D5W 400 MG/200ML IV SOLN
INTRAVENOUS | Status: AC
Start: 2023-10-16 — End: 2023-10-16
  Filled 2023-10-16: qty 200

## 2023-10-16 MED ORDER — PROPOFOL 500 MG/50ML IV EMUL
INTRAVENOUS | Status: AC
Start: 2023-10-16 — End: 2023-10-16
  Filled 2023-10-16: qty 50

## 2023-10-16 MED ORDER — ONDANSETRON HCL 4 MG/2ML IJ SOLN
INTRAMUSCULAR | Status: DC | PRN
  Administered 2023-10-16: 4 mg via INTRAVENOUS

## 2023-10-16 MED ORDER — PROPOFOL 10 MG/ML IV BOLUS
INTRAVENOUS | Status: AC
Start: 2023-10-16 — End: 2023-10-16
  Filled 2023-10-16: qty 20

## 2023-10-16 MED ORDER — PROPOFOL 10 MG/ML IV BOLUS
INTRAVENOUS | Status: DC | PRN
  Administered 2023-10-16: 30 mg via INTRAVENOUS
  Administered 2023-10-16: 10 mg via INTRAVENOUS
  Administered 2023-10-16: 30 mg via INTRAVENOUS
  Administered 2023-10-16 (×2): 20 mg via INTRAVENOUS
  Administered 2023-10-16: 30 mg via INTRAVENOUS
  Administered 2023-10-16: 40 mg via INTRAVENOUS
  Administered 2023-10-16: 20 mg via INTRAVENOUS

## 2023-10-16 MED ORDER — PROPOFOL 500 MG/50ML IV EMUL
INTRAVENOUS | Status: DC | PRN
  Administered 2023-10-16: 125 ug/kg/min via INTRAVENOUS

## 2023-10-16 MED ORDER — CIPROFLOXACIN IN D5W 400 MG/200ML IV SOLN
400.0000 mg | Freq: Once | INTRAVENOUS | Status: AC
  Administered 2023-10-16: 400 mg via INTRAVENOUS

## 2023-10-16 MED ORDER — SODIUM CHLORIDE 0.9 % IV SOLN: INTRAVENOUS | Status: DC

## 2023-10-16 MED ORDER — PROPOFOL 10 MG/ML IV BOLUS
INTRAVENOUS | Status: AC
  Filled 2023-10-16: qty 20

## 2023-10-16 NOTE — Anesthesia Procedure Notes (Signed)
 Procedure Name: MAC Date/Time: 10/16/2023 11:39 AM  Performed by: Vergia Glasgow, CRNAPre-anesthesia Checklist: Patient identified, Emergency Drugs available, Suction available, Patient being monitored and Timeout performed Oxygen Delivery Method: Simple face mask Preoxygenation: POM used. Placement Confirmation: positive ETCO2

## 2023-10-16 NOTE — Anesthesia Preprocedure Evaluation (Signed)
 Anesthesia Evaluation  Patient identified by MRN, date of birth, ID band Patient awake    Reviewed: Allergy & Precautions, NPO status , Patient's Chart, lab work & pertinent test results, reviewed documented beta blocker date and time   History of Anesthesia Complications Negative for: history of anesthetic complications  Airway Mallampati: III  TM Distance: >3 FB     Dental no notable dental hx.    Pulmonary neg shortness of breath, sleep apnea and Continuous Positive Airway Pressure Ventilation , COPD, former smoker   breath sounds clear to auscultation       Cardiovascular hypertension, (-) Past MI, (-) Cardiac Stents and (-) CABG  Rhythm:Regular Rate:Normal     Neuro/Psych neg Seizures PSYCHIATRIC DISORDERS Anxiety        GI/Hepatic ,GERD  Medicated and Controlled,,(+) neg Cirrhosis        Endo/Other    Renal/GU Renal disease     Musculoskeletal   Abdominal   Peds  Hematology  (+) Blood dyscrasia, anemia   Anesthesia Other Findings   Reproductive/Obstetrics                              Anesthesia Physical Anesthesia Plan  ASA: 2  Anesthesia Plan: MAC   Post-op Pain Management:    Induction: Intravenous  PONV Risk Score and Plan: 2 and Ondansetron   Airway Management Planned: Oral ETT  Additional Equipment:   Intra-op Plan:   Post-operative Plan:   Informed Consent: I have reviewed the patients History and Physical, chart, labs and discussed the procedure including the risks, benefits and alternatives for the proposed anesthesia with the patient or authorized representative who has indicated his/her understanding and acceptance.     Dental advisory given  Plan Discussed with: CRNA  Anesthesia Plan Comments:          Anesthesia Quick Evaluation

## 2023-10-16 NOTE — Transfer of Care (Signed)
 Immediate Anesthesia Transfer of Care Note  Patient: Hayley Crawford  Procedure(s) Performed: EGD (ESOPHAGOGASTRODUODENOSCOPY) ULTRASOUND, UPPER GI TRACT, ENDOSCOPIC  Patient Location: PACU and Endoscopy Unit  Anesthesia Type:MAC  Level of Consciousness: awake, alert , and patient cooperative  Airway & Oxygen Therapy: Patient Spontanous Breathing  Post-op Assessment: Report given to RN and Post -op Vital signs reviewed and stable  Post vital signs: Reviewed and stable  Last Vitals:  Vitals Value Taken Time  BP 117/52 10/16/23 12:42  Temp 36.5 C 10/16/23 12:42  Pulse 75 10/16/23 12:43  Resp 20 10/16/23 12:43  SpO2 96 % 10/16/23 12:43  Vitals shown include unfiled device data.  Last Pain:  Vitals:   10/16/23 1242  TempSrc: Temporal  PainSc: 0-No pain         Complications: No notable events documented.

## 2023-10-16 NOTE — Op Note (Signed)
 [facility] Patient Name: Hayley Crawford Procedure Date: 10/16/2023 MRN: [id number] Attending MD: Yong Henle , MD, [id number] Date of Birth: 20-Dec-1965 CSN: [id number] Age: 58 Admit Type: Ambulatory Procedure:                Upper EUS Indications:              Esophageal deformity on endoscopy/Subepithelial                            tumor vs. extrinsic compression Providers:                Yong Henle, MD, Gabino Joe,                            Technician, Joline Ned, Technician, Golda Latch, RN Referring MD:              Medicines:                Monitored Anesthesia Care, Cipro 400 mg IV Complications:            No immediate complications. Estimated Blood Loss:     Estimated blood loss was minimal. Procedure:                Pre-Anesthesia Assessment:                           - Prior to the procedure, a History and Physical                            was performed, and patient medications and                            allergies were reviewed. The patient's tolerance of                            previous anesthesia was also reviewed. The risks                            and benefits of the procedure and the sedation                            options and risks were discussed with the patient.                            All questions were answered, and informed consent                            was obtained. Prior Anticoagulants: The patient has                            taken no anticoagulant or antiplatelet agents. ASA                            Grade Assessment:  III - A patient with severe                            systemic disease. After reviewing the risks and                            benefits, the patient was deemed in satisfactory                            condition to undergo the procedure.                           After obtaining informed consent, the endoscope was                            passed  under direct vision. Throughout the                            procedure, the patient's blood pressure, pulse, and                            oxygen saturations were monitored continuously. The                            [id number]-[id number] ([id number] Olympus endoscope was introduced                            through the mouth, and advanced to the second part                            of duodenum. After obtaining informed consent, the                            endoscope was passed under direct vision.                            Throughout the procedure, the patient's blood                            pressure, pulse, and oxygen saturations were                            monitored continuously. The GF-[id number]-[id number] ([id number])                            Olympus radial ultrasound scope was introduced                            through the mouth, and advanced to the stomach for                            ultrasound examination from the esophagus and  stomach. The upper EUS was accomplished without                            difficulty. The patient tolerated the procedure. Scope In: Scope Out: Findings:      ENDOSCOPIC FINDING: :      A small area of extrinsic compression or subepithelial lesion was found       in the mid esophagus (approximately 26 cm from incisors).      No other gross lesions were noted in the entire esophagus.      The Z-line was irregular and was found 35 cm from the incisors.      A 1 cm hiatal hernia was present.      Localized mildly erythematous mucosa without bleeding was found in the       gastric antrum.      No other gross lesions were noted in the entire examined stomach.      No gross lesions were noted in the duodenal bulb, in the first portion       of the duodenum and in the second portion of the duodenum.      ENDOSONOGRAPHIC FINDING: :      A round intramural (subepithelial) lesion was found in the thoracic       esophagus. It was encountered  at 26 cm from the incisors. The lesion was       hypoechoic. Sonographically, the origin appeared to be within the       muscularis propria (Layer 4). The mass measured up to 16 mm by 11 mm       maximal cross-sectional diameter. The endosonographic borders were       well-defined. Fine needle biopsy was performed. Color Doppler imaging       was utilized prior to needle puncture to confirm a lack of significant       vascular structures within the needle path. Seven passes were made with       the 22 gauge Acquire biopsy needle using a transesophageal approach. A       visible core of tissue was obtained. Preliminary cytologic examination       and touch preps were performed. Final cytology results are pending.      Endosonographic imaging in the rest of the esophagus showed no       intramural (subepithelial) lesion, mass or wall thickening.      Endosonographic imaging in the cardia of the stomach and in the body of       the stomach showed no wall thickening.      Endosonographic imaging in the visualized portion of the liver showed no       mass.      No malignant-appearing lymph nodes were visualized in the middle       paraesophageal mediastinum (level 33M) and lower paraesophageal       mediastinum (level 8L).      The celiac region was visualized. Impression:               EGD impression:                           - Extrinsic compression versus subepithelial lesion                            in the mid esophagus.                           -  No other gross lesions in the entire esophagus.                           - Z-line irregular, 35 cm from the incisors.                           - 1 cm hiatal hernia.                           - Erythematous mucosa in the antrum. No other gross                            lesions in the entire stomach. This has previously                            been biopsied and negative for H. pylori.                           - No gross lesions in the  duodenal bulb, in the                            first portion of the duodenum and in the second                            portion of the duodenum.                           EUS impression:                           - An intramural (subepithelial) lesion was found in                            the thoracic esophagus. It appeared to originate                            from within the muscularis propria (Layer 4).                            Cytology results are pending. 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 Fine needle                            biopsy performed.                           - No other  abnormalities noted in the rest of the                            esophagus lining.                           - No malignant-appearing lymph nodes were                            visualized in the middle paraesophageal mediastinum                            (level 58M) and lower paraesophageal mediastinum                            (level 8L). Moderate Sedation:      Not Applicable - Patient had care per Anesthesia. Recommendation:           - The patient will be observed post-procedure,                            until all discharge criteria are met.                           - Discharge patient to home.                           - Patient has a contact number available for                            emergencies. The signs and symptoms of potential                            delayed complications were discussed with the                            patient. Return to normal activities tomorrow.                            Written discharge instructions were provided to the                            patient.                           - Resume previous diet.                           - Observe patient's clinical course.                            - Await cytology results.                           - The findings and recommendations were discussed  with the patient. Procedure Code(s):        --- Professional ---                           [phone], Esophagogastroduodenoscopy, flexible,                            transoral; with transendoscopic ultrasound-guided                            intramural or transmural fine needle                            aspiration/biopsy(s), (includes endoscopic                            ultrasound examination limited to the esophagus,                            stomach or duodenum, and adjacent structures) Diagnosis Code(s):        --- Professional ---                           K22.2, Esophageal obstruction                           K22.89, Other specified disease of esophagus                           K44.9, Diaphragmatic hernia without obstruction or                            gangrene                           K31.89, Other diseases of stomach and duodenum                           I89.9, Noninfective disorder of lymphatic vessels                            and lymph nodes, unspecified CPT copyright 2022 [employer]. All rights reserved. The codes documented in this report are preliminary and upon coder review may  be revised to meet current compliance requirements. Yong Henle, MD 10/16/2023 12:58:28 PM Number of Addenda: 0

## 2023-10-16 NOTE — H&P (Signed)
 GASTROENTEROLOGY PROCEDURE H&P NOTE   Primary Care Physician: Yevette Hem, FNP  HPI: Hayley Crawford is a 58 y.o. female who presents for EGD/EUS to evaluate an esophageal lesion/nodule rule out SEL.  Past Medical History:  Diagnosis Date   B12 deficiency    COPD (chronic obstructive pulmonary disease) (HCC)    Hyperlipidemia    Hypertension    Sleep apnea    Uterine fibroid    Past Surgical History:  Procedure Laterality Date   ABDOMINAL HYSTERECTOMY     Partial   bladder stretching     CHOLECYSTECTOMY     ESOPHAGOGASTRODUODENOSCOPY N/A 08/15/2023   Procedure: EGD (ESOPHAGOGASTRODUODENOSCOPY);  Surgeon: Hargis Lias, MD;  Location: [facility];  Service: Endoscopy;  Laterality: N/A;  8:30AM;ASA 3   HEMORROIDECTOMY     TENDON REPAIR     right thumb   tumor removed     No current facility-administered medications for this encounter.   No current facility-administered medications for this encounter. Allergies  Allergen Reactions   Sulfa Antibiotics Swelling    think it caused my throat to swell   Family History  Problem Relation Age of Onset   Diabetes Other    Cancer Mother        lung   Brain cancer Mother        tumor   Social History   Socioeconomic History   Marital status: Single    Spouse name: Not on file   Number of children: Not on file   Years of education: Not on file   Highest education level: Associate degree: academic program  Occupational History   Not on file  Tobacco Use   Smoking status: Former    Current packs/day: 0.00    Average packs/day: 2.0 packs/day for 38.0 years (76.0 ttl pk-yrs)    Types: Cigarettes    Start date: 49    Quit date: 2021    Years since quitting: 4.4   Smokeless tobacco: Never  Vaping Use   Vaping status: Never Used  Substance and Sexual Activity   Alcohol use: Yes   Drug use: No   Sexual activity: Never  Other Topics Concern   Not on file  Social History Narrative   Not on file    Social Drivers of Health   Financial Resource Strain: High Risk (10/02/2023)   Overall Financial Resource Strain (CARDIA)    Difficulty of Paying Living Expenses: Hard  Food Insecurity: Food Insecurity Present (10/02/2023)   Hunger Vital Sign    Worried About Running Out of Food in the Last Year: Often true    Ran Out of Food in the Last Year: Never true  Transportation Needs: No Transportation Needs (10/02/2023)   PRAPARE - Administrator, Civil Service (Medical): No    Lack of Transportation (Non-Medical): No  Physical Activity: Insufficiently Active (10/02/2023)   Exercise Vital Sign    Days of Exercise per Week: 5 days    Minutes of Exercise per Session: 20 min  Stress: No Stress Concern Present (10/02/2023)   [name] of Occupational Health - Occupational Stress Questionnaire    Feeling of Stress : Only a little  Social Connections: Moderately Integrated (10/02/2023)   Social Connection and Isolation Panel    Frequency of Communication with Friends and Family: More than three times a week    Frequency of Social Gatherings with Friends and Family: More than three times a week    Attends Religious Services: 1  to 4 times per year    Active Member of Clubs or Organizations: No    Attends Engineer, structural: More than 4 times per year    Marital Status: Divorced  Catering manager Violence: Not on file    Physical Exam: There were no vitals filed for this visit. There is no height or weight on file to calculate BMI. GEN: NAD EYE: Sclerae anicteric ENT: MMM CV: Non-tachycardic GI: Soft, NT/ND NEURO:  Alert & Oriented x 3  Lab Results: No results for input(s): WBC, HGB, HCT, PLT in the last 72 hours. BMET No results for input(s): NA, K, CL, CO2, GLUCOSE, BUN, CREATININE, CALCIUM  in the last 72 hours. LFT No results for input(s): PROT, ALBUMIN, AST, ALT, ALKPHOS, BILITOT, BILIDIR, IBILI in the last 72  hours. PT/INR No results for input(s): LABPROT, INR in the last 72 hours.   Impression / Plan: This is a 58 y.o.female who presents for EGD/EUS to evaluate an esophageal lesion/nodule rule out SEL.  The risks of an EUS including intestinal perforation, bleeding, infection, aspiration, and medication effects were discussed as was the possibility it may not give a definitive diagnosis if a biopsy is performed.  When a biopsy of the pancreas is done as part of the EUS, there is an additional risk of pancreatitis at the rate of about 1-2%.  It was explained that procedure related pancreatitis is typically mild, although it can be severe and even life threatening, which is why we do not perform random pancreatic biopsies and only biopsy a lesion/area we feel is concerning enough to warrant the risk.   The risks and benefits of endoscopic evaluation/treatment were discussed with the patient and/or family; these include but are not limited to the risk of perforation, infection, bleeding, missed lesions, lack of diagnosis, severe illness requiring hospitalization, as well as anesthesia and sedation related illnesses.  The patient's history has been reviewed, patient examined, no change in status, and deemed stable for procedure.  The patient and/or family is agreeable to proceed.    Yong Henle, MD [facility] Office # [phone]

## 2023-10-16 NOTE — Anesthesia Postprocedure Evaluation (Signed)
 Anesthesia Post Note  Patient: Hayley Crawford  Procedure(s) Performed: EGD (ESOPHAGOGASTRODUODENOSCOPY) ULTRASOUND, UPPER GI TRACT, ENDOSCOPIC     Patient location during evaluation: PACU Anesthesia Type: MAC Level of consciousness: awake and alert Pain management: pain level controlled Vital Signs Assessment: post-procedure vital signs reviewed and stable Respiratory status: spontaneous breathing, nonlabored ventilation, respiratory function stable and patient connected to nasal cannula oxygen Cardiovascular status: stable and blood pressure returned to baseline Postop Assessment: no apparent nausea or vomiting Anesthetic complications: no   No notable events documented.  Last Vitals:  Vitals:   10/16/23 1310 10/16/23 1315  BP: (!) 145/78   Pulse: 70 68  Resp: 13 (!) 9  Temp:    SpO2: 97% 98%    Last Pain:  Vitals:   10/16/23 1310  TempSrc:   PainSc: 5                  Leslye Rast

## 2023-10-16 NOTE — Discharge Instructions (Signed)

## 2023-10-19 ENCOUNTER — Encounter (HOSPITAL_COMMUNITY): Payer: Self-pay | Admitting: Gastroenterology

## 2023-10-22 LAB — CYTOLOGY - NON PAP

## 2023-10-24 ENCOUNTER — Ambulatory Visit: Payer: Self-pay | Admitting: Gastroenterology

## 2023-11-20 DIAGNOSIS — H5203 Hypermetropia, bilateral: Secondary | ICD-10-CM | POA: Diagnosis not present

## 2023-11-20 DIAGNOSIS — H5213 Myopia, bilateral: Secondary | ICD-10-CM | POA: Diagnosis not present

## 2024-01-01 ENCOUNTER — Other Ambulatory Visit: Payer: Self-pay | Admitting: Family

## 2024-01-01 DIAGNOSIS — Z713 Dietary counseling and surveillance: Secondary | ICD-10-CM

## 2024-01-01 DIAGNOSIS — K219 Gastro-esophageal reflux disease without esophagitis: Secondary | ICD-10-CM

## 2024-01-01 DIAGNOSIS — I10 Essential (primary) hypertension: Secondary | ICD-10-CM

## 2024-01-06 ENCOUNTER — Encounter: Payer: Self-pay | Admitting: Family

## 2024-01-06 ENCOUNTER — Ambulatory Visit (INDEPENDENT_AMBULATORY_CARE_PROVIDER_SITE_OTHER): Admitting: Family

## 2024-01-06 ENCOUNTER — Encounter: Admitting: Family

## 2024-01-06 VITALS — BP 130/72 | HR 69 | Temp 97.8°F | Ht 62.0 in | Wt 226.4 lb

## 2024-01-06 DIAGNOSIS — Z87891 Personal history of nicotine dependence: Secondary | ICD-10-CM

## 2024-01-06 DIAGNOSIS — K219 Gastro-esophageal reflux disease without esophagitis: Secondary | ICD-10-CM

## 2024-01-06 DIAGNOSIS — G4733 Obstructive sleep apnea (adult) (pediatric): Secondary | ICD-10-CM

## 2024-01-06 DIAGNOSIS — I10 Essential (primary) hypertension: Secondary | ICD-10-CM | POA: Diagnosis not present

## 2024-01-06 DIAGNOSIS — J449 Chronic obstructive pulmonary disease, unspecified: Secondary | ICD-10-CM

## 2024-01-06 DIAGNOSIS — Z6841 Body Mass Index (BMI) 40.0 and over, adult: Secondary | ICD-10-CM

## 2024-01-06 DIAGNOSIS — E559 Vitamin D deficiency, unspecified: Secondary | ICD-10-CM

## 2024-01-06 DIAGNOSIS — E785 Hyperlipidemia, unspecified: Secondary | ICD-10-CM

## 2024-01-06 DIAGNOSIS — F411 Generalized anxiety disorder: Secondary | ICD-10-CM

## 2024-01-06 DIAGNOSIS — E538 Deficiency of other specified B group vitamins: Secondary | ICD-10-CM

## 2024-01-06 DIAGNOSIS — Z713 Dietary counseling and surveillance: Secondary | ICD-10-CM | POA: Diagnosis not present

## 2024-01-06 DIAGNOSIS — D509 Iron deficiency anemia, unspecified: Secondary | ICD-10-CM

## 2024-01-06 LAB — CMP14+EGFR
ALT: 27 IU/L (ref 0–32)
AST: 21 IU/L (ref 0–40)
Albumin: 4.1 g/dL (ref 3.8–4.9)
Alkaline Phosphatase: 112 IU/L (ref 44–121)
BUN/Creatinine Ratio: 22 (ref 12–28)
BUN: 19 mg/dL (ref 8–27)
Bilirubin Total: <0.2 mg/dL (ref 0.0–1.2)
CO2: 23 mmol/L (ref 20–29)
Calcium: 9.8 mg/dL (ref 8.7–10.3)
Chloride: 102 mmol/L (ref 96–106)
Creatinine, Ser: 0.87 mg/dL (ref 0.57–1.00)
Globulin, Total: 2.6 g/dL (ref 1.5–4.5)
Glucose: 82 mg/dL (ref 70–99)
Potassium: 4.2 mmol/L (ref 3.5–5.2)
Sodium: 142 mmol/L (ref 134–144)
Total Protein: 6.7 g/dL (ref 6.0–8.5)
eGFR: 76 mL/min/1.73 (ref >59)

## 2024-01-06 MED ORDER — LOSARTAN POTASSIUM 100 MG PO TABS: 100.0000 mg | ORAL_TABLET | Freq: Every day | ORAL | 3 refills | Status: AC

## 2024-01-06 MED ORDER — BUSPIRONE HCL 10 MG PO TABS: 10.0000 mg | ORAL_TABLET | Freq: Three times a day (TID) | ORAL | 1 refills | Status: AC | PRN

## 2024-01-06 MED ORDER — PROMETHAZINE HCL 25 MG PO TABS: ORAL_TABLET | ORAL | 2 refills | Status: AC

## 2024-01-06 MED ORDER — WEGOVY 1.7 MG/0.75ML ~~LOC~~ SOAJ: 1.7000 mg | SUBCUTANEOUS | 2 refills | Status: DC

## 2024-01-06 MED ORDER — ONDANSETRON HCL 4 MG PO TABS: 4.0000 mg | ORAL_TABLET | Freq: Three times a day (TID) | ORAL | 2 refills | Status: AC | PRN

## 2024-01-06 NOTE — Patient Instructions (Signed)

## 2024-01-06 NOTE — Progress Notes (Signed)
 Subjective:    Patient ID: Hayley Crawford, female    DOB: Jan 24, 1966, 58 y.o.   MRN: [id number]  Chief Complaint  Patient presents with   Medical Management of Chronic Issues   Pt presents to the office today for chronic follow up and follow up on Wegovy . She is currently taking 1.7 mg.  She has lost 51 lbs. Her starting weigh was 277 lb.      01/06/2024    8:58 AM 10/16/2023   11:05 AM 10/03/2023    2:40 PM  Last 3 Weights  Weight (lbs) 226 lb 6.4 oz 235 lb 0.2 oz 235 lb  Weight (kg) 102.694 kg 106.6 kg 106.595 kg    She has OSA and using her CPAP nightly.   She has COPD, she quit smoking in 2020. Denies any SOB at this time.  Hypertension This is a chronic problem. The current episode started more than 1 year ago. The problem has been resolved since onset. The problem is controlled. Associated symptoms include anxiety and malaise/fatigue. Pertinent negatives include no chest pain, peripheral edema or shortness of breath. Risk factors for coronary artery disease include obesity. The current treatment provides moderate improvement.  Hyperlipidemia This is a chronic problem. The current episode started more than 1 year ago. The problem is uncontrolled. Recent lipid tests were reviewed and are high. Exacerbating diseases include obesity. Pertinent negatives include no chest pain or shortness of breath. Current antihyperlipidemic treatment includes diet change. The current treatment provides no improvement of lipids. Risk factors for coronary artery disease include hypertension, a sedentary lifestyle, post-menopausal and obesity.  Gastroesophageal Reflux She complains of belching and heartburn. She reports no chest pain. This is a chronic problem. The problem occurs rarely. The symptoms are aggravated by certain foods. Risk factors include obesity. She has tried a PPI for the symptoms. The treatment provided moderate relief.  Anemia Presents for follow-up visit. Symptoms include  malaise/fatigue.  Anxiety Presents for follow-up visit. Symptoms include excessive worry, nervous/anxious behavior and restlessness. Patient reports no chest pain or shortness of breath. Symptoms occur occasionally. The severity of symptoms is mild.        Review of Systems  Constitutional:  Positive for malaise/fatigue.  Respiratory:  Negative for shortness of breath.   Cardiovascular:  Negative for chest pain.  Gastrointestinal:  Positive for heartburn.  Psychiatric/Behavioral:  The patient is nervous/anxious.   All other systems reviewed and are negative.   Social History   Socioeconomic History   Marital status: Single    Spouse name: Not on file   Number of children: Not on file   Years of education: Not on file   Highest education level: Associate degree: academic program  Occupational History   Not on file  Tobacco Use   Smoking status: Former    Current packs/day: 0.00    Average packs/day: 2.0 packs/day for 38.0 years (76.0 ttl pk-yrs)    Types: Cigarettes    Start date: 55    Quit date: 2021    Years since quitting: 4.6   Smokeless tobacco: Never  Vaping Use   Vaping status: Never Used  Substance and Sexual Activity   Alcohol use: Yes   Drug use: No   Sexual activity: Never  Other Topics Concern   Not on file  Social History Narrative   Not on file   Social Drivers of Health   Financial Resource Strain: High Risk (10/02/2023)   Overall Financial Resource Strain (CARDIA)    Difficulty  of Paying Living Expenses: Hard  Food Insecurity: Food Insecurity Present (10/02/2023)   Hunger Vital Sign    Worried About Running Out of Food in the Last Year: Often true    Ran Out of Food in the Last Year: Never true  Transportation Needs: No Transportation Needs (10/02/2023)   PRAPARE - Administrator, Civil Service (Medical): No    Lack of Transportation (Non-Medical): No  Physical Activity: Insufficiently Active (10/02/2023)   Exercise Vital Sign     Days of Exercise per Week: 5 days    Minutes of Exercise per Session: 20 min  Stress: No Stress Concern Present (10/02/2023)   [name] of Occupational Health - Occupational Stress Questionnaire    Feeling of Stress : Only a little  Social Connections: Moderately Integrated (10/02/2023)   Social Connection and Isolation Panel    Frequency of Communication with Friends and Family: More than three times a week    Frequency of Social Gatherings with Friends and Family: More than three times a week    Attends Religious Services: 1 to 4 times per year    Active Member of [employer] or Organizations: No    Attends Engineer, structural: More than 4 times per year    Marital Status: Divorced   Family History  Problem Relation Age of Onset   Diabetes Other    Cancer Mother        lung   Brain cancer Mother        tumor        Objective:   Physical Exam Vitals reviewed.  Constitutional:      General: She is not in acute distress.    Appearance: She is well-developed. She is obese.  HENT:     Head: Normocephalic and atraumatic.     Right Ear: Tympanic membrane normal.     Left Ear: Tympanic membrane normal.  Eyes:     Pupils: Pupils are equal, round, and reactive to light.  Neck:     Thyroid : No thyromegaly.  Cardiovascular:     Rate and Rhythm: Normal rate and regular rhythm.     Heart sounds: Normal heart sounds. No murmur heard. Pulmonary:     Effort: Pulmonary effort is normal. No respiratory distress.     Breath sounds: Normal breath sounds. No wheezing.  Abdominal:     General: Bowel sounds are normal. There is no distension.     Palpations: Abdomen is soft.     Tenderness: There is no abdominal tenderness.  Musculoskeletal:        General: No tenderness. Normal range of motion.     Cervical back: Normal range of motion and neck supple.  Skin:    General: Skin is warm and dry.  Neurological:     Mental Status: She is alert and oriented to person, place,  and time.     Cranial Nerves: No cranial nerve deficit.     Deep Tendon Reflexes: Reflexes are normal and symmetric.  Psychiatric:        Behavior: Behavior normal.        Thought Content: Thought content normal.        Judgment: Judgment normal.       BP 130/72   Pulse 69   Temp 97.8 F (36.6 C) (Temporal)   Ht 5' 2 (1.575 m)   Wt 226 lb 6.4 oz (102.7 kg)   LMP 05/31/2014   SpO2 100%   BMI 41.41 kg/m  Assessment & Plan:  Hayley Crawford comes in today with chief complaint of Medical Management of Chronic Issues   Diagnosis and orders addressed:  1. GAD (generalized anxiety disorder) - busPIRone  (BUSPAR ) 10 MG tablet; Take 1 tablet (10 mg total) by mouth 3 (three) times daily as needed.  Dispense: 90 tablet; Refill: 1 - CMP14+EGFR  2. Essential hypertension - losartan  (COZAAR ) 100 MG tablet; Take 1 tablet (100 mg total) by mouth daily.  Dispense: 90 tablet; Refill: 3 - CMP14+EGFR  3. Gastroesophageal reflux disease without esophagitis - promethazine  (PHENERGAN ) 25 MG tablet; TAKE 1/2 TO 1 TABLETS BY MOUTH EVERY 8 HOURS AS NEEDED FOR NAUSE OR VOMITING  Dispense: 20 tablet; Refill: 2 - CMP14+EGFR  4. Weight loss counseling, encounter for (Primary)  - CMP14+EGFR - ondansetron  (ZOFRAN ) 4 MG tablet; Take 1 tablet (4 mg total) by mouth every 8 (eight) hours as needed for nausea or vomiting.  Dispense: 30 tablet; Refill: 2 - semaglutide -weight management (WEGOVY ) 1.7 MG/0.75ML SOAJ SQ injection; Inject 1.7 mg into the skin once a week.  Dispense: 4 mL; Refill: 2  5. Hyperlipidemia, unspecified hyperlipidemia type - CMP14+EGFR  6. Vitamin B 12 deficiency - CMP14+EGFR  7. Primary hypertension  - CMP14+EGFR  8. Chronic obstructive pulmonary disease, unspecified COPD type (HCC) - CMP14+EGFR  9. Vitamin D  deficiency - CMP14+EGFR  10. Hx of smoking  - CMP14+EGFR  11. Iron deficiency anemia, unspecified iron deficiency anemia type - CMP14+EGFR  12. Morbid  obesity with BMI of 40.0-44.9, adult (HCC) - CMP14+EGFR  13. OSA (obstructive sleep apnea) - CMP14+EGFR  14. Morbid obesity (HCC) - CMP14+EGFR - semaglutide -weight management (WEGOVY ) 1.7 MG/0.75ML SOAJ SQ injection; Inject 1.7 mg into the skin once a week.  Dispense: 4 mL; Refill: 2   Continue Wegovy   1.7 mg Continue current medications  Health Maintenance reviewed Diet and exercise encouraged  Return in about 3 months (around 04/06/2024), or if symptoms worsen or fail to improve.    Bari Learn, FNP

## 2024-01-08 ENCOUNTER — Ambulatory Visit: Payer: Self-pay | Admitting: Family

## 2024-02-11 DIAGNOSIS — G4733 Obstructive sleep apnea (adult) (pediatric): Secondary | ICD-10-CM

## 2024-02-17 ENCOUNTER — Encounter: Payer: Self-pay | Admitting: Family

## 2024-02-17 ENCOUNTER — Ambulatory Visit (INDEPENDENT_AMBULATORY_CARE_PROVIDER_SITE_OTHER): Admitting: Family

## 2024-02-17 ENCOUNTER — Ambulatory Visit: Payer: Self-pay

## 2024-02-17 VITALS — BP 128/78 | HR 79 | Temp 98.0°F | Ht 62.0 in | Wt 230.4 lb

## 2024-02-17 DIAGNOSIS — R051 Acute cough: Secondary | ICD-10-CM

## 2024-02-17 DIAGNOSIS — J069 Acute upper respiratory infection, unspecified: Secondary | ICD-10-CM

## 2024-02-17 LAB — VERITOR FLU A/B WAIVED
Influenza A: NEGATIVE
Influenza B: NEGATIVE

## 2024-02-17 LAB — RAPID STREP SCREEN (MED CTR MEBANE ONLY): Strep Gp A Ag, IA W/Reflex: NEGATIVE

## 2024-02-17 LAB — CULTURE, GROUP A STREP

## 2024-02-17 MED ORDER — CETIRIZINE HCL 10 MG PO TABS: 10.0000 mg | ORAL_TABLET | Freq: Every day | ORAL | 1 refills | Status: AC

## 2024-02-17 MED ORDER — FLUTICASONE PROPIONATE 50 MCG/ACT NA SUSP: 2.0000 | Freq: Every day | NASAL | 6 refills | Status: AC

## 2024-02-17 NOTE — Telephone Encounter (Signed)
 FYI Only or Action Required?: FYI only for provider.  Patient was last seen in primary care on 01/06/2024 by Lavell Bari LABOR, FNP.  Called Nurse Triage reporting Sore Throat.  Symptoms began a week ago.  Interventions attempted: OTC medications: allergty medication and Rest, hydration, or home remedies.  Symptoms are: gradually worsening.  Triage Disposition: See PCP When Office is Open (Within 3 Days)  Patient/caregiver understands and will follow disposition?: Yes  Copied from CRM [id number]. Topic: Clinical - Red Word Triage >> Feb 17, 2024  9:29 AM Gustabo D wrote: Pt says she's sick it started last Friday she got the flu shot then. She has a sore throat, drainage and can't taste anything. Reason for Disposition  [1] Sore throat is the only symptom AND [2] present > 48 hours  Answer Assessment - Initial Assessment Questions 1. ONSET: When did the throat start hurting? (Hours or days ago)      Last week on Friday  2. SEVERITY: How bad is the sore throat? (Scale 1-10; mild, moderate or severe)     Moderate- getting worse during the day and sore to the throat  3. STREP EXPOSURE: Has there been any exposure to strep within the past week? If Yes, ask: What type of contact occurred?      Not that she is aware oh  4.  VIRAL SYMPTOMS: Are there any symptoms of a cold, such as a runny nose, cough, hoarse voice or red eyes?      Cough, postnasal drainage  5. FEVER: Do you have a fever? If Yes, ask: What is your temperature, how was it measured, and when did it start?     Unsure, but feels warm and sweaty  6. PUS ON THE TONSILS: Is there pus on the tonsils in the back of your throat?     Unsure  7. OTHER SYMPTOMS: Do you have any other symptoms? (e.g., difficulty breathing, headache, rash)     Can't taste anything  8. PREGNANCY: Is there any chance you are pregnant? When was your last menstrual period?     No  Protocols used: Sore Throat-A-AH

## 2024-02-17 NOTE — Telephone Encounter (Signed)
 Appt made.

## 2024-02-17 NOTE — Telephone Encounter (Signed)
 PATIENT IS SCHEDULED TO BE SEEN FOR THIS TODAY.

## 2024-02-17 NOTE — Patient Instructions (Signed)

## 2024-02-17 NOTE — Progress Notes (Signed)
 Subjective:    Patient ID: Hayley Crawford, female    DOB: 04/29/1966, 58 y.o.   MRN: [id number]  Chief Complaint  Patient presents with   Sore Throat   Nausea   Generalized Body Aches   Pt presents to the office today with sore throat and body aches that started last week that has worsen.  Sore Throat  This is a new problem. The current episode started 1 to 4 weeks ago. The problem has been gradually worsening. There has been no fever. The pain is at a severity of 3/10. Associated symptoms include headaches, swollen glands and trouble swallowing. Pertinent negatives include no congestion, coughing, ear discharge, ear pain or shortness of breath. She has tried acetaminophen  for the symptoms. The treatment provided mild relief.      Review of Systems  HENT:  Positive for trouble swallowing. Negative for congestion, ear discharge and ear pain.   Respiratory:  Negative for cough and shortness of breath.   Neurological:  Positive for headaches.  All other systems reviewed and are negative.   Social History   Socioeconomic History   Marital status: Single    Spouse name: Not on file   Number of children: Not on file   Years of education: Not on file   Highest education level: Associate degree: academic program  Occupational History   Not on file  Tobacco Use   Smoking status: Former    Current packs/day: 0.00    Average packs/day: 2.0 packs/day for 38.0 years (76.0 ttl pk-yrs)    Types: Cigarettes    Start date: 58    Quit date: 2021    Years since quitting: 4.7   Smokeless tobacco: Never  Vaping Use   Vaping status: Never Used  Substance and Sexual Activity   Alcohol use: Yes   Drug use: No   Sexual activity: Never  Other Topics Concern   Not on file  Social History Narrative   Not on file   Social Drivers of Health   Financial Resource Strain: High Risk (10/02/2023)   Overall Financial Resource Strain (CARDIA)    Difficulty of Paying Living Expenses: Hard  Food  Insecurity: Food Insecurity Present (10/02/2023)   Hunger Vital Sign    Worried About Running Out of Food in the Last Year: Often true    Ran Out of Food in the Last Year: Never true  Transportation Needs: No Transportation Needs (10/02/2023)   PRAPARE - Administrator, Civil Service (Medical): No    Lack of Transportation (Non-Medical): No  Physical Activity: Insufficiently Active (10/02/2023)   Exercise Vital Sign    Days of Exercise per Week: 5 days    Minutes of Exercise per Session: 20 min  Stress: No Stress Concern Present (10/02/2023)   [name] of Occupational Health - Occupational Stress Questionnaire    Feeling of Stress : Only a little  Social Connections: Moderately Integrated (10/02/2023)   Social Connection and Isolation Panel    Frequency of Communication with Friends and Family: More than three times a week    Frequency of Social Gatherings with Friends and Family: More than three times a week    Attends Religious Services: 1 to 4 times per year    Active Member of [employer] or Organizations: No    Attends Engineer, structural: More than 4 times per year    Marital Status: Divorced   Family History  Problem Relation Age of Onset   Diabetes Other  Cancer Mother        lung   Brain cancer Mother        tumor        Objective:   Physical Exam Vitals reviewed.  Constitutional:      General: She is not in acute distress.    Appearance: She is well-developed.  HENT:     Head: Normocephalic and atraumatic.     Right Ear: Tympanic membrane normal.     Left Ear: Tympanic membrane normal.     Mouth/Throat:     Pharynx: Pharyngeal swelling and posterior oropharyngeal erythema present.  Eyes:     Pupils: Pupils are equal, round, and reactive to light.  Neck:     Thyroid : No thyromegaly.  Cardiovascular:     Rate and Rhythm: Normal rate and regular rhythm.     Heart sounds: Normal heart sounds. No murmur heard. Pulmonary:     Effort:  Pulmonary effort is normal. No respiratory distress.     Breath sounds: Normal breath sounds. No wheezing.  Abdominal:     General: Bowel sounds are normal. There is no distension.     Palpations: Abdomen is soft.     Tenderness: There is no abdominal tenderness.  Musculoskeletal:        General: No tenderness. Normal range of motion.     Cervical back: Normal range of motion and neck supple.     Right lower leg: Edema (trace) present.     Left lower leg: Edema (trace) present.  Skin:    General: Skin is warm and dry.  Neurological:     Mental Status: She is alert and oriented to person, place, and time.     Cranial Nerves: No cranial nerve deficit.     Deep Tendon Reflexes: Reflexes are normal and symmetric.  Psychiatric:        Behavior: Behavior normal.        Thought Content: Thought content normal.        Judgment: Judgment normal.       BP 128/78   Pulse 79   Temp 98 F (36.7 C) (Temporal)   Ht 5' 2 (1.575 m)   Wt 230 lb 6.4 oz (104.5 kg)   LMP 05/31/2014   SpO2 93%   BMI 42.14 kg/m      Assessment & Plan:  Hayley Crawford comes in today with chief complaint of Sore Throat, Nausea, and Generalized Body Aches   Diagnosis and orders addressed:  1. Acute cough - Veritor Flu A/B Waived - Rapid Strep Screen ([facility] ONLY) - Novel Coronavirus, NAA ([facility])  2. Viral URI (Primary) - Take meds as prescribed - Use a cool mist humidifier  -Use saline nose sprays frequently -Force fluids -For any cough or congestion  Use plain Mucinex - regular strength or max strength is fine -For fever or aces or pains- take tylenol  or ibuprofen . -Throat lozenges if help -Follow up if symptoms worsen or do not improve and I will send in antibiotic.  - fluticasone  (FLONASE ) 50 MCG/ACT nasal spray; Place 2 sprays into both nostrils daily.  Dispense: 16 g; Refill: 6 - cetirizine (ZYRTEC ALLERGY) 10 MG tablet; Take 1 tablet (10 mg total) by mouth daily.  Dispense: 90 tablet;  Refill: 1   Bari Learn, FNP

## 2024-02-18 LAB — NOVEL CORONAVIRUS, NAA: SARS-CoV-2, NAA: NOT DETECTED

## 2024-02-19 ENCOUNTER — Ambulatory Visit: Payer: Self-pay | Admitting: Family

## 2024-02-20 MED ORDER — AZITHROMYCIN 250 MG PO TABS: ORAL_TABLET | ORAL | 0 refills | Status: DC

## 2024-03-31 ENCOUNTER — Other Ambulatory Visit: Payer: Self-pay | Admitting: Family

## 2024-03-31 DIAGNOSIS — I10 Essential (primary) hypertension: Secondary | ICD-10-CM

## 2024-03-31 DIAGNOSIS — M5440 Lumbago with sciatica, unspecified side: Secondary | ICD-10-CM

## 2024-04-13 ENCOUNTER — Encounter: Payer: Self-pay | Admitting: Family

## 2024-04-13 ENCOUNTER — Ambulatory Visit: Admitting: Family

## 2024-04-13 ENCOUNTER — Ambulatory Visit: Payer: Self-pay | Admitting: Family

## 2024-04-13 VITALS — BP 120/70 | HR 87 | Temp 97.9°F | Ht 62.0 in | Wt 220.6 lb

## 2024-04-13 DIAGNOSIS — E538 Deficiency of other specified B group vitamins: Secondary | ICD-10-CM

## 2024-04-13 DIAGNOSIS — F411 Generalized anxiety disorder: Secondary | ICD-10-CM

## 2024-04-13 DIAGNOSIS — E559 Vitamin D deficiency, unspecified: Secondary | ICD-10-CM

## 2024-04-13 DIAGNOSIS — K219 Gastro-esophageal reflux disease without esophagitis: Secondary | ICD-10-CM

## 2024-04-13 DIAGNOSIS — J449 Chronic obstructive pulmonary disease, unspecified: Secondary | ICD-10-CM

## 2024-04-13 DIAGNOSIS — T466X5A Adverse effect of antihyperlipidemic and antiarteriosclerotic drugs, initial encounter: Secondary | ICD-10-CM

## 2024-04-13 DIAGNOSIS — E785 Hyperlipidemia, unspecified: Secondary | ICD-10-CM

## 2024-04-13 DIAGNOSIS — D509 Iron deficiency anemia, unspecified: Secondary | ICD-10-CM

## 2024-04-13 DIAGNOSIS — G4733 Obstructive sleep apnea (adult) (pediatric): Secondary | ICD-10-CM

## 2024-04-13 DIAGNOSIS — I10 Essential (primary) hypertension: Secondary | ICD-10-CM

## 2024-04-13 NOTE — Progress Notes (Signed)
 Subjective:    Patient ID: Hayley Crawford, female    DOB: 1965/07/03, 58 y.o.   MRN: [id number]  Chief Complaint  Patient presents with   Medical Management of Chronic Issues    Patient states she has found a knot in abdomen that is tender too touch and feels it mainly when laying down.    Pt presents to the office today for chronic follow up and follow up on Wegovy . She is currently taking 1.7 mg.  She has lost 57 lbs. Her starting weigh was 277 lb.      04/13/2024    1:54 PM 02/17/2024    1:58 PM 01/06/2024    8:58 AM  Last 3 Weights  Weight (lbs) 220 lb 9.6 oz 230 lb 6.4 oz 226 lb 6.4 oz  Weight (kg) 100.064 kg 104.509 kg 102.694 kg    She has OSA and using her CPAP nightly.   Can not tolerate statin because of myalgia.   She has COPD, she quit smoking in 2020. Denies any SOB at this time.   Reports she has found a spot in her epigastric area that she noticed two weeks.  Hypertension This is a chronic problem. The current episode started more than 1 year ago. The problem has been resolved since onset. The problem is controlled. Associated symptoms include anxiety and malaise/fatigue. Pertinent negatives include no chest pain, peripheral edema or shortness of breath. Risk factors for coronary artery disease include obesity. The current treatment provides moderate improvement.  Hyperlipidemia This is a chronic problem. The current episode started more than 1 year ago. The problem is uncontrolled. Recent lipid tests were reviewed and are high. Exacerbating diseases include obesity. Pertinent negatives include no chest pain or shortness of breath. Current antihyperlipidemic treatment includes diet change. The current treatment provides no improvement of lipids. Risk factors for coronary artery disease include hypertension, a sedentary lifestyle, post-menopausal and obesity.  Gastroesophageal Reflux She complains of belching and heartburn. She reports no chest pain. This is a chronic  problem. The problem occurs rarely. The symptoms are aggravated by certain foods. Risk factors include obesity. She has tried a PPI for the symptoms. The treatment provided moderate relief.  Anemia Presents for follow-up visit. Symptoms include malaise/fatigue.  Anxiety Presents for follow-up visit. Symptoms include excessive worry, nervous/anxious behavior and restlessness. Patient reports no chest pain or shortness of breath. Symptoms occur occasionally. The severity of symptoms is mild.        Review of Systems  Constitutional:  Positive for malaise/fatigue.  Respiratory:  Negative for shortness of breath.   Cardiovascular:  Negative for chest pain.  Gastrointestinal:  Positive for heartburn.  Psychiatric/Behavioral:  The patient is nervous/anxious.   All other systems reviewed and are negative.   Social History   Socioeconomic History   Marital status: Single    Spouse name: Not on file   Number of children: Not on file   Years of education: Not on file   Highest education level: Associate degree: academic program  Occupational History   Not on file  Tobacco Use   Smoking status: Former    Current packs/day: 0.00    Average packs/day: 2.0 packs/day for 38.0 years (76.0 ttl pk-yrs)    Types: Cigarettes    Start date: 61    Quit date: 2021    Years since quitting: 4.9   Smokeless tobacco: Never  Vaping Use   Vaping status: Never Used  Substance and Sexual Activity   Alcohol use:  Yes   Drug use: No   Sexual activity: Never  Other Topics Concern   Not on file  Social History Narrative   Not on file   Social Drivers of Health   Financial Resource Strain: Low Risk  (04/12/2024)   Overall Financial Resource Strain (CARDIA)    Difficulty of Paying Living Expenses: Not very hard  Food Insecurity: No Food Insecurity (04/12/2024)   Hunger Vital Sign    Worried About Running Out of Food in the Last Year: Never true    Ran Out of Food in the Last Year: Never true   Transportation Needs: No Transportation Needs (04/12/2024)   PRAPARE - Administrator, Civil Service (Medical): No    Lack of Transportation (Non-Medical): No  Physical Activity: Insufficiently Active (04/12/2024)   Exercise Vital Sign    Days of Exercise per Week: 4 days    Minutes of Exercise per Session: 30 min  Stress: Stress Concern Present (04/12/2024)   [name] of Occupational Health - Occupational Stress Questionnaire    Feeling of Stress: To some extent  Social Connections: Moderately Integrated (04/12/2024)   Social Connection and Isolation Panel    Frequency of Communication with Friends and Family: More than three times a week    Frequency of Social Gatherings with Friends and Family: More than three times a week    Attends Religious Services: More than 4 times per year    Active Member of [employer] or Organizations: Yes    Attends Engineer, Structural: More than 4 times per year    Marital Status: Divorced   Family History  Problem Relation Age of Onset   Diabetes Other    Cancer Mother        lung   Brain cancer Mother        tumor        Objective:   Physical Exam Vitals reviewed.  Constitutional:      General: She is not in acute distress.    Appearance: She is well-developed. She is obese.  HENT:     Head: Normocephalic and atraumatic.     Right Ear: Tympanic membrane normal.     Left Ear: Tympanic membrane normal.  Eyes:     Pupils: Pupils are equal, round, and reactive to light.  Neck:     Thyroid : No thyromegaly.  Cardiovascular:     Rate and Rhythm: Normal rate and regular rhythm.     Heart sounds: Normal heart sounds. No murmur heard. Pulmonary:     Effort: Pulmonary effort is normal. No respiratory distress.     Breath sounds: Normal breath sounds. No wheezing.  Abdominal:     General: Bowel sounds are normal. There is no distension.     Palpations: Abdomen is soft.     Tenderness: There is no abdominal tenderness.   Musculoskeletal:        General: No tenderness. Normal range of motion.     Cervical back: Normal range of motion and neck supple.  Skin:    General: Skin is warm and dry.  Neurological:     Mental Status: She is alert and oriented to person, place, and time.     Cranial Nerves: No cranial nerve deficit.     Deep Tendon Reflexes: Reflexes are normal and symmetric.  Psychiatric:        Behavior: Behavior normal.        Thought Content: Thought content normal.  Judgment: Judgment normal.       BP 120/70   Pulse 87   Temp 97.9 F (36.6 C) (Temporal)   Ht 5' 2 (1.575 m)   Wt 220 lb 9.6 oz (100.1 kg)   LMP 05/31/2014   BMI 40.35 kg/m      Assessment & Plan:  Hayley Crawford comes in today with chief complaint of Medical Management of Chronic Issues (Patient states she has found a knot in abdomen that is tender too touch and feels it mainly when laying down. )   Diagnosis and orders addressed:  1. Chronic obstructive pulmonary disease, unspecified COPD type (HCC) (Primary) Continue Wegovy  and exercise   2. GAD (generalized anxiety disorder) Stress management   3. Gastroesophageal reflux disease, unspecified whether esophagitis present -Diet discussed- Avoid fried, spicy, citrus foods, caffeine and alcohol -Do not eat 2-3 hours before bedtime -Encouraged small frequent meals -Avoid NSAID's  4. Hyperlipidemia, unspecified hyperlipidemia type Low fat diet   5. Primary hypertension Continue medications   6. Iron deficiency anemia, unspecified iron deficiency anemia type Iron rich diet  7. Morbid obesity with BMI of 40.0-44.9, adult (HCC) Encourage healthy diet and exercise   8. OSA (obstructive sleep apnea) Continue to wear CPAP  9. Vitamin B 12 deficiency   10. Vitamin D  deficiency  11. Myalgia due to statin Low fat diet    Continue Wegovy   1.7 mg Continue current medications  Health Maintenance reviewed Diet and exercise encouraged  Return in  about 3 months (around 07/12/2024), or if symptoms worsen or fail to improve.    Bari Learn, FNP

## 2024-04-13 NOTE — Patient Instructions (Signed)

## 2024-04-19 ENCOUNTER — Other Ambulatory Visit: Payer: Self-pay | Admitting: Family

## 2024-04-19 DIAGNOSIS — K219 Gastro-esophageal reflux disease without esophagitis: Secondary | ICD-10-CM

## 2024-04-26 ENCOUNTER — Other Ambulatory Visit: Payer: Self-pay | Admitting: Family

## 2024-04-26 DIAGNOSIS — Z713 Dietary counseling and surveillance: Secondary | ICD-10-CM

## 2024-05-24 ENCOUNTER — Other Ambulatory Visit: Payer: Self-pay | Admitting: Family

## 2024-05-24 DIAGNOSIS — Z713 Dietary counseling and surveillance: Secondary | ICD-10-CM

## 2024-05-31 ENCOUNTER — Ambulatory Visit: Admitting: Family

## 2024-06-03 ENCOUNTER — Encounter: Payer: Self-pay | Admitting: Family

## 2024-06-03 ENCOUNTER — Ambulatory Visit (INDEPENDENT_AMBULATORY_CARE_PROVIDER_SITE_OTHER)

## 2024-06-03 ENCOUNTER — Ambulatory Visit: Admitting: Family

## 2024-06-03 ENCOUNTER — Ambulatory Visit: Payer: Self-pay | Admitting: Family

## 2024-06-03 VITALS — BP 144/82 | HR 76 | Temp 98.6°F | Ht 62.0 in | Wt 227.0 lb

## 2024-06-03 DIAGNOSIS — R10A1 Flank pain, right side: Secondary | ICD-10-CM

## 2024-06-03 DIAGNOSIS — M5412 Radiculopathy, cervical region: Secondary | ICD-10-CM

## 2024-06-03 DIAGNOSIS — N3000 Acute cystitis without hematuria: Secondary | ICD-10-CM | POA: Diagnosis not present

## 2024-06-03 LAB — MICROSCOPIC EXAMINATION
Epithelial Cells (non renal): >10 /HPF — AB (ref 0–10)
RBC, Urine: NONE SEEN /HPF (ref 0–2)
Renal Epithel, UA: NONE SEEN /HPF
Yeast, UA: NONE SEEN

## 2024-06-03 LAB — URINALYSIS, ROUTINE W REFLEX MICROSCOPIC
Bilirubin, UA: NEGATIVE
Glucose, UA: NEGATIVE
Ketones, UA: NEGATIVE
Nitrite, UA: NEGATIVE
RBC, UA: NEGATIVE
Specific Gravity, UA: 1.025 (ref 1.005–1.030)
Urobilinogen, Ur: 0.2 mg/dL (ref 0.2–1.0)
pH, UA: 6 (ref 5.0–7.5)

## 2024-06-03 MED ORDER — PREDNISONE 10 MG (21) PO TBPK: ORAL_TABLET | ORAL | 0 refills | Status: AC

## 2024-06-03 MED ORDER — CEPHALEXIN 500 MG PO CAPS: 500.0000 mg | ORAL_CAPSULE | Freq: Two times a day (BID) | ORAL | 0 refills | Status: AC

## 2024-06-03 MED ORDER — DICLOFENAC SODIUM 75 MG PO TBEC: 75.0000 mg | DELAYED_RELEASE_TABLET | Freq: Two times a day (BID) | ORAL | 2 refills | Status: AC

## 2024-06-03 NOTE — Patient Instructions (Signed)
Cervical Radiculopathy  Cervical radiculopathy happens when a nerve in the neck (a cervical nerve) is pinched or bruised. This condition can happen because of an injury to the cervical spine (vertebrae) in the neck, or as part of the normal aging process. Pressure on the cervical nerves can cause pain or numbness that travels from the neck all the way down to the arm and fingers. This condition usually gets better with rest. Treatment may be needed if the condition does not improve. What are the causes? This condition may be caused by: A neck injury. A bulging (herniated) disk. Muscle spasms. Muscle tightness in the neck due to overuse. Arthritis. Breakdown or degeneration in the bones and joints of the spine (spondylosis) due to aging. Bone spurs that may develop near the cervical nerves. What are the signs or symptoms? Symptoms of this condition include: Pain. The pain may travel from the neck to the arm and hand. The pain can be severe or irritating. It may get worse when you move your neck. Numbness or tingling in your arm or hand. Weakness in the affected arm and hand, in severe cases. How is this diagnosed? This condition may be diagnosed based on your symptoms, your medical history, and a physical exam. You may also have tests, including: X-rays. CT scan. MRI. Electromyogram (EMG). Nerve conduction tests. How is this treated? In many cases, treatment is not needed for this condition. With rest, the condition usually gets better over time. If treatment is needed, options may include: Wearing a soft neck collar (cervical collar) for short periods of time. Doing physical therapy to strengthen your neck muscles. Taking medicines. These may include NSAIDs, such as ibuprofen, or oral corticosteroids. Having spinal injections, in severe cases. Having surgery. This may be needed if other treatments do not help. Different types of surgery may be done depending on the cause of this  condition. Follow these instructions at home: If you have a cervical collar: Wear it as told by your health care provider. Remove it only as told by your health care provider. Ask your health care provider if you can remove the cervical collar for cleaning and bathing. If you are allowed to remove the collar for cleaning or bathing: Follow instructions from your health care provider about how to remove the collar safely. Clean the collar by wiping it with mild soap and water and drying it completely. Take out any removable pads in the collar every 1-2 days, and wash them by hand with soap and water. Let them air-dry completely before you put them back in the collar. Check your skin under the collar for irritation or sores. If you see any, tell your health care provider. Managing pain     Take over-the-counter and prescription medicines only as told by your health care provider. If directed, put ice on the affected area. To do this: If you have a soft neck collar, remove it as told by your health care provider. Put ice in a plastic bag. Place a towel between your skin and the bag. Leave the ice on for 20 minutes, 2-3 times a day. Remove the ice if your skin turns bright red. This is very important. If you cannot feel pain, heat, or cold, you have a greater risk of damage to the area. If applying ice does not help, you can try using heat. Use the heat source that your health care provider recommends, such as a moist heat pack or a heating pad. Place a towel between   your skin and the heat source. Leave the heat on for 20-30 minutes. Remove the heat if your skin turns bright red. This is especially important if you are unable to feel pain, heat, or cold. You have a greater risk of getting burned. Try a gentle neck and shoulder massage to help relieve symptoms. Activity Rest as needed. Return to your normal activities as told by your health care provider. Ask your health care provider what  activities are safe for you. Do stretching and strengthening exercises as told by your health care provider or your physical therapist. You may have to avoid lifting. Ask your health care provider how much you can safely lift. General instructions Use a flat pillow when you sleep. Do not drive while wearing a cervical collar. If you do not have a cervical collar, ask your health care provider if it is safe to drive while your neck heals. Ask your health care provider if the medicine prescribed to you requires you to avoid driving or using machinery. Do not use any products that contain nicotine or tobacco. These products include cigarettes, chewing tobacco, and vaping devices, such as e-cigarettes. If you need help quitting, ask your health care provider. Keep all follow-up visits. This is important. Contact a health care provider if: Your condition does not improve with treatment. Get help right away if: Your pain gets much worse and is not controlled with medicines. You have weakness or numbness in your hand, arm, face, or leg. You have a high fever. You have a stiff, rigid neck. You lose control of your bowels or your bladder (have incontinence). You have trouble with walking, balance, or speaking. Summary Cervical radiculopathy happens when a nerve in the neck is pinched or bruised. A nerve can get pinched from a bulging disk, arthritis, muscle spasms, or an injury to the neck. Symptoms include pain, tingling, or numbness radiating from the neck to the arm or hand. Weakness can also occur in severe cases. Treatment may include rest, wearing a cervical collar, and physical therapy. Medicines may be prescribed to help with pain. In severe cases, injections or surgery may be needed. This information is not intended to replace advice given to you by your health care provider. Make sure you discuss any questions you have with your health care provider. Document Revised: 10/26/2020 Document  Reviewed: 10/26/2020 Elsevier Patient Education  2024 Elsevier Inc.  

## 2024-06-03 NOTE — Progress Notes (Signed)
 "  Subjective:    Patient ID: Hayley Crawford, female    DOB: December 21, 1965, 59 y.o.   MRN: [id number]  Chief Complaint  Patient presents with   Flank Pain   Pt presents to the office today with right flank and abdominal pain that started last week. Denies any dysuria or fever.  Flank Pain This is a new problem. The current episode started 1 to 4 weeks ago. The problem occurs constantly. The problem is unchanged. Pain location: right flank. The quality of the pain is described as aching. Radiates to: RUQ. The pain is at a severity of 3/10 (when standing it can be a 6). Associated symptoms include numbness and tingling. Pertinent negatives include no dysuria, fever, headaches, pelvic pain or weakness. Risk factors include obesity. She has tried bed rest for the symptoms. The treatment provided mild relief.  Shoulder Pain  The pain is present in the left shoulder. This is a new problem. The current episode started more than 1 month ago. There has been a history of trauma. The problem occurs intermittently. The problem has been waxing and waning. The quality of the pain is described as aching. The pain is at a severity of 5/10. The pain is moderate. Associated symptoms include a limited range of motion, numbness, stiffness and tingling. Pertinent negatives include no fever. She has tried rest and NSAIDS for the symptoms. The treatment provided mild relief.      Review of Systems  Constitutional:  Negative for fever.  Genitourinary:  Positive for flank pain. Negative for dysuria and pelvic pain.  Musculoskeletal:  Positive for stiffness.  Neurological:  Positive for tingling and numbness. Negative for weakness and headaches.  All other systems reviewed and are negative.   Social History   Socioeconomic History   Marital status: Single    Spouse name: Not on file   Number of children: Not on file   Years of education: Not on file   Highest education level: Associate degree: academic program   Occupational History   Not on file  Tobacco Use   Smoking status: Former    Current packs/day: 0.00    Average packs/day: 2.0 packs/day for 38.0 years (76.0 ttl pk-yrs)    Types: Cigarettes    Start date: 44    Quit date: 2021    Years since quitting: 5.0   Smokeless tobacco: Never  Vaping Use   Vaping status: Never Used  Substance and Sexual Activity   Alcohol use: Yes   Drug use: No   Sexual activity: Never  Other Topics Concern   Not on file  Social History Narrative   Not on file   Social Drivers of Health   Tobacco Use: Medium Risk (06/03/2024)   Patient History    Smoking Tobacco Use: Former    Smokeless Tobacco Use: Never    Passive Exposure: Not on Actuary Strain: Low Risk (04/12/2024)   Overall Financial Resource Strain (CARDIA)    Difficulty of Paying Living Expenses: Not very hard  Food Insecurity: No Food Insecurity (04/12/2024)   Epic    Worried About Radiation Protection Practitioner of Food in the Last Year: Never true    Ran Out of Food in the Last Year: Never true  Transportation Needs: No Transportation Needs (04/12/2024)   Epic    Lack of Transportation (Medical): No    Lack of Transportation (Non-Medical): No  Physical Activity: Insufficiently Active (04/12/2024)   Exercise Vital Sign    Days of Exercise  per Week: 4 days    Minutes of Exercise per Session: 30 min  Stress: Stress Concern Present (04/12/2024)   [name] of Occupational Health - Occupational Stress Questionnaire    Feeling of Stress: To some extent  Social Connections: Moderately Integrated (04/12/2024)   Social Connection and Isolation Panel    Frequency of Communication with Friends and Family: More than three times a week    Frequency of Social Gatherings with Friends and Family: More than three times a week    Attends Religious Services: More than 4 times per year    Active Member of Clubs or Organizations: Yes    Attends Banker Meetings: More than 4 times per  year    Marital Status: Divorced  Depression (PHQ2-9): Low Risk (06/03/2024)   Depression (PHQ2-9)    PHQ-2 Score: 0  Alcohol Screen: Low Risk (04/12/2024)   Alcohol Screen    Last Alcohol Screening Score (AUDIT): 6  Housing: Low Risk (04/12/2024)   Epic    Unable to Pay for Housing in the Last Year: No    Number of Times Moved in the Last Year: 0    Homeless in the Last Year: No  Utilities: Not on file  Health Literacy: Not on file   Family History  Problem Relation Age of Onset   Diabetes Other    Cancer Mother        lung   Brain cancer Mother        tumor        Objective:   Physical Exam Vitals reviewed.  Constitutional:      General: She is not in acute distress.    Appearance: She is well-developed. She is obese.  HENT:     Head: Normocephalic and atraumatic.  Eyes:     Pupils: Pupils are equal, round, and reactive to light.  Neck:     Thyroid : No thyromegaly.  Cardiovascular:     Rate and Rhythm: Normal rate and regular rhythm.     Heart sounds: Normal heart sounds. No murmur heard. Pulmonary:     Effort: Pulmonary effort is normal. No respiratory distress.     Breath sounds: Normal breath sounds. No wheezing.  Abdominal:     General: Bowel sounds are normal. There is no distension.     Palpations: Abdomen is soft.     Tenderness: There is no abdominal tenderness. There is no right CVA tenderness or left CVA tenderness.  Musculoskeletal:        General: Tenderness present.     Cervical back: Normal range of motion and neck supple.     Comments: Pain in left shoulder with abduction.   Skin:    General: Skin is warm and dry.  Neurological:     Mental Status: She is alert and oriented to person, place, and time.     Cranial Nerves: No cranial nerve deficit.     Deep Tendon Reflexes: Reflexes are normal and symmetric.  Psychiatric:        Behavior: Behavior normal.        Thought Content: Thought content normal.        Judgment: Judgment normal.       BP (!) 144/82   Pulse 76   Temp 98.6 F (37 C)   Ht 5' 2 (1.575 m)   Wt 227 lb (103 kg)   LMP 05/31/2014   SpO2 96%   BMI 41.52 kg/m      Assessment & Plan:  Hayley Crawford comes in today with chief complaint of Flank Pain   Diagnosis and orders addressed:  1. Right flank pain (Primary) - Urinalysis, Routine w reflex microscopic - DG Abd 1 View; Future  2. Cervical radiculopathy Start diclofenac  BID with food  Start prednisone   ROM exercises  - predniSONE  (STERAPRED UNI-PAK 21 TAB) 10 MG (21) TBPK tablet; Use as directed  Dispense: 21 tablet; Refill: 0 - diclofenac  (VOLTAREN ) 75 MG EC tablet; Take 1 tablet (75 mg total) by mouth 2 (two) times daily.  Dispense: 60 tablet; Refill: 2  3. Acute cystitis without hematuria Force fluids AZO over the counter X2 days RTO prn Culture pending - cephALEXin  (KEFLEX ) 500 MG capsule; Take 1 capsule (500 mg total) by mouth 2 (two) times daily.  Dispense: 14 capsule; Refill: 0   Start Keflex  BID  Start prednisone  Start diclofenac  BID with food, no other NSAID's   Follow up if symptoms worsen or do not improve     Bari Learn, FNP   "

## 2024-06-22 ENCOUNTER — Ambulatory Visit (HOSPITAL_COMMUNITY)

## 2024-07-13 ENCOUNTER — Ambulatory Visit: Admitting: Family
